# Patient Record
Sex: Female | Born: 1975 | Race: Black or African American | Hispanic: No | Marital: Single | Smoking: Never smoker
Health system: Southern US, Community
[De-identification: ages and names within clinical notes are randomized; demographics above are authoritative.]

## PROBLEM LIST (undated history)

## (undated) DIAGNOSIS — I10 Essential (primary) hypertension: Secondary | ICD-10-CM

## (undated) DIAGNOSIS — O139 Gestational [pregnancy-induced] hypertension without significant proteinuria, unspecified trimester: Secondary | ICD-10-CM

## (undated) DIAGNOSIS — K219 Gastro-esophageal reflux disease without esophagitis: Secondary | ICD-10-CM

## (undated) DIAGNOSIS — I480 Paroxysmal atrial fibrillation: Secondary | ICD-10-CM

## (undated) DIAGNOSIS — R002 Palpitations: Secondary | ICD-10-CM

## (undated) DIAGNOSIS — R569 Unspecified convulsions: Secondary | ICD-10-CM

## (undated) DIAGNOSIS — I712 Thoracic aortic aneurysm, without rupture: Secondary | ICD-10-CM

## (undated) DIAGNOSIS — E876 Hypokalemia: Secondary | ICD-10-CM

## (undated) DIAGNOSIS — E669 Obesity, unspecified: Secondary | ICD-10-CM

## (undated) DIAGNOSIS — D219 Benign neoplasm of connective and other soft tissue, unspecified: Secondary | ICD-10-CM

## (undated) DIAGNOSIS — I7121 Aneurysm of the ascending aorta, without rupture: Secondary | ICD-10-CM

## (undated) DIAGNOSIS — I493 Ventricular premature depolarization: Secondary | ICD-10-CM

## (undated) DIAGNOSIS — IMO0002 Reserved for concepts with insufficient information to code with codable children: Secondary | ICD-10-CM

## (undated) DIAGNOSIS — I7789 Other specified disorders of arteries and arterioles: Secondary | ICD-10-CM

## (undated) DIAGNOSIS — R87619 Unspecified abnormal cytological findings in specimens from cervix uteri: Secondary | ICD-10-CM

## (undated) DIAGNOSIS — D649 Anemia, unspecified: Secondary | ICD-10-CM

## (undated) HISTORY — DX: Palpitations: R00.2

## (undated) HISTORY — DX: Paroxysmal atrial fibrillation: I48.0

## (undated) HISTORY — DX: Other specified disorders of arteries and arterioles: I77.89

## (undated) HISTORY — DX: Unspecified convulsions: R56.9

## (undated) HISTORY — DX: Thoracic aortic aneurysm, without rupture: I71.2

## (undated) HISTORY — PX: COLPOSCOPY: SHX161

## (undated) HISTORY — DX: Ventricular premature depolarization: I49.3

## (undated) HISTORY — DX: Obesity, unspecified: E66.9

## (undated) HISTORY — DX: Gastro-esophageal reflux disease without esophagitis: K21.9

## (undated) HISTORY — DX: Aneurysm of the ascending aorta, without rupture: I71.21

## (undated) HISTORY — DX: Hypokalemia: E87.6

## (undated) HISTORY — PX: TUBAL LIGATION: SHX77

## (undated) HISTORY — PX: OOPHORECTOMY: SHX86

---

## 2000-05-22 ENCOUNTER — Encounter: Admission: RE | Admit: 2000-05-22 | Discharge: 2000-05-31 | Payer: Self-pay | Admitting: *Deleted

## 2001-07-09 ENCOUNTER — Encounter: Admission: RE | Admit: 2001-07-09 | Discharge: 2001-08-09 | Payer: Self-pay | Admitting: *Deleted

## 2003-09-17 ENCOUNTER — Emergency Department (HOSPITAL_COMMUNITY): Admission: EM | Admit: 2003-09-17 | Discharge: 2003-09-18 | Payer: Self-pay | Admitting: Emergency Medicine

## 2003-09-26 ENCOUNTER — Emergency Department (HOSPITAL_COMMUNITY): Admission: EM | Admit: 2003-09-26 | Discharge: 2003-09-26 | Payer: Self-pay | Admitting: Emergency Medicine

## 2003-10-31 ENCOUNTER — Emergency Department (HOSPITAL_COMMUNITY): Admission: EM | Admit: 2003-10-31 | Discharge: 2003-10-31 | Payer: Self-pay | Admitting: Emergency Medicine

## 2004-02-03 ENCOUNTER — Other Ambulatory Visit: Admission: RE | Admit: 2004-02-03 | Discharge: 2004-02-03 | Payer: Self-pay

## 2005-01-01 ENCOUNTER — Emergency Department (HOSPITAL_COMMUNITY): Admission: EM | Admit: 2005-01-01 | Discharge: 2005-01-01 | Payer: Self-pay | Admitting: Emergency Medicine

## 2005-03-03 ENCOUNTER — Emergency Department (HOSPITAL_COMMUNITY): Admission: EM | Admit: 2005-03-03 | Discharge: 2005-03-03 | Payer: Self-pay | Admitting: Emergency Medicine

## 2005-04-24 ENCOUNTER — Emergency Department (HOSPITAL_COMMUNITY): Admission: EM | Admit: 2005-04-24 | Discharge: 2005-04-25 | Payer: Self-pay | Admitting: *Deleted

## 2005-04-25 ENCOUNTER — Ambulatory Visit (HOSPITAL_COMMUNITY): Admission: RE | Admit: 2005-04-25 | Discharge: 2005-04-25 | Payer: Self-pay | Admitting: *Deleted

## 2005-05-20 ENCOUNTER — Inpatient Hospital Stay (HOSPITAL_COMMUNITY): Admission: AD | Admit: 2005-05-20 | Discharge: 2005-05-21 | Payer: Self-pay | Admitting: Obstetrics and Gynecology

## 2005-06-22 ENCOUNTER — Ambulatory Visit: Payer: Self-pay | Admitting: Cardiology

## 2005-07-01 ENCOUNTER — Ambulatory Visit: Payer: Self-pay | Admitting: Cardiology

## 2005-07-06 ENCOUNTER — Observation Stay (HOSPITAL_COMMUNITY): Admission: EM | Admit: 2005-07-06 | Discharge: 2005-07-08 | Payer: Self-pay | Admitting: Emergency Medicine

## 2005-07-08 ENCOUNTER — Ambulatory Visit: Payer: Self-pay | Admitting: Cardiology

## 2005-07-18 ENCOUNTER — Ambulatory Visit: Payer: Self-pay | Admitting: Cardiology

## 2005-07-20 ENCOUNTER — Emergency Department (HOSPITAL_COMMUNITY): Admission: EM | Admit: 2005-07-20 | Discharge: 2005-07-20 | Payer: Self-pay | Admitting: Emergency Medicine

## 2005-08-24 ENCOUNTER — Ambulatory Visit: Payer: Self-pay | Admitting: Cardiology

## 2005-12-27 ENCOUNTER — Ambulatory Visit: Payer: Self-pay | Admitting: Cardiology

## 2006-01-01 ENCOUNTER — Emergency Department (HOSPITAL_COMMUNITY): Admission: EM | Admit: 2006-01-01 | Discharge: 2006-01-01 | Payer: Self-pay | Admitting: Emergency Medicine

## 2006-02-15 ENCOUNTER — Ambulatory Visit: Payer: Self-pay | Admitting: Cardiology

## 2006-06-18 ENCOUNTER — Emergency Department (HOSPITAL_COMMUNITY): Admission: EM | Admit: 2006-06-18 | Discharge: 2006-06-18 | Payer: Self-pay | Admitting: Family Medicine

## 2006-08-30 ENCOUNTER — Emergency Department (HOSPITAL_COMMUNITY): Admission: EM | Admit: 2006-08-30 | Discharge: 2006-08-30 | Payer: Self-pay | Admitting: Emergency Medicine

## 2006-09-17 ENCOUNTER — Emergency Department (HOSPITAL_COMMUNITY): Admission: EM | Admit: 2006-09-17 | Discharge: 2006-09-18 | Payer: Self-pay | Admitting: Emergency Medicine

## 2006-10-18 ENCOUNTER — Emergency Department (HOSPITAL_COMMUNITY): Admission: EM | Admit: 2006-10-18 | Discharge: 2006-10-18 | Payer: Self-pay | Admitting: Emergency Medicine

## 2006-10-20 ENCOUNTER — Emergency Department (HOSPITAL_COMMUNITY): Admission: EM | Admit: 2006-10-20 | Discharge: 2006-10-20 | Payer: Self-pay | Admitting: Emergency Medicine

## 2006-10-22 ENCOUNTER — Emergency Department (HOSPITAL_COMMUNITY): Admission: EM | Admit: 2006-10-22 | Discharge: 2006-10-22 | Payer: Self-pay | Admitting: Emergency Medicine

## 2007-03-06 ENCOUNTER — Emergency Department (HOSPITAL_COMMUNITY): Admission: EM | Admit: 2007-03-06 | Discharge: 2007-03-06 | Payer: Self-pay | Admitting: Emergency Medicine

## 2007-04-14 ENCOUNTER — Emergency Department (HOSPITAL_COMMUNITY): Admission: EM | Admit: 2007-04-14 | Discharge: 2007-04-14 | Payer: Self-pay | Admitting: Family Medicine

## 2007-04-18 ENCOUNTER — Emergency Department (HOSPITAL_COMMUNITY): Admission: EM | Admit: 2007-04-18 | Discharge: 2007-04-18 | Payer: Self-pay | Admitting: Emergency Medicine

## 2007-06-15 ENCOUNTER — Emergency Department (HOSPITAL_COMMUNITY): Admission: EM | Admit: 2007-06-15 | Discharge: 2007-06-15 | Payer: Self-pay | Admitting: Emergency Medicine

## 2008-01-01 ENCOUNTER — Emergency Department (HOSPITAL_COMMUNITY): Admission: EM | Admit: 2008-01-01 | Discharge: 2008-01-02 | Payer: Self-pay | Admitting: Emergency Medicine

## 2008-02-05 ENCOUNTER — Emergency Department (HOSPITAL_COMMUNITY): Admission: EM | Admit: 2008-02-05 | Discharge: 2008-02-05 | Payer: Self-pay | Admitting: Emergency Medicine

## 2008-04-07 ENCOUNTER — Emergency Department (HOSPITAL_COMMUNITY): Admission: EM | Admit: 2008-04-07 | Discharge: 2008-04-07 | Payer: Self-pay | Admitting: Emergency Medicine

## 2008-06-27 ENCOUNTER — Emergency Department (HOSPITAL_COMMUNITY): Admission: EM | Admit: 2008-06-27 | Discharge: 2008-06-27 | Payer: Self-pay | Admitting: Family Medicine

## 2008-10-19 ENCOUNTER — Emergency Department (HOSPITAL_COMMUNITY): Admission: EM | Admit: 2008-10-19 | Discharge: 2008-10-19 | Payer: Self-pay | Admitting: Emergency Medicine

## 2008-11-13 ENCOUNTER — Ambulatory Visit: Payer: Self-pay | Admitting: Cardiology

## 2008-12-09 ENCOUNTER — Ambulatory Visit: Payer: Self-pay | Admitting: Cardiology

## 2008-12-09 LAB — CONVERTED CEMR LAB
BUN: 11 mg/dL (ref 6–23)
CO2: 28 meq/L (ref 19–32)
Calcium: 9.2 mg/dL (ref 8.4–10.5)
Chloride: 104 meq/L (ref 96–112)
Creatinine, Ser: 0.7 mg/dL (ref 0.4–1.2)
GFR calc Af Amer: 126 mL/min
GFR calc non Af Amer: 104 mL/min
Glucose, Bld: 91 mg/dL (ref 70–99)
Potassium: 4 meq/L (ref 3.5–5.1)
Sodium: 139 meq/L (ref 135–145)

## 2009-01-04 ENCOUNTER — Emergency Department (HOSPITAL_COMMUNITY): Admission: EM | Admit: 2009-01-04 | Discharge: 2009-01-04 | Payer: Self-pay | Admitting: Emergency Medicine

## 2009-02-03 ENCOUNTER — Ambulatory Visit: Payer: Self-pay | Admitting: Cardiology

## 2009-02-03 DIAGNOSIS — E876 Hypokalemia: Secondary | ICD-10-CM | POA: Insufficient documentation

## 2009-02-03 DIAGNOSIS — I10 Essential (primary) hypertension: Secondary | ICD-10-CM

## 2009-03-24 ENCOUNTER — Emergency Department (HOSPITAL_COMMUNITY): Admission: EM | Admit: 2009-03-24 | Discharge: 2009-03-24 | Payer: Self-pay | Admitting: Emergency Medicine

## 2009-05-16 ENCOUNTER — Emergency Department (HOSPITAL_COMMUNITY): Admission: EM | Admit: 2009-05-16 | Discharge: 2009-05-17 | Payer: Self-pay | Admitting: Emergency Medicine

## 2009-05-21 ENCOUNTER — Emergency Department (HOSPITAL_COMMUNITY): Admission: EM | Admit: 2009-05-21 | Discharge: 2009-05-22 | Payer: Self-pay | Admitting: Neurology

## 2009-07-13 ENCOUNTER — Inpatient Hospital Stay (HOSPITAL_COMMUNITY): Admission: AD | Admit: 2009-07-13 | Discharge: 2009-07-13 | Payer: Self-pay | Admitting: Obstetrics and Gynecology

## 2009-07-27 ENCOUNTER — Emergency Department (HOSPITAL_COMMUNITY): Admission: EM | Admit: 2009-07-27 | Discharge: 2009-07-27 | Payer: Self-pay | Admitting: Family Medicine

## 2010-02-27 ENCOUNTER — Emergency Department (HOSPITAL_COMMUNITY): Admission: EM | Admit: 2010-02-27 | Discharge: 2010-02-27 | Payer: Self-pay | Admitting: Family Medicine

## 2010-03-12 ENCOUNTER — Telehealth: Payer: Self-pay | Admitting: Cardiology

## 2010-04-08 ENCOUNTER — Emergency Department (HOSPITAL_COMMUNITY): Admission: EM | Admit: 2010-04-08 | Discharge: 2010-04-08 | Payer: Self-pay | Admitting: Family Medicine

## 2010-04-22 ENCOUNTER — Encounter (INDEPENDENT_AMBULATORY_CARE_PROVIDER_SITE_OTHER): Payer: Self-pay | Admitting: *Deleted

## 2010-05-14 ENCOUNTER — Emergency Department (HOSPITAL_COMMUNITY): Admission: EM | Admit: 2010-05-14 | Discharge: 2010-05-14 | Payer: Self-pay | Admitting: Emergency Medicine

## 2010-05-17 ENCOUNTER — Emergency Department (HOSPITAL_COMMUNITY): Admission: EM | Admit: 2010-05-17 | Discharge: 2010-05-17 | Payer: Self-pay | Admitting: Emergency Medicine

## 2010-08-05 ENCOUNTER — Emergency Department (HOSPITAL_COMMUNITY): Admission: EM | Admit: 2010-08-05 | Discharge: 2010-08-05 | Payer: Self-pay | Admitting: Family Medicine

## 2010-09-06 ENCOUNTER — Emergency Department (HOSPITAL_COMMUNITY)
Admission: EM | Admit: 2010-09-06 | Discharge: 2010-09-06 | Payer: Self-pay | Source: Home / Self Care | Admitting: Emergency Medicine

## 2010-09-08 ENCOUNTER — Emergency Department (HOSPITAL_COMMUNITY)
Admission: EM | Admit: 2010-09-08 | Discharge: 2010-09-08 | Payer: Self-pay | Source: Home / Self Care | Admitting: Emergency Medicine

## 2010-09-20 ENCOUNTER — Inpatient Hospital Stay (HOSPITAL_COMMUNITY)
Admission: AD | Admit: 2010-09-20 | Discharge: 2010-09-20 | Payer: Self-pay | Source: Home / Self Care | Attending: Obstetrics & Gynecology | Admitting: Obstetrics & Gynecology

## 2010-10-06 ENCOUNTER — Ambulatory Visit: Payer: Self-pay | Admitting: Obstetrics & Gynecology

## 2010-10-27 ENCOUNTER — Ambulatory Visit: Admit: 2010-10-27 | Payer: Self-pay | Admitting: Obstetrics and Gynecology

## 2010-11-07 LAB — CONVERTED CEMR LAB
ALT: 17 units/L (ref 0–35)
AST: 19 units/L (ref 0–37)
Albumin: 3.5 g/dL (ref 3.5–5.2)
Alkaline Phosphatase: 59 units/L (ref 39–117)
BUN: 8 mg/dL (ref 6–23)
Bilirubin, Direct: 0.1 mg/dL (ref 0.0–0.3)
CO2: 0 meq/L — CL (ref 19–32)
Calcium: 8.8 mg/dL (ref 8.4–10.5)
Chloride: 105 meq/L (ref 96–112)
Cholesterol: 170 mg/dL (ref 0–200)
Creatinine, Ser: 0.7 mg/dL (ref 0.4–1.2)
GFR calc Af Amer: 126 mL/min
GFR calc non Af Amer: 104 mL/min
Glucose, Bld: 87 mg/dL (ref 70–99)
HDL: 41.1 mg/dL (ref >39.0)
Hgb A1c MFr Bld: 4.8 % (ref 4.6–6.0)
LDL Cholesterol: 120 mg/dL — ABNORMAL HIGH (ref 0–99)
Potassium: 4.1 meq/L (ref 3.5–5.1)
Sodium: 139 meq/L (ref 135–145)
TSH: 1.3 microintl units/mL (ref 0.35–5.50)
Total Bilirubin: 0.4 mg/dL (ref 0.3–1.2)
Total CHOL/HDL Ratio: 4.1
Total Protein: 7.9 g/dL (ref 6.0–8.3)
Triglycerides: 46 mg/dL (ref 0–149)
VLDL: 9 mg/dL (ref 0–40)

## 2010-11-09 NOTE — Progress Notes (Signed)
Summary: refill  / Pt is out medication  Phone Note Refill Request Message from:  Patient on March 12, 2010 12:52 PM  Refills Requested: Medication #1:  VERAPAMIL HCL CR 180 MG CR-TABS Take 2 tablet by mouth once a day.  Medication #2:  SPIRONOLACTONE 50 MG TABS Take 1 tablet by mouth once a day  Medication #3:  POTASSIUM CHLORIDE CRYS CR 20 MEQ CR-TABS Take 1 tablet by mouth three times a day [facility] [phone]  Initial call taken by: Judie Grieve,  March 12, 2010 12:53 PM    Prescriptions: VERAPAMIL HCL CR 180 MG CR-TABS (VERAPAMIL HCL) Take 2 tablet by mouth once a day  #60 x 11   Entered by:   Danielle Rankin, CMA   Authorized by:   Gaylord Shih, MD, [facility]   Signed by:   Danielle Rankin, CMA on 03/12/2010   Method used:   Electronically to        [facility] [address] [phone]* (retail)       [address]       [place], [place]  [address]       Ph: [phone] or [phone]       Fax: [phone]   RxID:   [id number] SPIRONOLACTONE 50 MG TABS (SPIRONOLACTONE) Take 1 tablet by mouth once a day  #30 x 11   Entered by:   Danielle Rankin, CMA   Authorized by:   Gaylord Shih, MD, [facility]   Signed by:   Danielle Rankin, CMA on 03/12/2010   Method used:   Electronically to        [facility] [address] [phone]* (retail)       [address]       [place], [place]  [address]       Ph: [phone] or [phone]       Fax: [phone]   RxID:   [id number] POTASSIUM CHLORIDE CRYS CR 20 MEQ CR-TABS (POTASSIUM CHLORIDE CRYS CR) Take 1 tablet by mouth three times a day  #90 x 11   Entered by:   Danielle Rankin, CMA   Authorized by:   Gaylord Shih, MD, [facility]   Signed by:   Danielle Rankin, CMA on 03/12/2010   Method used:   Electronically to        [facility] [address] [phone]* (retail)       [address]       [place], [place]  [address]       Ph: [phone] or [phone]       Fax: [phone]   RxID:    [id number]

## 2010-11-09 NOTE — Letter (Signed)
Summary: Appointment - Missed   Cardiology     [place], [place]    Phone:   Fax:      April 22, 2010 MRN: [id number]   SUEELLEN KAYES [address] [place], [place]  [address]   Dear Ms. [facility],  Our records indicate you missed your appointment on  04/20/2010  with Dr.Wall       .                                    It is very important that we reach you to reschedule this appointment. We look forward to participating in your health care needs. Please contact us at the number listed above at your earliest convenience to reschedule this appointment.     Sincerely,   Lorne Skeens  [facility] Scheduling Team

## 2010-12-02 ENCOUNTER — Inpatient Hospital Stay (INDEPENDENT_AMBULATORY_CARE_PROVIDER_SITE_OTHER)
Admission: RE | Admit: 2010-12-02 | Discharge: 2010-12-02 | Disposition: A | Discharge status: Home / Self Care | Payer: Self-pay | Source: Ambulatory Visit | Attending: Family Medicine | Admitting: Family Medicine

## 2010-12-02 DIAGNOSIS — K047 Periapical abscess without sinus: Secondary | ICD-10-CM

## 2010-12-20 ENCOUNTER — Encounter: Payer: Self-pay | Admitting: Family Medicine

## 2010-12-20 ENCOUNTER — Other Ambulatory Visit: Payer: Self-pay | Admitting: Family Medicine

## 2010-12-20 ENCOUNTER — Encounter (INDEPENDENT_AMBULATORY_CARE_PROVIDER_SITE_OTHER): Payer: Self-pay | Admitting: Family Medicine

## 2010-12-20 ENCOUNTER — Other Ambulatory Visit (HOSPITAL_COMMUNITY)
Admission: RE | Admit: 2010-12-20 | Discharge: 2010-12-20 | Disposition: A | Discharge status: Home / Self Care | Payer: BC Managed Care – PPO | Source: Ambulatory Visit | Attending: Family Medicine | Admitting: Family Medicine

## 2010-12-20 DIAGNOSIS — N949 Unspecified condition associated with female genital organs and menstrual cycle: Secondary | ICD-10-CM

## 2010-12-20 DIAGNOSIS — N938 Other specified abnormal uterine and vaginal bleeding: Secondary | ICD-10-CM | POA: Insufficient documentation

## 2010-12-20 DIAGNOSIS — D259 Leiomyoma of uterus, unspecified: Secondary | ICD-10-CM

## 2010-12-20 LAB — CBC
HCT: 40.3 % (ref 36.0–46.0)
Hemoglobin: 13.3 g/dL (ref 12.0–15.0)
MCH: 30.7 pg (ref 26.0–34.0)
MCHC: 33 g/dL (ref 30.0–36.0)
MCV: 93.1 fL (ref 78.0–100.0)
Platelets: 300 10*3/uL (ref 150–400)
RBC: 4.33 MIL/uL (ref 3.87–5.11)
RDW: 13.1 % (ref 11.5–15.5)
WBC: 10.7 10*3/uL — ABNORMAL HIGH (ref 4.0–10.5)

## 2010-12-20 LAB — CONVERTED CEMR LAB
HCT: 38.1 % (ref 36.0–46.0)
Hemoglobin: 12.4 g/dL (ref 12.0–15.0)
LH: 31.4 milliintl units/mL
MCV: 91.6 fL (ref 78.0–100.0)
RDW: 12.9 % (ref 11.5–15.5)
Testosterone-% Free: 2 % (ref 0.4–2.4)

## 2010-12-20 LAB — WET PREP, GENITAL

## 2010-12-20 LAB — GC/CHLAMYDIA PROBE AMP, GENITAL: Chlamydia, DNA Probe: NEGATIVE

## 2010-12-20 LAB — SAMPLE TO BLOOD BANK

## 2010-12-21 LAB — COMPREHENSIVE METABOLIC PANEL
ALT: 16 U/L (ref 0–35)
Alkaline Phosphatase: 59 U/L (ref 39–117)
BUN: 13 mg/dL (ref 6–23)
CO2: 28 mEq/L (ref 19–32)
Calcium: 9.4 mg/dL (ref 8.4–10.5)
GFR calc non Af Amer: >60 mL/min (ref >60)
Glucose, Bld: 92 mg/dL (ref 70–99)
Potassium: 4.4 mEq/L (ref 3.5–5.1)
Sodium: 139 mEq/L (ref 135–145)
Total Protein: 7.3 g/dL (ref 6.0–8.3)

## 2010-12-21 LAB — CBC
HCT: 38.9 % (ref 36.0–46.0)
Hemoglobin: 12.7 g/dL (ref 12.0–15.0)
MCHC: 32.6 g/dL (ref 30.0–36.0)
MCV: 91.7 fL (ref 78.0–100.0)
RDW: 12.7 % (ref 11.5–15.5)

## 2010-12-21 LAB — DIFFERENTIAL
Basophils Absolute: 0 10*3/uL (ref 0.0–0.1)
Basophils Relative: 0 % (ref 0–1)
Eosinophils Absolute: 0.1 10*3/uL (ref 0.0–0.7)
Neutro Abs: 5.5 10*3/uL (ref 1.7–7.7)
Neutrophils Relative %: 61 % (ref 43–77)

## 2010-12-21 LAB — URINALYSIS, ROUTINE W REFLEX MICROSCOPIC
Glucose, UA: NEGATIVE mg/dL
Ketones, ur: NEGATIVE mg/dL
Nitrite: NEGATIVE
Protein, ur: NEGATIVE mg/dL
Protein, ur: NEGATIVE mg/dL
Specific Gravity, Urine: 1.018 (ref 1.005–1.030)
Urobilinogen, UA: 1 mg/dL (ref 0.0–1.0)
Urobilinogen, UA: 1 mg/dL (ref 0.0–1.0)

## 2010-12-21 LAB — URINE MICROSCOPIC-ADD ON

## 2010-12-21 LAB — LIPASE, BLOOD: Lipase: 21 U/L (ref 11–59)

## 2010-12-21 LAB — POCT PREGNANCY, URINE
Preg Test, Ur: NEGATIVE
Preg Test, Ur: NEGATIVE

## 2010-12-22 LAB — POCT I-STAT, CHEM 8
Calcium, Ion: 1.13 mmol/L (ref 1.12–1.32)
Chloride: 103 mEq/L (ref 96–112)
Glucose, Bld: 121 mg/dL — ABNORMAL HIGH (ref 70–99)
HCT: 42 % (ref 36.0–46.0)
Hemoglobin: 14.3 g/dL (ref 12.0–15.0)
TCO2: 27 mmol/L (ref 0–100)

## 2010-12-22 LAB — POCT PREGNANCY, URINE
Preg Test, Ur: NEGATIVE
Preg Test, Ur: NEGATIVE

## 2010-12-22 LAB — POCT URINALYSIS DIPSTICK
Nitrite: NEGATIVE
Protein, ur: NEGATIVE mg/dL
Urobilinogen, UA: 0.2 mg/dL (ref 0.0–1.0)
pH: 5 (ref 5.0–8.0)

## 2010-12-31 NOTE — Progress Notes (Signed)
Tammie [name], Tammie [name] NO.:  [id number]  MEDICAL RECORD NO.:  [id number]           PATIENT TYPE:  A  LOCATION:  [facility]                   FACILITY:  [facility]  PHYSICIAN:  Maryelizabeth Kaufmann, MD  DATE OF BIRTH:  1976-05-24  DATE OF SERVICE:  12/20/2010                                 CLINIC NOTE  CHIEF COMPLAINT:  Followup from [facility] for fibroid.  HISTORY OF PRESENT ILLNESS:  This is a 35 year old gravida 2, para 1-1-0- 2 who presents with history of uterine fibroids who present to the followup from [facility] for dysmenorrhea and dysfunctional uterine bleeding. She states that she has abdominal pain occasionally that is 10/10 where she takes ibuprofen with minimal relief.  Denies any fevers, chills, nausea, or vomiting.  She denies any new medication.  She does note some weight gain over the past couple of months with some possible bloating. She states she has also gained about 10 pounds in 2 weeks.  She also states that her period seems to be regular and in about a couple of months ago.  She states that they occur at irregular intervals about twice a month and they last about 8-10 days.  PAST MEDICAL HISTORY:  As notable for hypertension.  PAST SURGICAL HISTORY:  Notable for bilateral tubal ligation in 2000.  GYN HISTORY:  She states she had menarche at age 35.  There were initially regular every 25 minutes 28 days, lasting about 2-3 days. Now, she thinks she has irregular menses about twice a month lasting about 8-10 days.  She does have a history of sexually transmitted disease which includes trichomonas and Chlamydia about 3 years ago.  OBSTETRICAL HISTORY:  She is a G2, P1-1-0-2, status post STD x2.  She delivered a boy at 4 pounds, 12 ounces, 2 months early, otherwise without any complications.  Next, she delivered a term female 8 pounds and 3 ounces, otherwise she denies any complications or problems with the pregnancy.  SOCIAL HISTORY:  No tobacco, alcohol, or  drugs.  REVIEW OF SYSTEMS:  Other review of systems is notable for pain with intercourse and abdominal pain and some leg cramps.  FAMILY HISTORY:  Notable for diabetes in a great aunt.  Heart disease notable for MI in her grandmother.  High blood pressure in father and grandmother and had a ovarian cancer.  PHYSICAL EXAMINATION:  VITAL SIGNS:  Blood pressure 154/86, pulse 85, temperature 98.5, weight 290.7, and height 59-1/2. GENERAL:  The patient is in no acute distress.  Alert and oriented x4. LUNGS:  Clear to auscultation bilaterally.  No wheezes, rales, or rhonchi. CARDIAC:  Regular rate and rhythm.  No murmurs, rubs, or gallops. ABDOMEN:  Positive bowel sounds.  Soft.  Left lower quadrant suprapubic and right lower quadrant tenderness to palpation. EXTREMITIES:  No clubbing, cyanosis, or edema. GU:  External genitalia appeared to be normal.  Sterile speculum exam, normal.  Vaginal mucosa, normal rugae.  Cervix appeared to be parous, but normal.  No lesions were visible.  Pap smear was performed.  Cervix was then cleaned with Betadine and the suction curette was then inserted at 10 mL to the fundus with 2 passes was  obtained in her tissue.  There was not bleeding.  There were no complications and endometrial biopsy was subsequently concluded.  Speculum was then removed.  Bimanual exam approximately at 6-8 week size uterus was difficult to palpate for fibroids due to size, otherwise adnexa was nontender with no masses.  ASSESSMENT AND PLAN:  This is a 35 year old gravidaida 2, para 1-1-0-2 with, 1. Uterine fibroids. 2. Dysfunctional uterine bleeding.  We will try cyclic Provera.  The     patient is status post __________ for that.  We will also check     labs for possible PCOS as an etiology for dysfunctional uterine     bleeding.  The patient does not do well with this.  Provera, I did     possibly discuss the option of surgery, so she maybe inclined to do     that as well.  She  does have a history of hypertension which seems     to be very well controlled at least today with a blood pressure of     154/86, so we will follow up in that regards as well.  We will have     the patient to follow up in 4 weeks for followup and for her     results.          ______________________________ Maryelizabeth Kaufmann, MD    LC/MEDQ  D:  12/20/2010  T:  12/21/2010  Job:  [id number]

## 2011-01-09 ENCOUNTER — Emergency Department (HOSPITAL_COMMUNITY)
Admission: EM | Admit: 2011-01-09 | Discharge: 2011-01-09 | Discharge status: Against Medical Advice | Payer: BC Managed Care – PPO | Attending: Emergency Medicine | Admitting: Emergency Medicine

## 2011-01-09 DIAGNOSIS — R42 Dizziness and giddiness: Secondary | ICD-10-CM | POA: Insufficient documentation

## 2011-01-13 LAB — URINALYSIS, ROUTINE W REFLEX MICROSCOPIC
Bilirubin Urine: NEGATIVE
Glucose, UA: NEGATIVE mg/dL
Nitrite: NEGATIVE
Specific Gravity, Urine: 1.02 (ref 1.005–1.030)
pH: 7 (ref 5.0–8.0)

## 2011-01-13 LAB — POCT PREGNANCY, URINE: Preg Test, Ur: NEGATIVE

## 2011-01-13 LAB — GC/CHLAMYDIA PROBE AMP, GENITAL: GC Probe Amp, Genital: NEGATIVE

## 2011-01-13 LAB — WET PREP, GENITAL: Trich, Wet Prep: NONE SEEN

## 2011-01-15 LAB — CBC
HCT: 38.3 % (ref 36.0–46.0)
Hemoglobin: 12.8 g/dL (ref 12.0–15.0)
MCHC: 33.3 g/dL (ref 30.0–36.0)
RDW: 12.6 % (ref 11.5–15.5)

## 2011-01-15 LAB — DIFFERENTIAL
Basophils Absolute: 0.1 10*3/uL (ref 0.0–0.1)
Basophils Relative: 1 % (ref 0–1)
Eosinophils Relative: 1 % (ref 0–5)
Monocytes Absolute: 0.7 10*3/uL (ref 0.1–1.0)

## 2011-01-15 LAB — BASIC METABOLIC PANEL
CO2: 28 mEq/L (ref 19–32)
Glucose, Bld: 78 mg/dL (ref 70–99)
Potassium: 3.7 mEq/L (ref 3.5–5.1)
Sodium: 137 mEq/L (ref 135–145)

## 2011-01-17 ENCOUNTER — Ambulatory Visit (INDEPENDENT_AMBULATORY_CARE_PROVIDER_SITE_OTHER): Payer: BC Managed Care – PPO | Admitting: Family Medicine

## 2011-01-17 DIAGNOSIS — D259 Leiomyoma of uterus, unspecified: Secondary | ICD-10-CM

## 2011-01-17 LAB — URINALYSIS, ROUTINE W REFLEX MICROSCOPIC
Bilirubin Urine: NEGATIVE
Glucose, UA: NEGATIVE mg/dL
Ketones, ur: NEGATIVE mg/dL
Leukocytes, UA: NEGATIVE
Nitrite: NEGATIVE
Protein, ur: NEGATIVE mg/dL
Specific Gravity, Urine: 1.027 (ref 1.005–1.030)
Urobilinogen, UA: 1 mg/dL (ref 0.0–1.0)
pH: 7.5 (ref 5.0–8.0)

## 2011-01-17 LAB — POCT I-STAT, CHEM 8
BUN: 14 mg/dL (ref 6–23)
Calcium, Ion: 1.12 mmol/L (ref 1.12–1.32)
Chloride: 106 mEq/L (ref 96–112)
Glucose, Bld: 100 mg/dL — ABNORMAL HIGH (ref 70–99)
HCT: 40 % (ref 36.0–46.0)
Potassium: 4.4 mEq/L (ref 3.5–5.1)

## 2011-01-17 LAB — URINE MICROSCOPIC-ADD ON

## 2011-01-17 NOTE — H&P (Signed)
Tammie [name], Tammie [name].:  [id number]  MEDICAL RECORD [name].:  [id number]           PATIENT TYPE:  A  LOCATION:  [facility]                   FACILITY:  [facility]  PHYSICIAN:  Scheryl Darter, MD       DATE OF BIRTH:  19-Jun-1976  DATE OF SERVICE:                          PRE-OP HISTORY & PHYSICAL  CHIEF COMPLAINT:  Pelvic pain heavy.  DIAGNOSIS:  Exophytic uterine fibroid and menorrhagia.  PLANNED PROCEDURE:  Laparoscopic myomectomy.  HISTORY OF PRESENT ILLNESS:  The patient is a 35 year old black female gravida 2 para 1-1-0-2, last menstrual period December 11, 2010.  The patient for the last several months has had heavier than usual periods with dysmenorrhea and periods lasting about 8-10 days.  Ultrasound shows a 7.5 x 8 cm exophytic fibroid.  This was confirmed by MRI.  She states she had some weight gain in the last couple of months with some bloating.  PAST MEDICAL HISTORY: 1. Morbid obesity. 2. Hypertension.  PAST SURGICAL HISTORY:  Postpartum bilateral tubal ligation in 2000 at age of 22.  SOCIAL HISTORY:  The patient is single.  She denies alcohol, tobacco or drug use.  PAST GYN HISTORY:  The patient has had Trichomonas and Chlamydia in the past.  PAST OB HISTORY:  The patient had two vaginal deliveries, one preterm and one full-term.  MEDICATIONS: 1. Potassium 20 mEq p.o. t.i.d. 2. Spironolactone 20 mg p.o. daily. 3. Verapamil b.i.d.  REVIEW OF SYSTEMS:  The patient denies any abnormal discharge or urinary symptoms.  [name] today.  She says she was unable to tolerate taking cyclic Provera.  PHYSICAL EXAMINATION:  GENERAL:  The patient in [name] acute distress. VITAL SIGNS:  Weight is 286 pounds, height 69.5 inches, blood pressure 142/89, pulse 93, temperature 98.4. CHEST:  Clear. HEART:  Regular rate and rhythm. ABDOMEN:  Soft with some left lower quadrant suprapubic and right lower quadrant tenderness. PELVIC:  At that time, she had normal  external genitalia.  Cervix appeared normal.  Uterus appeared to be 6-8 week size and endometrial biopsy was done on that day but that was normal.  Pelvic ultrasound showed a 9.1 x 5.2 x 5.9-cm homogeneous uterine fundus with a 7.4 x 6.6 cm anterior fibroid that was not definitely contagious with the uterine myometrium.  On MRI, a 7.5 x 8 cm fibroid was seen that was exophytic to the uterus.  Pap test done in March was negative.  IMPRESSION: 1. Exophytic fibroid. 2. Menorrhagia.  PLAN:  The patient wants to continue with expectant management as far as her menorrhagia but she would like to have the fibroid removed.  We discussed laparoscopic myomectomy.  The risk of laparotomy was discussed.  Risk of bleeding, infection, lower urinary tract or anesthesia complications were discussed.  Questions were answered.  She would like to schedule this as an outpatient.  She will see her primary care doctor before surgery to see if she wants to adjust her blood pressure medications.     Scheryl Darter, MD    JA/MEDQ  D:  01/17/2011  T:  01/17/2011  Job:  [id number]

## 2011-01-20 LAB — COMPREHENSIVE METABOLIC PANEL
Alkaline Phosphatase: 62 U/L (ref 39–117)
BUN: 12 mg/dL (ref 6–23)
Chloride: 103 mEq/L (ref 96–112)
Creatinine, Ser: 0.88 mg/dL (ref 0.4–1.2)
GFR calc non Af Amer: >60 mL/min (ref >60)
Glucose, Bld: 95 mg/dL (ref 70–99)
Potassium: 3.5 mEq/L (ref 3.5–5.1)
Total Bilirubin: 0.3 mg/dL (ref 0.3–1.2)

## 2011-01-20 LAB — URINALYSIS, ROUTINE W REFLEX MICROSCOPIC
Bilirubin Urine: NEGATIVE
Nitrite: NEGATIVE
Specific Gravity, Urine: 1.023 (ref 1.005–1.030)
Urobilinogen, UA: 2 mg/dL — ABNORMAL HIGH (ref 0.0–1.0)
pH: 7.5 (ref 5.0–8.0)

## 2011-01-20 LAB — DIFFERENTIAL
Basophils Absolute: 0.1 10*3/uL (ref 0.0–0.1)
Basophils Relative: 1 % (ref 0–1)
Lymphocytes Relative: 32 % (ref 12–46)
Neutro Abs: 6.1 10*3/uL (ref 1.7–7.7)
Neutrophils Relative %: 61 % (ref 43–77)

## 2011-01-20 LAB — CBC
HCT: 36.9 % (ref 36.0–46.0)
Hemoglobin: 12.4 g/dL (ref 12.0–15.0)
MCV: 92.9 fL (ref 78.0–100.0)
Platelets: 244 10*3/uL (ref 150–400)
WBC: 10.1 10*3/uL (ref 4.0–10.5)

## 2011-01-20 LAB — URINE MICROSCOPIC-ADD ON

## 2011-01-20 LAB — POCT CARDIAC MARKERS
CKMB, poc: 1.3 ng/mL (ref 1.0–8.0)
CKMB, poc: 1.5 ng/mL (ref 1.0–8.0)
Myoglobin, poc: 69.3 ng/mL (ref 12–200)
Troponin i, poc: <0.05 ng/mL (ref 0.00–0.09)

## 2011-01-24 LAB — CBC
MCHC: 32.8 g/dL (ref 30.0–36.0)
RBC: 4.15 MIL/uL (ref 3.87–5.11)

## 2011-01-24 LAB — POCT I-STAT, CHEM 8
BUN: 7 mg/dL (ref 6–23)
Chloride: 106 mEq/L (ref 96–112)
Creatinine, Ser: 0.9 mg/dL (ref 0.4–1.2)
Glucose, Bld: 84 mg/dL (ref 70–99)
Potassium: 3.6 mEq/L (ref 3.5–5.1)

## 2011-01-24 LAB — URINALYSIS, ROUTINE W REFLEX MICROSCOPIC
Leukocytes, UA: NEGATIVE
Protein, ur: NEGATIVE mg/dL
Urobilinogen, UA: 0.2 mg/dL (ref 0.0–1.0)

## 2011-01-24 LAB — DIFFERENTIAL
Basophils Absolute: 0 10*3/uL (ref 0.0–0.1)
Basophils Relative: 0 % (ref 0–1)
Lymphocytes Relative: 27 % (ref 12–46)
Monocytes Relative: 4 % (ref 3–12)
Neutro Abs: 7.1 10*3/uL (ref 1.7–7.7)
Neutrophils Relative %: 68 % (ref 43–77)

## 2011-01-24 LAB — POCT CARDIAC MARKERS
CKMB, poc: 1 ng/mL (ref 1.0–8.0)
Myoglobin, poc: 90.6 ng/mL (ref 12–200)
Troponin i, poc: <0.05 ng/mL (ref 0.00–0.09)

## 2011-01-24 LAB — URINE MICROSCOPIC-ADD ON

## 2011-02-12 ENCOUNTER — Emergency Department (HOSPITAL_COMMUNITY)
Admission: EM | Admit: 2011-02-12 | Discharge: 2011-02-12 | Disposition: A | Discharge status: Home / Self Care | Payer: BC Managed Care – PPO | Attending: Emergency Medicine | Admitting: Emergency Medicine

## 2011-02-12 DIAGNOSIS — R42 Dizziness and giddiness: Secondary | ICD-10-CM | POA: Insufficient documentation

## 2011-02-12 DIAGNOSIS — I10 Essential (primary) hypertension: Secondary | ICD-10-CM | POA: Insufficient documentation

## 2011-02-12 DIAGNOSIS — R000 Tachycardia, unspecified: Secondary | ICD-10-CM | POA: Insufficient documentation

## 2011-02-12 DIAGNOSIS — R002 Palpitations: Secondary | ICD-10-CM | POA: Insufficient documentation

## 2011-02-12 DIAGNOSIS — Z79899 Other long term (current) drug therapy: Secondary | ICD-10-CM | POA: Insufficient documentation

## 2011-02-12 DIAGNOSIS — E669 Obesity, unspecified: Secondary | ICD-10-CM | POA: Insufficient documentation

## 2011-02-12 LAB — URINE MICROSCOPIC-ADD ON

## 2011-02-12 LAB — POCT I-STAT, CHEM 8
Creatinine, Ser: 0.9 mg/dL (ref 0.4–1.2)
Glucose, Bld: 108 mg/dL — ABNORMAL HIGH (ref 70–99)
Hemoglobin: 13.3 g/dL (ref 12.0–15.0)
Potassium: 3.7 mEq/L (ref 3.5–5.1)
TCO2: 27 mmol/L (ref 0–100)

## 2011-02-12 LAB — URINALYSIS, ROUTINE W REFLEX MICROSCOPIC
Glucose, UA: NEGATIVE mg/dL
Protein, ur: 30 mg/dL — AB

## 2011-02-13 LAB — URINE CULTURE: Culture  Setup Time: 201205051749

## 2011-02-17 ENCOUNTER — Other Ambulatory Visit: Payer: Self-pay | Admitting: Obstetrics & Gynecology

## 2011-02-17 ENCOUNTER — Encounter (HOSPITAL_COMMUNITY): Payer: BC Managed Care – PPO

## 2011-02-17 LAB — BASIC METABOLIC PANEL
BUN: 12 mg/dL (ref 6–23)
Calcium: 9.3 mg/dL (ref 8.4–10.5)
Creatinine, Ser: 0.62 mg/dL (ref 0.4–1.2)
GFR calc non Af Amer: >60 mL/min (ref >60)
Glucose, Bld: 88 mg/dL (ref 70–99)

## 2011-02-17 LAB — CBC
HCT: 38.5 % (ref 36.0–46.0)
MCHC: 31.7 g/dL (ref 30.0–36.0)
MCV: 91.2 fL (ref 78.0–100.0)
Platelets: 267 10*3/uL (ref 150–400)
RDW: 13 % (ref 11.5–15.5)

## 2011-02-17 LAB — SURGICAL PCR SCREEN: Staphylococcus aureus: NEGATIVE

## 2011-02-22 NOTE — Assessment & Plan Note (Signed)
[facility]                            CARDIOLOGY OFFICE NOTE   TONIA, AVINO                    MRN:          [id number]  DATE:11/13/2008                            DOB:          November 26, 1975    Ms. Dishman returns today after a nearly 3-year absence from the  practice.   She has a history of the following,  1. Hypertension.  2. Persistent hypokalemia.  3. Morbid obesity.   Please see my previous notes.  She has had secondary evaluation for  hypertension which was negative.  We have had her on Cardizem 180  b.i.d., HCTZ 25 mg daily, and potassium 40 mEq t.i.d..  We could not  keep her potassium up even with that kind of supplementation.  We hence  for changed her spironolactone 25 mg p.o. b.i.d.   She offers no complaints today.  She specifically denies any headaches,  symptoms of TIAs, or stroke, orthopnea, PND, does have some peripheral  edema however.  She has had no tachy palpitations or syncope.  She  denies any chest pain.  She does have some mild dyspnea on exertion.   She was unaware that she has gained as much weight she has.  She has  gone from 255 in March 2007 to 288.   She seems to be fairly compliant with medications.  She is currently on  Cardizem 180 b.i.d., potassium 20 mg t.i.d., spironolactone 25 mg p.o.  b.i.d.   She brings her bottles a day and they do have medicines in them.   PHYSICAL EXAMINATION:  VITAL SIGNS:  Her blood pressure is 130/98, her  pulse is 80 and regular, her weight is 288.  HEENT:  Totally normal.  NECK:  Supple.  It is difficult to assess JVD with a thickness of her  neck.  Carotid upstrokes were equal bilaterally without bruits.  No  thyromegaly and trachea is midline.  CHEST:  Lungs are clear to auscultation and percussion.  HEART:  Reveals a poorly appreciated PMI.  She has large breast.  Normal  S1 and S2, S2 splits.  There is no gallop.  ABDOMEN:  Obese.  There is no obvious midline  bruit.  Organomegaly  cannot not be adequately assessed.  EXTREMITIES:  Reveal 1 to 2+ pretibial edema, right worse than left.  There is no sign of DVT.  Pulses are intact.  NEURO:  Intact.  SKIN:  Clear.   EKG today shows sinus rhythm.  No EKG changes.   ASSESSMENT/PLAN:  I have had about 28-30 minute discussion with Ms.  Millman today.  She clearly needs to get on the wagon and take better  care of herself with good therapeutic lifestyle choices.  I have  recommend the following changes,  1. Walking 3 hours a week.  2. Change spironolactone to 50 mg p.o. q.a.m.  3. Check basic metabolic panel, hemoglobin A1c, liver function tests,      and lipids today.  She has fasted.  4. Change Cardizem to 360 mg q.a.m.  5. Avoid fast foods.  She is eating fast foods 3-4 times a week.  6. Avoid salt as much as possible.   PLAN:  I will see her back again in about 2-3 weeks.  We probably going  to have to be more aggressive with her antihypertensive program.  She  was made aware that today.  Hopefully, she is not a diabetic.     Thomas C. Daleen Squibb, MD, [facility]  Electronically Signed    TCW/MedQ  DD: 11/13/2008  DT: 11/14/2008  Job #: [id number]   cc:   Samuel Jester

## 2011-02-22 NOTE — Assessment & Plan Note (Signed)
[facility]                            CARDIOLOGY OFFICE NOTE   ANDRIAN, URBACH                    MRN:          [id number]  DATE:12/09/2008                            DOB:          Oct 23, 1975    Tammie [name] is coming to the office today for followup.   She has not been able to afford her Cardizem.  It is [facility]$ [facility]  She says it is even worse at [facility].   We does some research today on verapamil sustained release will cost  about 57$ dollars a month.  We will start her on 360 mg daily.   She continues on spironolactone 25 mg p.o. b.i.d. and potassium 20 mEq  t.i.d. for history of hypokalemia.  She has lost 4 pounds.   Her blood work was remarkable and her potassium was 4.1 and creatinine  was 0.7.  Blood sugar was normal.  Hemoglobin A1c was normal.  LFTs were  normal.  Cholesterol was 170, triglycerides 46, HDL 41, VLDL was only 9,  and LDL was 120.  TSH was normal.   She denies any lower extremity edema.   PHYSICAL EXAMINATION:  VITAL SIGNS:  Her blood pressure today is higher  than it was last time.  It is 158/80.  Her pulse is 88 and regular.  Her  weight is 285, down 4.  HEENT:  Unchanged.  LUNGS:  Clear to auscultation and percussion.  HEART:  A poorly appreciated PMI.  Normal S1 and S2.  No S4.  ABDOMEN:  Soft.  No pulsatile mass.  No tenderness.  EXTREMITIES:  No cyanosis, clubbing, or edema.  Pulses are intact.  NEUROLOGIC:  Intact.   I had a long talk with [name] today.  We will change her Cardizem  over verapamil sustained release 360 mg daily to save her about 120$ a  month.  I have cut her potassium down to 20 b.i.d.  We will check a BMET  today.  I will see her back in about 3-4 weeks.     Thomas C. Daleen Squibb, MD, [facility]  Electronically Signed    TCW/MedQ  DD: 12/09/2008  DT: 12/10/2008  Job #: [id number]

## 2011-02-24 ENCOUNTER — Other Ambulatory Visit: Payer: Self-pay | Admitting: Obstetrics and Gynecology

## 2011-02-24 ENCOUNTER — Ambulatory Visit (HOSPITAL_COMMUNITY)
Admission: RE | Admit: 2011-02-24 | Discharge: 2011-02-24 | Disposition: A | Discharge status: Home / Self Care | Payer: BC Managed Care – PPO | Source: Ambulatory Visit | Attending: Obstetrics and Gynecology | Admitting: Obstetrics and Gynecology

## 2011-02-24 DIAGNOSIS — Z01812 Encounter for preprocedural laboratory examination: Secondary | ICD-10-CM

## 2011-02-24 DIAGNOSIS — N8353 Torsion of ovary, ovarian pedicle and fallopian tube: Secondary | ICD-10-CM | POA: Insufficient documentation

## 2011-02-24 DIAGNOSIS — Z01818 Encounter for other preprocedural examination: Secondary | ICD-10-CM

## 2011-02-24 LAB — ABO/RH: ABO/RH(D): O POS

## 2011-02-25 NOTE — Discharge Summary (Signed)
NAME:  Tammie [name], Tammie [name] NO.:  [id number]   MEDICAL RECORD NO.:  [id number]          PATIENT TYPE:  INP   LOCATION:  2004                         FACILITY:  [facility]   PHYSICIAN:  Jesse Sans. Wall, M.D.   DATE OF BIRTH:  03-Sep-1976   DATE OF ADMISSION:  07/06/2005  DATE OF DISCHARGE:  07/08/2005                                 DISCHARGE SUMMARY   PRIMARY CARDIOLOGIST:  Learta Codding, M.D.   PRIMARY CARE PHYSICIAN:  Dr. Charm Barges in [place].   DISCHARGE DIAGNOSES:  1.  Hypertension.  2.  Chest pain with left arm numbness with negative cardiac workup.  3.  Hypokalemia.   PAST MEDICAL HISTORY:  Hypertension.  Palpitations.  GERD.  Asthma.  Tubal  ligation.  Fatty liver and pancreas apparently by abdominal ultrasound in  July 2006.   HOSPITAL COURSE:  Tammie [name] is a 35 year old African-American female with  no known history of Coronary artery disease who is actually followed by Dr.  Andee Lineman at the [facility] office for hypertension and palpitations.  Tammie [name] had  recently been started on medication for blood pressure and palpitations.  Her initial evaluation was in 2004 for palpitations and review of the event  monitor at that time showing an occasional PAC; however, Tammie [name]  continued to have problems with labile hypertension and was admitted this  admission complaining of some chest pain with left arm numbness in the  setting of hypertension.  She was admitted to rule out MI and gain better  control of her blood pressure and evaluate  the patient for possible  renovascular hypertension and/or pheochromocytoma.  The patient's cardiac  workup of troponin negative, TSH within normal limits, hemoglobin A1c 4.7.  The patient denied any further chest discomfort.  She tolerated adjustments  in medications.  Prior to discharge cortisol level was 8.4 well within  normal limits.  She is being discharged home after seen by Dr. Juanito Doom and  receiving nutritional education  regarding a low sodium diet and literature  on hypertension.   DISCHARGE MEDICATIONS:  1.  Cardizem 180 mg p.o. b.i.d.  2.  HCTZ 25 mg daily.  3.  K-Dur 40 mEq daily.   PROCEDURES THIS ADMISSION:  None.  Chest x-ray on July 06, 2005 showing  no acute cardiopulmonary process.   LABORATORY DATA:  Hemoglobin A1c 4.7, TSH 0.559.  Lipid panel:  Total  cholesterol 156, triglycerides 83, HDL 36 with an LDL of 103.  Cortisol a.m.  level of 8.4.  Cardiac enzymes:  Troponin less than 0.02 x3.  Sodium 132,  potassium initially 3.2, glucose 110, BUN 10, creatinine 0.9, AST 21, ALT  17.  Hemoglobin 15, hematocrit 44.  Pending lab work prior to discharge  includes aldosterone level, result of 24-hour urine for VMA, catecholamine,  and metanephrine.   DISPOSITION:  The patient being discharged home on a low fat and low salt  diet.  Follow up with Dr. Andee Lineman at [phone] July 28, 2005 at 1:45.  She  has instructions to continue to increase her exercise and activity level.  I  have also recommended  the patient to check her blood pressure and record at  home and discuss results with Dr. Andee Lineman at followup appointment.   DURATION OF DISCHARGE ENCOUNTER:  25 minutes.      Dorian Pod, NP      Jesse Sans. Wall, M.D.  Electronically Signed    MB/MEDQ  D:  07/08/2005  T:  07/08/2005  Job:  [id number]   cc:   Dr. Charm Barges in [place], [place].   Learta Codding, M.D. [facility]  [address]  [address]  [place]  [place] [address]

## 2011-02-25 NOTE — H&P (Signed)
NAME:  Tammie, [name]             ACCOUNT NO.:  [id number]   MEDICAL RECORD NO.:  [id number]          PATIENT TYPE:  INP   LOCATION:  1823                         FACILITY:  [facility]   PHYSICIAN:  Jesse Sans. Wall, M.D.   DATE OF BIRTH:  [birth year]   DATE OF ADMISSION:  07/06/2005  DATE OF DISCHARGE:                                HISTORY & PHYSICAL   PRIMARY CARDIOLOGIST:  Learta Codding, M.D.   PRIMARY CARE PHYSICIAN:  Dr. Charm Barges, [place], [place].   CHIEF COMPLAINT:  Chest pain with left arm numbness.   [name] is a 35 year old African-American female who was actually seen by  Dr. Andee Lineman in [place] on September 22.  He initially saw [name] in 2004 for  palpitations and review of an event monitor at that time.  The patient had  occasional premature atrial contractions, and we felt her symptoms were  largely due to panic attacks.  The patient also has labile hypertension. She  was tried on a low-dose beta blocker in 2004 but was not able to tolerate  this.  Recently she has also required two separate admissions to [facility] for uncontrolled hypertension and palpitations with atypical left-  sided chest pain.  [name] saw Dr. Andee Lineman in September.  A 12-lead EKG  was done that showed normal sinus rhythm with no acute ischemic changes and  no evidence of preexcitation.  Dr. Andee Lineman felt that anxiety was contributing  to [name]' overall symptoms.  However, she did have intermittent  elevated blood pressure and palpitations and was found to be hypokalemic on  2 different occasions.  He felt patient may need further evaluation renal  vascular hypertension and/or pheochromocytoma. The patient was switched from  Norvasc, which she had been placed on by Dr. Olena Leatherwood at [facility],  to Cardizem for improved control of palpitations, and Dr. Andee Lineman was  planning on seeing patient back in the next couple of weeks for further  evaluation of blood pressure.   However, in the meantime [name] continued  to complain of palpitations and presented to [facility] 2 days ago  with blood pressure patient states was 180/115 at that time with chest pain.  [name] had ultrasound and 12-lead EKG and was discharged home.  She also  called Dr. Andee Lineman at the [facility] office approximately 2 days ago with same  complaints and was told to increased Cardizem to 240 mg daily and was  started back on hydrochlorothiazide for hypertension.  Ms.  Tammie [name] states  she has not felt well over the last 2 days and decided to come to [facility]  today for further evaluation.  She is complaining of what she calls PVCs.  She is rating her chest discomfort around a 7 at this time.  She states her  chest pain is worse when she has the early beats. Otherwise, it is a  continuous dull ache.  She also complains of some left arm numbness.  Ms.  Tammie [name] states compliance with her medication.   PAST MEDICAL HISTORY:  1.  Negative for cardiac catheterization.  2.  She has a history of asthma, well controlled.  She states she has to use      her albuterol inhaler approximately 2 times a month.  3.  History of GERD.  4.  Hypertension was just diagnosed 2 months ago.  5.  Tubal ligation.  6.  Abdominal ultrasound in July 2006 for abdominal pain just showing some      fatty liver and pancreas.  Negative for acute findings.   SOCIAL HISTORY:  She lives in [place] with her husband.  She is a  Veterinary surgeon for troubled teens. She has two children with no heath problems.  Denies any tobacco, ETOH, drug, or herbal medication use.  She tries to  follow a low-fat diet.  For exercise, she is to start walking again.  She  states she has gained a large amount of weight and has tried to increase her  activity level.   FAMILY HISTORY:  Mother alive with no known coronary artery disease.  Father  alive with hypertension, diabetes.  She has a brother who is alive and well  with no known  heart or high blood pressure problems.   REVIEW OF SYSTEMS:  Positive for headaches and blurred vision at times.  The  patient states these symptoms are associated with increased blood pressure.  CARDIOPULMONARY: Positive for chest pain, shortness of breath, dyspnea on  exertion, palpitations, and syncopal episode 2 months ago at home which was  not witnessed.   PHYSICAL EXAMINATION:  VITAL SIGNS: Temperature 98.5, pulse initially 110,  currently 80.  Respirations initially 24, currently 18.  Blood pressure  initially 159/100, currently 123/68.  She is saturating 99% on room air.  GENERAL:  She is in no acute distress.  Very pleasant, cooperative female.  HEENT:  Pupils equal, round, and reactive to light.  Sclerae clear.  She has  her own teeth.  NECK:  Supple without lymphadenopathy.  Negative bruit or JVD.  CARDIOVASCULAR:  Heart rate regular rhythm, S1, S2.  She has pulses 2+ and  equal  without bruits.  LUNGS:  Clear to auscultation.  ABDOMEN:  Soft, nontender.  Positive bowel sounds.  EXTREMITIES:  Without clubbing, cyanosis, or edema.  She has 2+ DPs  bilaterally.  NEUROLOGIC:  She is alert and oriented x3.  Cranial nerves II-XII grossly  intact.   Chest x-ray PA and lateral showing no acute cardiopulmonary disease.   EKG at a rate of 82, normal sinus rhythm with occasional PACs.   Lab work: Hemoglobin 15, hematocrit 44.  Sodium 37, potassium 3.4, BUN 5,  creatinine 0.9 with glucose 96.  Point-of-care enzymes show troponin less  than 0.05 x1.   Dr. Elijah Birk wall in to examine and assess the patient.   IMPRESSION:  1.  Labile hypertension.  2.  Left ventricular hypertrophy.  3.  Obesity with sedentary lifestyle.  4.  Hypokalemia.   PLAN:  1.  Admit patient.  2.  Check a TSH and magnesium level.  3.  Supplement potassium with p.o. dose.  4.  Check a cortisol and aldosterone level, 24-hour urine for VMA,     catecholamine, and metanephrine. Routine urinalysis, hemoglobin  A1c.  5.  The patient will have nutrition consultation for low-sodium diet.  6.  Blood pressure education.  7.  She needs to increase her exercise level 3 hours a week and decrease her      weight.  The patient seems motivated to make lifestyle changes.  8.  Also recheck her potassium level  in the morning.      Dorian Pod, NP      Jesse Sans. Wall, M.D.  Electronically Signed    MB/MEDQ  D:  07/06/2005  T:  07/06/2005  Job:  [id number]   cc:   Learta Codding, M.D. [facility]  [address]  [address]  [place]  [place] [address]   Dr. Charm Barges, [place], [place]

## 2011-03-10 NOTE — Op Note (Signed)
NAMECARMIE, LANPHER           ACCOUNT NO.:  [id number]  MEDICAL RECORD NO.:  [id number]           PATIENT TYPE:  O  LOCATION:  [facility]                          [facility]  PHYSICIAN:  Catalina Antigua, MD     DATE OF BIRTH:  [birth year]  DATE OF PROCEDURE:  02/24/2011 DATE OF DISCHARGE:  02/24/2011                              OPERATIVE REPORT   This is a 35 year old G2, P2, with pelvic pain and ultrasound findings suggestive of the exophytic fibroid presenting today for laparoscopic myomectomy.  POSTOPERATIVE DIAGNOSIS:  Approximately 10 cm left ovary with solid mass.  PROCEDURE:  A laparoscopic left salpingo-oophorectomy.  SURGEON:  Catalina Antigua, MD  ASSISTANT:  Dr. Shawnie Pons  ANESTHESIA:  General.  ESTIMATED BLOOD LOSS:  250 mL.  COMPLICATIONS:  None.  SPECIMEN:left tube and ovary and they were sent to Pathology   FINDINGS: grossly normal uterus, grossly normal right ovary and the right fallopian tube with evidence of tubal ligation and large approximately 10 cm left ovary with evidence of torsion and solid mass.   PROCEDURE:  After informed consent was obtained, the patient was taken to the operating room where anesthesia was induced and found to be adequate.  The patient was placed in dorsal lithotomy position and prepped and draped in usual sterile fashion.  A speculum was placed in the vagina.  Anterior lip of the cervix was grasped with single-tooth tenaculum.  Hulka uterine manipulator was introduced into the uterine cavity.  Attention was then turned to the patient's abdomen where a 1-cm infraumbilical skin incision was made with a scalpel and carried down to the underlying layer of fascia.  The fascia was grasped with Kocher clamps, tented up, and entered sharply.  The underlying perineum was identified, tented up, and entered bluntly.  The 10 mm Hasson was then introduced into the abdominal cavity.  Intra-abdominal placement was confirmed by the  introduction of laparoscope.  Pneumoperitoneum was achieved with insufflation of CO2 gas with the above-noted findings.  A 5-mm skin incision was made in the right lower quadrant and under direct visualization a 5-mm trocar and sleeve was introduced into the abdominal cavity.  Similarly a 5-mm skin incision was made in the left lower quadrant and under direct visualization and left trocar and sleeve was introduced into the abdominal cavity using.  Using the Enseal the infundibulopelvic ligament on the left side was identified, grasped, coagulated, and transected.  Similarly, the utero-ovarian ligament on the left side was identified, grasped with Enseal and after thorough  coagulation, was transected.  A 15-mm Endopouch bag was introduced into the abdominal cavity.  Specimen was placed in the pouch and this was removed initially in pieces by carefully coring out the specimen as to not disturb the integrity of the Endopouch bag. The specimen eventually was delivered through the incision.  The laparoscope was then reintroduced into the abdominal cavity.  Copious irrigation of the abdomen was then performed.  Hemostasis was visualized. To ensure persistence of hemostasis, Surgicel was applied over the remaining portion of the IP.  All instruments were removed from the patient's abdomen.  The patient tolerated the procedure well.  The fascia was reapproximated with 0 Vicryl.  The skin incisions were reapproximated with 3-0 Vicryl.  All sponge, lap, needle count correct x2.     Catalina Antigua, MD     PC/MEDQ  D:  02/24/2011  T:  02/25/2011  Job:  [id number]  Electronically Signed by Catalina Antigua  on 03/10/2011 06:46:03 PM

## 2011-03-18 ENCOUNTER — Ambulatory Visit: Payer: BC Managed Care – PPO | Admitting: Obstetrics and Gynecology

## 2011-03-18 DIAGNOSIS — Z09 Encounter for follow-up examination after completed treatment for conditions other than malignant neoplasm: Secondary | ICD-10-CM

## 2011-03-19 NOTE — Group Therapy Note (Signed)
NAMEMERIT, GADSBY NO.:  [id number]  MEDICAL RECORD NO.:  [id number]           PATIENT TYPE:  A  LOCATION:  [facility]                   FACILITY:  [facility]  PHYSICIAN:  Catalina Antigua, MD     DATE OF BIRTH:  08/18/1976  DATE OF SERVICE:  03/18/2011                                 CLINIC NOTE  This is a 35 year old, G2, P2 with history of pelvic pain who is status post laparoscopic left salpingo-oophorectomy on Feb 24, 2011, presenting today for postoperative check.  The patient states that since the surgery she has been doing very well with minimal pain.  Denies any fevers or drainage from her incision.  Results of the pathology as well as the surgery were reviewed with the patient.  PHYSICAL EXAMINATION:  VITAL SIGNS:  Her blood pressure is 141/87, pulse of 91, weight of 130 kg, height of 69-1/2 inches, and her temperature is 98.1. ABDOMEN:  Soft, nontender, and nondistended, but obese.  All 3 incision sites are healing beautifully without any evidence of erythema, induration, or discharge.  ASSESSMENT/PLAN:  This is a 35 year old, G2, P2, status post laparoscopic salpingo-oophorectomy on Feb 24, 2011, with the removal of an ovarian leiomyoma and a benign fallopian tube presenting today for postoperative check.  The patient is doing well.  Incisions are healing beautifully.  The patient is here to resume all activities of daily living.  The patient is to return in March for annual exam.          ______________________________ Catalina Antigua, MD    PC/MEDQ  D:  03/18/2011  T:  03/19/2011  Job:  [id number]

## 2011-03-21 ENCOUNTER — Other Ambulatory Visit: Payer: Self-pay | Admitting: Cardiology

## 2011-03-23 ENCOUNTER — Inpatient Hospital Stay (HOSPITAL_COMMUNITY)
Admission: AD | Admit: 2011-03-23 | Discharge: 2011-03-23 | Disposition: A | Discharge status: Home / Self Care | Payer: BC Managed Care – PPO | Source: Ambulatory Visit | Attending: Obstetrics & Gynecology | Admitting: Obstetrics & Gynecology

## 2011-03-23 DIAGNOSIS — N949 Unspecified condition associated with female genital organs and menstrual cycle: Secondary | ICD-10-CM

## 2011-03-23 DIAGNOSIS — N938 Other specified abnormal uterine and vaginal bleeding: Secondary | ICD-10-CM | POA: Insufficient documentation

## 2011-03-23 LAB — URINALYSIS, ROUTINE W REFLEX MICROSCOPIC
Glucose, UA: NEGATIVE mg/dL
Leukocytes, UA: NEGATIVE
Protein, ur: NEGATIVE mg/dL
Specific Gravity, Urine: 1.025 (ref 1.005–1.030)

## 2011-03-23 LAB — CBC
HCT: 32.2 % — ABNORMAL LOW (ref 36.0–46.0)
Hemoglobin: 10.4 g/dL — ABNORMAL LOW (ref 12.0–15.0)
RBC: 3.53 MIL/uL — ABNORMAL LOW (ref 3.87–5.11)
RDW: 14.1 % (ref 11.5–15.5)
WBC: 10.8 10*3/uL — ABNORMAL HIGH (ref 4.0–10.5)

## 2011-03-23 LAB — URINE MICROSCOPIC-ADD ON

## 2011-04-16 ENCOUNTER — Emergency Department (HOSPITAL_COMMUNITY)
Admission: EM | Admit: 2011-04-16 | Discharge: 2011-04-17 | Disposition: A | Discharge status: Home / Self Care | Payer: BC Managed Care – PPO | Attending: Emergency Medicine | Admitting: Emergency Medicine

## 2011-04-16 DIAGNOSIS — E876 Hypokalemia: Secondary | ICD-10-CM | POA: Insufficient documentation

## 2011-04-16 DIAGNOSIS — R5381 Other malaise: Secondary | ICD-10-CM | POA: Insufficient documentation

## 2011-04-16 DIAGNOSIS — Z9889 Other specified postprocedural states: Secondary | ICD-10-CM | POA: Insufficient documentation

## 2011-04-16 DIAGNOSIS — N938 Other specified abnormal uterine and vaginal bleeding: Secondary | ICD-10-CM | POA: Insufficient documentation

## 2011-04-16 DIAGNOSIS — D6489 Other specified anemias: Secondary | ICD-10-CM | POA: Insufficient documentation

## 2011-04-16 DIAGNOSIS — N949 Unspecified condition associated with female genital organs and menstrual cycle: Secondary | ICD-10-CM | POA: Insufficient documentation

## 2011-04-16 DIAGNOSIS — I10 Essential (primary) hypertension: Secondary | ICD-10-CM | POA: Insufficient documentation

## 2011-04-16 DIAGNOSIS — R61 Generalized hyperhidrosis: Secondary | ICD-10-CM | POA: Insufficient documentation

## 2011-04-16 DIAGNOSIS — R42 Dizziness and giddiness: Secondary | ICD-10-CM | POA: Insufficient documentation

## 2011-04-16 DIAGNOSIS — Z79899 Other long term (current) drug therapy: Secondary | ICD-10-CM | POA: Insufficient documentation

## 2011-04-16 LAB — CBC
MCH: 29.1 pg (ref 26.0–34.0)
MCV: 88.3 fL (ref 78.0–100.0)
Platelets: 292 10*3/uL (ref 150–400)
RDW: 13.6 % (ref 11.5–15.5)
WBC: 11.7 10*3/uL — ABNORMAL HIGH (ref 4.0–10.5)

## 2011-04-16 LAB — DIFFERENTIAL
Eosinophils Absolute: 0.1 10*3/uL (ref 0.0–0.7)
Eosinophils Relative: 1 % (ref 0–5)
Lymphs Abs: 2.9 10*3/uL (ref 0.7–4.0)
Monocytes Relative: 5 % (ref 3–12)

## 2011-04-17 LAB — URINALYSIS, ROUTINE W REFLEX MICROSCOPIC
Bilirubin Urine: NEGATIVE
Specific Gravity, Urine: 1.028 (ref 1.005–1.030)
pH: 5.5 (ref 5.0–8.0)

## 2011-04-17 LAB — BASIC METABOLIC PANEL
CO2: 26 mEq/L (ref 19–32)
Calcium: 8.7 mg/dL (ref 8.4–10.5)
Creatinine, Ser: 0.88 mg/dL (ref 0.50–1.10)
Glucose, Bld: 117 mg/dL — ABNORMAL HIGH (ref 70–99)

## 2011-04-17 LAB — URINE MICROSCOPIC-ADD ON

## 2011-04-29 ENCOUNTER — Other Ambulatory Visit: Payer: Self-pay | Admitting: Cardiology

## 2011-05-06 ENCOUNTER — Inpatient Hospital Stay (HOSPITAL_COMMUNITY)
Admission: AD | Admit: 2011-05-06 | Discharge: 2011-05-06 | Disposition: A | Discharge status: Home / Self Care | Payer: BC Managed Care – PPO | Source: Ambulatory Visit | Attending: Obstetrics & Gynecology | Admitting: Obstetrics & Gynecology

## 2011-05-06 ENCOUNTER — Encounter (HOSPITAL_COMMUNITY): Payer: Self-pay

## 2011-05-06 DIAGNOSIS — N949 Unspecified condition associated with female genital organs and menstrual cycle: Secondary | ICD-10-CM | POA: Insufficient documentation

## 2011-05-06 DIAGNOSIS — N898 Other specified noninflammatory disorders of vagina: Secondary | ICD-10-CM | POA: Insufficient documentation

## 2011-05-06 DIAGNOSIS — R109 Unspecified abdominal pain: Secondary | ICD-10-CM | POA: Insufficient documentation

## 2011-05-06 DIAGNOSIS — N946 Dysmenorrhea, unspecified: Secondary | ICD-10-CM | POA: Insufficient documentation

## 2011-05-06 HISTORY — DX: Benign neoplasm of connective and other soft tissue, unspecified: D21.9

## 2011-05-06 HISTORY — DX: Unspecified abnormal cytological findings in specimens from cervix uteri: R87.619

## 2011-05-06 HISTORY — DX: Essential (primary) hypertension: I10

## 2011-05-06 HISTORY — DX: Reserved for concepts with insufficient information to code with codable children: IMO0002

## 2011-05-06 HISTORY — DX: Anemia, unspecified: D64.9

## 2011-05-06 LAB — URINALYSIS, ROUTINE W REFLEX MICROSCOPIC
Bilirubin Urine: NEGATIVE
Nitrite: NEGATIVE
Specific Gravity, Urine: 1.02 (ref 1.005–1.030)
Urobilinogen, UA: 1 mg/dL (ref 0.0–1.0)

## 2011-05-06 LAB — URINE MICROSCOPIC-ADD ON

## 2011-05-06 LAB — WET PREP, GENITAL
Trich, Wet Prep: NONE SEEN
WBC, Wet Prep HPF POC: NONE SEEN
Yeast Wet Prep HPF POC: NONE SEEN

## 2011-05-06 MED ORDER — KETOROLAC TROMETHAMINE 60 MG/2ML IM SOLN
60.0000 mg | Freq: Once | INTRAMUSCULAR | Status: AC
  Administered 2011-05-06: 60 mg via INTRAMUSCULAR
  Filled 2011-05-06: qty 2

## 2011-05-06 NOTE — Progress Notes (Signed)
Patient states that she had her left ovary and fallopian tube removed in may 2012 by dr constant. Patient states that she used nare shaving cream on Monday and shortly thereafter she started having burning and pain in her vaginal area that radiated to the left lower quadrant abdomen. She used otc monistat 3 but the symptoms is worse. Frequent urination without pain or pressure and dull constant abdominal pain.

## 2011-05-06 NOTE — Progress Notes (Signed)
Pt states, " I had my left tube and ovary along with fibroids removed May 17,2012[facility]) . I used "Darene Lamer" one week ago then I got vaginal itching and I used OTC Monistat 3 day. The second days I used it my lower left abdomen felt sore, and it has continued to get worse."

## 2011-05-06 NOTE — ED Provider Notes (Signed)
History     Chief Complaint  Patient presents with  . Abdominal Pain  . Vaginal Pain   HPI 35 yo G2P2002 2+ months s/p left tube and ovary removal for ovarian cyst. C/O pelvic pain and vaginal irritation. Used Nair in pubic area earlier this week with subsequent itching and irritation. Felt like she had a yeast infection, so used 3 day course of Monistat, no relief, at that time began to have pelvic pain, left side > right side. No vaginal bleeding, pt is expecting her period to start soon.   OB History    Grav Para Term Preterm Abortions TAB SAB Ect Mult Living   2 2 2       2       Past Medical History  Diagnosis Date  . Hypertension   . Asthma   . Fibroid   . Anemia   . Abnormal Pap smear     Past Surgical History  Procedure Date  . Tubal ligation   . Partial hysterectomy 02/27/2011    left side  . Colposcopy     Family History  Problem Relation Age of Onset  . Hypertension Father   . Kidney disease Father   . Diabetes Maternal Grandmother   . Hypertension Paternal Grandmother     History  Substance Use Topics  . Smoking status: Never Smoker   . Smokeless tobacco: Not on file  . Alcohol Use: No    Allergies:  Allergies  Allergen Reactions  . Sulfonamide Derivatives     Prescriptions prior to admission  Medication Sig Dispense Refill  . spironolactone (ALDACTONE) 50 MG tablet TAKE 1 TABLET BY MOUTH ONCE A DAY  30 tablet  5  . verapamil (CALAN-SR) 180 MG CR tablet TAKE 2 TABLETS BY MOUTH ONCE A DAY  60 tablet  2    Review of Systems  Constitutional: Negative.   Respiratory: Negative.   Cardiovascular: Negative.   Gastrointestinal: Positive for abdominal pain. Negative for nausea, vomiting, diarrhea and constipation.  Genitourinary: Negative for dysuria, urgency, frequency, hematuria and flank pain.       Negative for vaginal bleeding  Musculoskeletal: Negative.   Neurological: Negative.   Psychiatric/Behavioral: Negative.    Physical Exam    Blood pressure 125/70, pulse 84, temperature 99.4 F (37.4 C), temperature source Oral, height 5\' 9"  (1.753 m), weight 131.203 kg (289 lb 4 oz), last menstrual period 04/06/2011.  Physical Exam  Constitutional: She is oriented to person, place, and time. She appears well-developed and well-nourished. No distress.  HENT:  Head: Normocephalic and atraumatic.  Cardiovascular: Normal rate, regular rhythm and normal heart sounds.   Respiratory: Effort normal and breath sounds normal. No respiratory distress.  GI: Soft. Bowel sounds are normal. She exhibits no distension and no mass. There is no tenderness. There is no rebound and no guarding.  Genitourinary: There is no rash or lesion on the right labia. There is no rash or lesion on the left labia. Uterus is not deviated, not enlarged, not fixed and not tender. Cervix exhibits no motion tenderness, no discharge and no friability. Right adnexum displays no mass, no tenderness and no fullness. Left adnexum displays no mass, no tenderness and no fullness. No erythema, tenderness or bleeding around the vagina. Vaginal discharge (creamy, white blood tinged) found.  Neurological: She is alert and oriented to person, place, and time.  Skin: Skin is warm and dry.  Psychiatric: She has a normal mood and affect.    MAU Course  Procedures  Assessment and Plan  Wet prep, GC/CT, UPT and UA pending Toradol ordered for pain Care assumed by M. Maris Berger  [facility] 05/06/2011, 7:56 PM

## 2011-05-11 ENCOUNTER — Telehealth: Payer: Self-pay | Admitting: Obstetrics and Gynecology

## 2011-05-11 ENCOUNTER — Inpatient Hospital Stay (HOSPITAL_COMMUNITY)
Admission: AD | Admit: 2011-05-11 | Discharge: 2011-05-11 | Disposition: A | Discharge status: Home / Self Care | Payer: BC Managed Care – PPO | Source: Ambulatory Visit | Attending: Obstetrics and Gynecology | Admitting: Obstetrics and Gynecology

## 2011-05-11 ENCOUNTER — Encounter (HOSPITAL_COMMUNITY): Payer: Self-pay | Admitting: Obstetrics and Gynecology

## 2011-05-11 DIAGNOSIS — R21 Rash and other nonspecific skin eruption: Secondary | ICD-10-CM | POA: Insufficient documentation

## 2011-05-11 DIAGNOSIS — R5381 Other malaise: Secondary | ICD-10-CM | POA: Insufficient documentation

## 2011-05-11 DIAGNOSIS — R509 Fever, unspecified: Secondary | ICD-10-CM | POA: Insufficient documentation

## 2011-05-11 DIAGNOSIS — R109 Unspecified abdominal pain: Secondary | ICD-10-CM | POA: Insufficient documentation

## 2011-05-11 LAB — CBC
HCT: 34.3 % — ABNORMAL LOW (ref 36.0–46.0)
Hemoglobin: 11.2 g/dL — ABNORMAL LOW (ref 12.0–15.0)
MCV: 88.4 fL (ref 78.0–100.0)
RBC: 3.88 MIL/uL (ref 3.87–5.11)
RDW: 13.8 % (ref 11.5–15.5)
WBC: 6 10*3/uL (ref 4.0–10.5)

## 2011-05-11 MED ORDER — NYSTATIN-TRIAMCINOLONE 100000-0.1 UNIT/GM-% EX CREA: TOPICAL_CREAM | CUTANEOUS | Status: DC

## 2011-05-11 NOTE — ED Provider Notes (Signed)
History   Pt presents today c/o some red bumps near her incision. She had a laparoscopic myomectomy in 5/12 by Dr. Jolayne Panther. She states she also had a fever yesterday as high as 101. She has also had some mild epigastric pain that comes and goes. She denies any other sx at this time.  Chief Complaint  Patient presents with  . Post-op Problem   HPI  OB History    Grav Para Term Preterm Abortions TAB SAB Ect Mult Living   2 2 2       2       Past Medical History  Diagnosis Date  . Hypertension   . Asthma   . Fibroid   . Anemia   . Abnormal Pap smear     Past Surgical History  Procedure Date  . Tubal ligation   . Partial hysterectomy 02/27/2011    left side  . Colposcopy   . Oophorectomy     Left    Family History  Problem Relation Age of Onset  . Hypertension Father   . Kidney disease Father   . Diabetes Maternal Grandmother   . Hypertension Paternal Grandmother     History  Substance Use Topics  . Smoking status: Never Smoker   . Smokeless tobacco: Not on file  . Alcohol Use: No    Allergies:  Allergies  Allergen Reactions  . Sulfa Antibiotics Other (See Comments)    "Burning"  . Sulfonamide Derivatives     Prescriptions prior to admission  Medication Sig Dispense Refill  . Ferrous Sulfate (IRON) 325 (65 FE) MG TABS Take 1 tablet by mouth daily.        Marland Kitchen FIBER PO Take 2 tablets by mouth daily.        Marland Kitchen ibuprofen (ADVIL,MOTRIN) 600 MG tablet Take 600 mg by mouth every 6 (six) hours as needed. As needed for pain       . Potassium Chloride Crys CR (KLOR-CON M20 PO) Take 1 tablet by mouth 2 (two) times daily.        Marland Kitchen spironolactone (ALDACTONE) 50 MG tablet        . verapamil (CALAN-SR) 180 MG CR tablet          Review of Systems  Constitutional: Positive for fever and malaise/fatigue. Negative for chills.  Cardiovascular: Negative for chest pain and palpitations.  Gastrointestinal: Positive for abdominal pain. Negative for nausea, vomiting, diarrhea  and constipation.  Genitourinary: Negative for dysuria, urgency, frequency, hematuria and flank pain.  Skin: Positive for rash. Negative for itching.  Neurological: Negative for dizziness and headaches.  Psychiatric/Behavioral: Negative for depression and suicidal ideas.   Physical Exam   Blood pressure 138/74, pulse 90, temperature 99.9 F (37.7 C), temperature source Oral, resp. rate 18, height 5\' 9"  (1.753 m), weight 284 lb (128.822 kg), last menstrual period 05/07/2011.  Physical Exam  Constitutional: She is oriented to person, place, and time. She appears well-developed and well-nourished. No distress.  HENT:  Head: Normocephalic and atraumatic.  Eyes: EOM are normal. Pupils are equal, round, and reactive to light.  GI: Soft. She exhibits no distension and no mass. There is no tenderness. There is no rebound and no guarding.       Small then line of erythema to mid abd that ends over the prior port site. Then incision has healed nicely and there is no sign of infection. The "rash" appears more urticarial. Dr. Emelda Fear also examined pt and agreed.  Neurological: She is alert and  oriented to person, place, and time.  Skin: Skin is warm and dry. She is not diaphoretic.  Psychiatric: She has a normal mood and affect. Her behavior is normal. Judgment and thought content normal.    MAU Course  Procedures  Results for orders placed during the hospital encounter of 05/11/11 (from the past 24 hour(s))  CBC     Status: Abnormal   Collection Time   05/11/11 10:25 PM      Component Value Range   WBC 6.0  4.0 - 10.5 (K/uL)   RBC 3.88  3.87 - 5.11 (MIL/uL)   Hemoglobin 11.2 (*) 12.0 - 15.0 (g/dL)   HCT 16.1 (*) 09.6 - 46.0 (%)   MCV 88.4  78.0 - 100.0 (fL)   MCH 28.9  26.0 - 34.0 (pg)   MCHC 32.7  30.0 - 36.0 (g/dL)   RDW 04.5  40.9 - 81.1 (%)   Platelets 239  150 - 400 (K/uL)     Assessment and Plan  Rash: discussed with pt at length. Will give Rx for mycollog II cream. She will f/u  with her PCP. Discussed diet, activity, risks, and precautions.  Clinton Gallant. Rice III, DrHSc, MPAS, PA-C  05/11/2011, 10:27 PM  Henrietta Hoover, PA 05/11/11 [id number]

## 2011-05-11 NOTE — Progress Notes (Cosign Needed)
Pt had fibriod, left tube and ovary removed 02/24/2011.  Pt had fever last night of 101 and has red bumps above incision.

## 2011-05-11 NOTE — Progress Notes (Signed)
Dr. Jolayne Panther performed surgery 02/24/2011- removal of fibroid, removal of left ovary and left tubal ligation. No problems following the procedure. Pt presents to MAU with complaints of left, raised, red incisional swelling.

## 2011-05-11 NOTE — Telephone Encounter (Signed)
Returned call to patient. Patient stated that left side of incision has raised red bumps. States she felt chilly and sweaty last night. She is feeling that her incision is infected. Advised patient to go to MAU to be evaluated. Pt. Agrees.

## 2011-05-13 ENCOUNTER — Emergency Department (HOSPITAL_COMMUNITY)
Admission: EM | Admit: 2011-05-13 | Discharge: 2011-05-14 | Disposition: A | Discharge status: Home / Self Care | Payer: BC Managed Care – PPO | Attending: Emergency Medicine | Admitting: Emergency Medicine

## 2011-05-13 DIAGNOSIS — Z79899 Other long term (current) drug therapy: Secondary | ICD-10-CM | POA: Insufficient documentation

## 2011-05-13 DIAGNOSIS — R112 Nausea with vomiting, unspecified: Secondary | ICD-10-CM | POA: Insufficient documentation

## 2011-05-13 DIAGNOSIS — K625 Hemorrhage of anus and rectum: Secondary | ICD-10-CM | POA: Insufficient documentation

## 2011-05-13 DIAGNOSIS — J029 Acute pharyngitis, unspecified: Secondary | ICD-10-CM | POA: Insufficient documentation

## 2011-05-13 DIAGNOSIS — I10 Essential (primary) hypertension: Secondary | ICD-10-CM | POA: Insufficient documentation

## 2011-05-13 DIAGNOSIS — K921 Melena: Secondary | ICD-10-CM | POA: Insufficient documentation

## 2011-05-13 DIAGNOSIS — R1013 Epigastric pain: Secondary | ICD-10-CM | POA: Insufficient documentation

## 2011-05-13 DIAGNOSIS — R509 Fever, unspecified: Secondary | ICD-10-CM | POA: Insufficient documentation

## 2011-05-13 LAB — DIFFERENTIAL
Eosinophils Absolute: 0 10*3/uL (ref 0.0–0.7)
Lymphocytes Relative: 18 % (ref 12–46)
Lymphs Abs: 1.7 10*3/uL (ref 0.7–4.0)
Monocytes Relative: 6 % (ref 3–12)
Neutro Abs: 7.2 10*3/uL (ref 1.7–7.7)
Neutrophils Relative %: 76 % (ref 43–77)

## 2011-05-13 LAB — HEPATIC FUNCTION PANEL
ALT: 16 U/L (ref 0–35)
AST: 22 U/L (ref 0–37)
Albumin: 3.4 g/dL — ABNORMAL LOW (ref 3.5–5.2)
Alkaline Phosphatase: 52 U/L (ref 39–117)
Bilirubin, Direct: <0.1 mg/dL (ref 0.0–0.3)
Total Bilirubin: 0.2 mg/dL — ABNORMAL LOW (ref 0.3–1.2)
Total Protein: 8.2 g/dL (ref 6.0–8.3)

## 2011-05-13 LAB — CBC
Hemoglobin: 11.7 g/dL — ABNORMAL LOW (ref 12.0–15.0)
MCH: 28.3 pg (ref 26.0–34.0)
MCV: 86.4 fL (ref 78.0–100.0)
Platelets: 281 10*3/uL (ref 150–400)
RBC: 4.13 MIL/uL (ref 3.87–5.11)
WBC: 9.4 10*3/uL (ref 4.0–10.5)

## 2011-05-13 LAB — BASIC METABOLIC PANEL
BUN: 8 mg/dL (ref 6–23)
CO2: 28 mEq/L (ref 19–32)
Calcium: 9.2 mg/dL (ref 8.4–10.5)
Chloride: 97 mEq/L (ref 96–112)
Creatinine, Ser: 0.8 mg/dL (ref 0.50–1.10)
GFR calc Af Amer: >60 mL/min (ref >60)
GFR calc non Af Amer: >60 mL/min (ref >60)
Glucose, Bld: 86 mg/dL (ref 70–99)
Potassium: 3.8 mEq/L (ref 3.5–5.1)
Sodium: 132 mEq/L — ABNORMAL LOW (ref 135–145)

## 2011-05-13 LAB — OCCULT BLOOD, POC DEVICE: Fecal Occult Bld: NEGATIVE

## 2011-05-14 ENCOUNTER — Emergency Department (HOSPITAL_COMMUNITY): Payer: BC Managed Care – PPO

## 2011-05-14 ENCOUNTER — Inpatient Hospital Stay (INDEPENDENT_AMBULATORY_CARE_PROVIDER_SITE_OTHER)
Admission: RE | Admit: 2011-05-14 | Discharge: 2011-05-14 | Disposition: A | Discharge status: Home / Self Care | Payer: BC Managed Care – PPO | Source: Ambulatory Visit | Attending: Family Medicine | Admitting: Family Medicine

## 2011-05-14 DIAGNOSIS — W57XXXA Bitten or stung by nonvenomous insect and other nonvenomous arthropods, initial encounter: Secondary | ICD-10-CM

## 2011-05-14 LAB — URINALYSIS, ROUTINE W REFLEX MICROSCOPIC
Bilirubin Urine: NEGATIVE
Glucose, UA: NEGATIVE mg/dL
Hgb urine dipstick: NEGATIVE
Specific Gravity, Urine: 1.023 (ref 1.005–1.030)

## 2011-05-30 ENCOUNTER — Other Ambulatory Visit: Payer: Self-pay | Admitting: Cardiology

## 2011-06-16 ENCOUNTER — Inpatient Hospital Stay (INDEPENDENT_AMBULATORY_CARE_PROVIDER_SITE_OTHER)
Admission: RE | Admit: 2011-06-16 | Discharge: 2011-06-16 | Disposition: A | Discharge status: Home / Self Care | Payer: BC Managed Care – PPO | Source: Ambulatory Visit | Attending: Family Medicine | Admitting: Family Medicine

## 2011-06-16 DIAGNOSIS — I10 Essential (primary) hypertension: Secondary | ICD-10-CM

## 2011-06-19 ENCOUNTER — Emergency Department (HOSPITAL_COMMUNITY)
Admission: EM | Admit: 2011-06-19 | Discharge: 2011-06-20 | Disposition: A | Discharge status: Home / Self Care | Payer: BC Managed Care – PPO | Attending: Emergency Medicine | Admitting: Emergency Medicine

## 2011-06-19 ENCOUNTER — Emergency Department (HOSPITAL_COMMUNITY): Payer: BC Managed Care – PPO

## 2011-06-19 DIAGNOSIS — R142 Eructation: Secondary | ICD-10-CM | POA: Insufficient documentation

## 2011-06-19 DIAGNOSIS — K5900 Constipation, unspecified: Secondary | ICD-10-CM | POA: Insufficient documentation

## 2011-06-19 DIAGNOSIS — I10 Essential (primary) hypertension: Secondary | ICD-10-CM | POA: Insufficient documentation

## 2011-06-19 DIAGNOSIS — Z79899 Other long term (current) drug therapy: Secondary | ICD-10-CM | POA: Insufficient documentation

## 2011-06-19 DIAGNOSIS — R109 Unspecified abdominal pain: Secondary | ICD-10-CM | POA: Insufficient documentation

## 2011-06-19 DIAGNOSIS — R143 Flatulence: Secondary | ICD-10-CM | POA: Insufficient documentation

## 2011-06-19 DIAGNOSIS — R141 Gas pain: Secondary | ICD-10-CM | POA: Insufficient documentation

## 2011-07-07 LAB — POCT URINALYSIS DIP (DEVICE)
Bilirubin Urine: NEGATIVE
Glucose, UA: NEGATIVE
Ketones, ur: NEGATIVE
Operator id: 239701
Protein, ur: 30 — AB

## 2011-07-07 LAB — POCT I-STAT, CHEM 8
Chloride: 101
HCT: 42
Potassium: 3.8
Sodium: 138

## 2011-07-07 LAB — POCT PREGNANCY, URINE
Operator id: 235561
Preg Test, Ur: NEGATIVE

## 2011-07-11 ENCOUNTER — Emergency Department (HOSPITAL_COMMUNITY)
Admission: EM | Admit: 2011-07-11 | Discharge: 2011-07-11 | Disposition: A | Discharge status: Home / Self Care | Payer: BC Managed Care – PPO | Attending: Emergency Medicine | Admitting: Emergency Medicine

## 2011-07-11 ENCOUNTER — Emergency Department (HOSPITAL_COMMUNITY): Payer: BC Managed Care – PPO

## 2011-07-11 DIAGNOSIS — S92909A Unspecified fracture of unspecified foot, initial encounter for closed fracture: Secondary | ICD-10-CM | POA: Insufficient documentation

## 2011-07-11 DIAGNOSIS — M25579 Pain in unspecified ankle and joints of unspecified foot: Secondary | ICD-10-CM | POA: Insufficient documentation

## 2011-07-11 DIAGNOSIS — M25473 Effusion, unspecified ankle: Secondary | ICD-10-CM | POA: Insufficient documentation

## 2011-07-11 DIAGNOSIS — M25476 Effusion, unspecified foot: Secondary | ICD-10-CM | POA: Insufficient documentation

## 2011-07-11 LAB — POCT URINALYSIS DIP (DEVICE)
Bilirubin Urine: NEGATIVE
Glucose, UA: NEGATIVE
Ketones, ur: NEGATIVE
Nitrite: NEGATIVE
Operator id: 239701
pH: 7

## 2011-07-11 LAB — WET PREP, GENITAL: Yeast Wet Prep HPF POC: NONE SEEN

## 2011-07-11 LAB — RPR: RPR Ser Ql: NONREACTIVE

## 2011-07-11 LAB — GC/CHLAMYDIA PROBE AMP, GENITAL: GC Probe Amp, Genital: NEGATIVE

## 2011-07-11 LAB — POCT PREGNANCY, URINE: Preg Test, Ur: NEGATIVE

## 2011-07-26 LAB — POCT URINALYSIS DIP (DEVICE)
Bilirubin Urine: NEGATIVE
Glucose, UA: NEGATIVE
Hgb urine dipstick: NEGATIVE
Nitrite: NEGATIVE
Operator id: 270961
Specific Gravity, Urine: 1.015
Urobilinogen, UA: 1

## 2011-07-26 LAB — GC/CHLAMYDIA PROBE AMP, GENITAL
Chlamydia, DNA Probe: NEGATIVE
GC Probe Amp, Genital: NEGATIVE

## 2011-07-26 LAB — URINE CULTURE: Colony Count: 35000

## 2011-07-26 LAB — POCT PREGNANCY, URINE
Operator id: 270961
Preg Test, Ur: NEGATIVE

## 2011-07-26 LAB — WET PREP, GENITAL: Yeast Wet Prep HPF POC: NONE SEEN

## 2011-08-21 ENCOUNTER — Other Ambulatory Visit: Payer: Self-pay | Admitting: Cardiology

## 2011-08-22 NOTE — Telephone Encounter (Signed)
Pt has not been seen since 12-10-08. Has been to the ER but not for cardiac reasons. Plus rx says source has been discontinued.

## 2011-10-12 ENCOUNTER — Telehealth: Payer: Self-pay | Admitting: Cardiology

## 2011-10-12 ENCOUNTER — Other Ambulatory Visit: Payer: Self-pay | Admitting: Cardiology

## 2011-10-12 MED ORDER — SPIRONOLACTONE 50 MG PO TABS: 50.0000 mg | ORAL_TABLET | Freq: Every day | ORAL | Status: DC

## 2011-10-12 MED ORDER — VERAPAMIL HCL ER 180 MG PO TBCR: 360.0000 mg | EXTENDED_RELEASE_TABLET | Freq: Every day | ORAL | Status: DC

## 2011-10-12 NOTE — Telephone Encounter (Signed)
I will call in a 1 month supply of requested meds.  Please schedule pt to see Dr. Daleen Squibb within 1 month.

## 2011-10-12 NOTE — Telephone Encounter (Signed)
New Problem:     Patient has not seen Dr. Daleen Squibb in about 2 yrs and would like to schedule an appointment and would like to know if Dr. Daleen Squibb would give her enough spironolactone (ALDACTONE) 50 MG tablet and verapamil (CALAN-SR) 180 MG CR tablet to last her until her appointment.  She has been out of her medication for the past two weeks.  Would also like to know since she does not have insurance if she soul get help signing up for a financial help program. Please advise.

## 2011-10-14 NOTE — Telephone Encounter (Signed)
Patient was self pay and probably wanted to know if Dr. Daleen Squibb would be able to give her any samples of her medication from the office.

## 2011-10-20 ENCOUNTER — Telehealth: Payer: Self-pay | Admitting: Cardiology

## 2011-10-20 NOTE — Telephone Encounter (Signed)
Informed pt that Dr. Daleen Squibb will give letter after seeing pt at scheduled appt on 11/16/2011.

## 2011-10-20 NOTE — Telephone Encounter (Signed)
New msg Pt needs a letter stating she can take a fit and well class. Please call

## 2011-11-14 ENCOUNTER — Encounter: Payer: Self-pay | Admitting: Cardiology

## 2011-11-16 ENCOUNTER — Encounter: Payer: Self-pay | Admitting: Cardiology

## 2011-11-16 ENCOUNTER — Ambulatory Visit: Payer: BC Managed Care – PPO | Admitting: Cardiology

## 2011-11-16 ENCOUNTER — Ambulatory Visit (INDEPENDENT_AMBULATORY_CARE_PROVIDER_SITE_OTHER): Payer: Self-pay | Admitting: Cardiology

## 2011-11-16 VITALS — BP 148/87 | HR 79 | Resp 18 | Ht 69.0 in | Wt 276.0 lb

## 2011-11-16 DIAGNOSIS — I10 Essential (primary) hypertension: Secondary | ICD-10-CM

## 2011-11-16 NOTE — Assessment & Plan Note (Signed)
Her weight continues to be a major issue. I've advised her that her risk of developing diabetes is high. I will check a hemoglobin A1c today. She really needs a primary care physician as well which I encouraged her to do.

## 2011-11-16 NOTE — Progress Notes (Signed)
HPI Tammie [name] comes in today for evaluation and management of her hypertension and morbid obesity. 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 She is self-pay. She does not have a primary care physician.  She is compliant with her medications. She has not lost weight. She has no limitations with exercise would like to join a fitness class. She needs a note to clear her today.  He is worried about developing kidney disease like her dad. She would like to have a kidney screen today.  Past Medical History  Diagnosis Date  . Hypertension   . Asthma   . Fibroid   . Anemia   . Abnormal Pap smear     Current Outpatient Prescriptions  Medication Sig Dispense Refill  . Ferrous Sulfate (IRON) 325 (65 FE) MG TABS Take 1 tablet by mouth daily.        . potassium chloride SA (KLOR-CON M20) 20 MEQ tablet Take 1 tablet (20 mEq total) by mouth 2 (two) times daily.  60 each  3  . spironolactone (ALDACTONE) 50 MG tablet Take 1 tablet (50 mg total) by mouth daily.  30 tablet  0  . verapamil (CALAN-SR) 180 MG CR tablet Take 2 tablets (360 mg total) by mouth daily.  60 tablet  0    Allergies  Allergen Reactions  . Sulfa Antibiotics Other (See Comments)    "Burning"  . Sulfonamide Derivatives     Family History  Problem Relation Age of Onset  . Hypertension Father   . Kidney disease Father   . Diabetes Maternal Grandmother   . Hypertension Paternal Grandmother     History   Social History  . Marital Status: Single    Spouse Name: N/A    Number of Children: N/A  . Years of Education: N/A   Occupational History  . Not on file.   Social History Main Topics  . Smoking status: Never Smoker   . Smokeless tobacco: Not on file  . Alcohol Use: No  . Drug Use: No  . Sexually Active: Yes    Birth Control/ Protection: Other-see comments   Other Topics Concern  . Not on file   Social History Narrative  . No narrative on file    ROS ALL NEGATIVE  EXCEPT THOSE NOTED IN HPI  PE  General Appearance: well developed, well nourished in no acute distress, morbidly obese HEENT: symmetrical face, PERRLA, good dentition  Neck: no JVD, thyromegaly, or adenopathy, trachea midline Chest: symmetric without deformity Cardiac: PMI non-displaced, RRR, normal S1, S2, no gallop or murmur Lung: clear to ausculation and percussion Vascular: all pulses full without bruits  Abdominal: nondistended, nontender, good bowel sounds, no HSM, no bruits Extremities: no cyanosis, clubbing or edema, no sign of DVT, no varicosities  Skin: normal color, no rashes Neuro: alert and oriented x 3, non-focal Pysch: normal affect  EKG  BMET    Component Value Date/Time   NA 132* 05/13/2011 2146   K 3.8 05/13/2011 2146   CL 97 05/13/2011 2146   CO2 28 05/13/2011 2146   GLUCOSE 86 05/13/2011 2146   BUN 8 05/13/2011 2146   CREATININE 0.80 05/13/2011 2146   CALCIUM 9.2 05/13/2011 2146   GFRNONAA >60 05/13/2011 2146   GFRAA >60 05/13/2011 2146    Lipid Panel     Component Value Date/Time   CHOL 170 11/13/2008 0000   TRIG 46 11/13/2008 0000   HDL 41.1 11/13/2008 0000   CHOLHDL 4.1 CALC 11/13/2008  0000   VLDL 9 11/13/2008 0000   LDLCALC 120* 11/13/2008 0000    CBC    Component Value Date/Time   WBC 9.4 05/13/2011 2146   RBC 4.13 05/13/2011 2146   HGB 11.7* 05/13/2011 2146   HCT 35.7* 05/13/2011 2146   PLT 281 05/13/2011 2146   MCV 86.4 05/13/2011 2146   MCH 28.3 05/13/2011 2146   MCHC 32.8 05/13/2011 2146   RDW 13.5 05/13/2011 2146   LYMPHSABS 1.7 05/13/2011 2146   MONOABS 0.5 05/13/2011 2146   EOSABS 0.0 05/13/2011 2146   BASOSABS 0.0 05/13/2011 2146

## 2011-11-16 NOTE — Assessment & Plan Note (Signed)
Blood pressure seems to be well controlled. She's upset today about her father's diagnosis of throat cancer which is probably attributed to being elevated. I've asked her to continue the same medications, restrict salt, and begin an exercise program she would like to do. I've given her a note to clear her for exercise today. We'll check a set of electrolytes and renal function today. Also encouraged her to lose weight for better blood pressure control and prevention of onset of diabetes.

## 2011-11-16 NOTE — Patient Instructions (Signed)
**Note De-identified Galaxy Borden Obfuscation** Your physician recommends that you return for lab work in: today  Your physician recommends that you schedule a follow-up appointment in: 1 year   

## 2011-11-17 LAB — HEMOGLOBIN A1C
Hgb A1c MFr Bld: 4.7 % (ref <5.7)
Mean Plasma Glucose: 88 mg/dL (ref <117)

## 2011-11-17 LAB — BASIC METABOLIC PANEL
Chloride: 100 mEq/L (ref 96–112)
Potassium: 4.1 mEq/L (ref 3.5–5.3)
Sodium: 137 mEq/L (ref 135–145)

## 2012-01-04 ENCOUNTER — Emergency Department (HOSPITAL_COMMUNITY)
Admission: EM | Admit: 2012-01-04 | Discharge: 2012-01-04 | Disposition: A | Discharge status: Home / Self Care | Payer: Self-pay | Source: Home / Self Care | Attending: Family Medicine | Admitting: Family Medicine

## 2012-01-04 ENCOUNTER — Encounter (HOSPITAL_COMMUNITY): Payer: Self-pay | Admitting: *Deleted

## 2012-01-04 DIAGNOSIS — K047 Periapical abscess without sinus: Secondary | ICD-10-CM

## 2012-01-04 DIAGNOSIS — L52 Erythema nodosum: Secondary | ICD-10-CM

## 2012-01-04 MED ORDER — PENICILLIN V POTASSIUM 500 MG PO TABS: 500.0000 mg | ORAL_TABLET | Freq: Three times a day (TID) | ORAL | Status: AC

## 2012-01-04 MED ORDER — PREDNISONE 20 MG PO TABS: ORAL_TABLET | ORAL | Status: AC

## 2012-01-04 MED ORDER — HYDROCODONE-ACETAMINOPHEN 5-500 MG PO TABS: 1.0000 | ORAL_TABLET | Freq: Four times a day (QID) | ORAL | Status: AC | PRN

## 2012-01-04 NOTE — ED Notes (Addendum)
Left upper and lower leg ? Abscess x 3 - redness and sore onset approx 3 days - pt with left lower dental pain

## 2012-01-04 NOTE — Discharge Instructions (Signed)
Dental Abscess A dental abscess usually starts from an infected tooth. Antibiotic medicine and pain pills can be helpful, but dental infections require the attention of a dentist. Rinse around the infected area often with salt water (a pinch of salt in 8 oz of warm water). Do not apply heat to the outside of your face. See your dentist or oral surgeon as soon as possible.  SEEK IMMEDIATE MEDICAL CARE IF:  You have increasing, severe pain that is not relieved by medicine.   You or your child has an oral temperature above 102 F (38.9 C), not controlled by medicine.   Your baby is older than 3 months with a rectal temperature of 102 F (38.9 C) or higher.   Your baby is 3 months old or younger with a rectal temperature of 100.4 F (38 C) or higher.   You develop chills, severe headache, difficulty breathing, or trouble swallowing.   You have swelling in the neck or around the eye.  Document Released: 09/26/2005 Document Revised: 09/15/2011 Document Reviewed: 03/07/2007 [employer] Patient Information 2012 [employer]. 

## 2012-01-05 NOTE — ED Provider Notes (Signed)
History     CSN: [id number]  Arrival date & time 01/04/12  1758   First MD Initiated Contact with Patient 01/04/12 1841      Chief Complaint  Patient presents with  . Abscess  . Dental Pain    (Consider location/radiation/quality/duration/timing/severity/associated sxs/prior treatment) HPI Comments: 36 y/o female with h/o HTN, asthma here c/o dental pain, swelling and drainage from left lower jaw broken tooth for over 1 week. States she started to experience drainage form the affected tooth 2 days ago which has help ease her dental pain. Also noticed 3 red bumps in her left leg that feel warm and are tender to touch present for 3 days, no fever but has felt general body aches in last 2 days.   Patient is a 36 y.o. female presenting with tooth pain.  Dental PainPrimary symptoms do not include headaches, fever, shortness of breath or cough.  Additional symptoms do not include: facial swelling and ear pain.    Past Medical History  Diagnosis Date  . Hypertension   . Asthma   . Fibroid   . Anemia   . Abnormal Pap smear     Past Surgical History  Procedure Date  . Tubal ligation   . Partial hysterectomy 02/27/2011    left side  . Colposcopy   . Oophorectomy     Left    Family History  Problem Relation Age of Onset  . Hypertension Father   . Kidney disease Father   . Diabetes Maternal Grandmother   . Hypertension Paternal Grandmother     History  Substance Use Topics  . Smoking status: Never Smoker   . Smokeless tobacco: Not on file  . Alcohol Use: No    OB History    Grav Para Term Preterm Abortions TAB SAB Ect Mult Living   2 2 2       2       Review of Systems  Constitutional: Positive for chills and appetite change. Negative for fever and diaphoresis.  HENT: Positive for dental problem. Negative for ear pain, congestion, facial swelling, rhinorrhea and neck pain.   Respiratory: Negative for cough, shortness of breath and wheezing.   Cardiovascular:  Negative for chest pain.  Gastrointestinal: Negative for nausea, vomiting and abdominal pain.  Skin: Positive for rash.  Neurological: Negative for headaches.    Allergies  Sulfa antibiotics and Sulfonamide derivatives  Home Medications   Current Outpatient Rx  Name Route Sig Dispense Refill  . IRON 325 (65 FE) MG PO TABS Oral Take 1 tablet by mouth daily.      . IBUPROFEN 400 MG PO TABS Oral Take 400 mg by mouth every 6 (six) hours as needed.    Marland Kitchen POTASSIUM CHLORIDE CRYS ER 20 MEQ PO TBCR Oral Take 1 tablet (20 mEq total) by mouth 2 (two) times daily. 60 each 3  . SPIRONOLACTONE 50 MG PO TABS Oral Take 1 tablet (50 mg total) by mouth daily. 30 tablet 0  . VERAPAMIL HCL ER 180 MG PO TBCR Oral Take 2 tablets (360 mg total) by mouth daily. 60 tablet 0  . HYDROCODONE-ACETAMINOPHEN 5-500 MG PO TABS Oral Take 1-2 tablets by mouth every 6 (six) hours as needed for pain. 15 tablet 0  . PENICILLIN V POTASSIUM 500 MG PO TABS Oral Take 1 tablet (500 mg total) by mouth 3 (three) times daily. 30 tablet 0  . PREDNISONE 20 MG PO TABS  2 tabs po daily for 5 days 10 tablet no  BP 145/97  Pulse 97  Temp(Src) 100.8 F (38.2 C) (Oral)  Resp 23  SpO2 95%  LMP 12/28/2011  Physical Exam  Nursing note and vitals reviewed. Constitutional: She is oriented to person, place, and time. She appears well-developed and well-nourished. No distress.  HENT:  Head: Normocephalic and atraumatic.  Mouth/Throat: Oropharynx is clear and moist. No oropharyngeal exudate.       Broken molar with apical erythema, swelling and yellow exudate in left lowe jaw. No obvious mucosal fluctuation. No facial swelling or cellulitis.  There is noted right side slight facial asymmetry not new as per patient this is result of prior traumatic maxillary fracture in the past.  Eyes: EOM are normal. Pupils are equal, round, and reactive to light. No scleral icterus.  Neck: Neck supple. No thyromegaly present.  Cardiovascular:  Normal rate, regular rhythm and normal heart sounds.   No murmur heard. Pulmonary/Chest: Effort normal and breath sounds normal. She has no wheezes. She has no rales. She exhibits no tenderness.  Lymphadenopathy:    She has no cervical adenopathy.  Neurological: She is alert and oriented to person, place, and time.  Skin:       There are 3 red violaceous maculo-nudular patches one in distal anterolateral left thigh, one in anterior supra tibial left lower leg and one in distal left lower leg lateral to tibial bone. Very tender to palpation. No pustules or fluctuations. Impress erythema nodosum. Rest of body skin is clear.    ED Course  Procedures (including critical care time)  Labs Reviewed - No data to display No results found.   1. Dental abscess   2. Erythema nodosum       MDM  Dental abscess and impress erythema nodosum possible dental strep infection. No h/o current respiratory symptoms . No h/o sarcoidosis orTB exposure, lung Xray from 06/2011 clear. Decided to treat with penicillin, prednisone and hydrocodone. Asked to follow up with PCP during next week. Or return earlier if worsening symptoms despite following treatment.         Sharin Grave, MD 01/05/12 1250

## 2012-02-27 ENCOUNTER — Other Ambulatory Visit: Payer: Self-pay | Admitting: Cardiology

## 2012-03-22 ENCOUNTER — Emergency Department (HOSPITAL_COMMUNITY)
Admission: EM | Admit: 2012-03-22 | Discharge: 2012-03-23 | Disposition: A | Discharge status: Home / Self Care | Payer: Self-pay | Attending: Emergency Medicine | Admitting: Emergency Medicine

## 2012-03-22 ENCOUNTER — Encounter (HOSPITAL_COMMUNITY): Payer: Self-pay | Admitting: Emergency Medicine

## 2012-03-22 DIAGNOSIS — R070 Pain in throat: Secondary | ICD-10-CM | POA: Insufficient documentation

## 2012-03-22 DIAGNOSIS — K029 Dental caries, unspecified: Secondary | ICD-10-CM

## 2012-03-22 DIAGNOSIS — I10 Essential (primary) hypertension: Secondary | ICD-10-CM | POA: Insufficient documentation

## 2012-03-22 DIAGNOSIS — J029 Acute pharyngitis, unspecified: Secondary | ICD-10-CM

## 2012-03-22 DIAGNOSIS — D649 Anemia, unspecified: Secondary | ICD-10-CM | POA: Insufficient documentation

## 2012-03-22 MED ORDER — HYDROCODONE-ACETAMINOPHEN 5-500 MG PO TABS: 1.0000 | ORAL_TABLET | Freq: Four times a day (QID) | ORAL | Status: AC | PRN

## 2012-03-22 MED ORDER — AMOXICILLIN 500 MG PO CAPS: 500.0000 mg | ORAL_CAPSULE | Freq: Three times a day (TID) | ORAL | Status: AC

## 2012-03-22 NOTE — ED Notes (Signed)
PT. REPORTS SORE THROAT AND BILATERAL EYES BURNING AND LEFT LOWER MOLAR PAIN .

## 2012-03-22 NOTE — Discharge Instructions (Signed)

## 2012-03-22 NOTE — ED Provider Notes (Addendum)
History     CSN: [id number]  Arrival date & time 03/22/12  2259   First MD Initiated Contact with Patient 03/22/12 2327      Chief Complaint  Patient presents with  . Sore Throat    (Consider location/radiation/quality/duration/timing/severity/associated sxs/prior treatment) Patient is a 36 y.o. female presenting with pharyngitis. The history is provided by the patient. No language interpreter was used.  Sore Throat This is a new problem. The current episode started 2 days ago. The problem occurs constantly. The problem has not changed since onset.Pertinent negatives include no chest pain, no abdominal pain, no headaches and no shortness of breath. Nothing aggravates the symptoms. Nothing relieves the symptoms. She has tried nothing for the symptoms. The treatment provided no relief.  also has left lower tooth pain and itchy eyes.  Tooth borke  Past Medical History  Diagnosis Date  . Hypertension   . Asthma   . Fibroid   . Anemia   . Abnormal Pap smear     Past Surgical History  Procedure Date  . Tubal ligation   . Partial hysterectomy 02/27/2011    left side  . Colposcopy   . Oophorectomy     Left    Family History  Problem Relation Age of Onset  . Hypertension Father   . Kidney disease Father   . Diabetes Maternal Grandmother   . Hypertension Paternal Grandmother     History  Substance Use Topics  . Smoking status: Never Smoker   . Smokeless tobacco: Not on file  . Alcohol Use: No    OB History    Grav Para Term Preterm Abortions TAB SAB Ect Mult Living   2 2 2       2       Review of Systems  HENT: Positive for sore throat. Negative for ear pain, trouble swallowing and voice change.   Respiratory: Negative for shortness of breath.   Cardiovascular: Negative for chest pain.  Gastrointestinal: Negative for abdominal pain.  Neurological: Negative for headaches.  All other systems reviewed and are negative.    Allergies  Sulfa antibiotics and  Sulfonamide derivatives  Home Medications   Current Outpatient Rx  Name Route Sig Dispense Refill  . IRON 325 (65 FE) MG PO TABS Oral Take 1 tablet by mouth daily.      . IBUPROFEN 400 MG PO TABS Oral Take 400 mg by mouth every 6 (six) hours as needed.    Marland Kitchen POTASSIUM CHLORIDE CRYS ER 20 MEQ PO TBCR Oral Take 20 mEq by mouth 2 (two) times daily.    Marland Kitchen SPIRONOLACTONE 50 MG PO TABS Oral Take 50 mg by mouth daily.    Marland Kitchen VERAPAMIL HCL ER 180 MG PO TBCR Oral Take 360 mg by mouth daily.      BP 113/62  Pulse 102  Temp 99.2 F (37.3 C) (Oral)  Resp 18  SpO2 98%  LMP 03/16/2012  Physical Exam  Constitutional: She is oriented to person, place, and time. She appears well-developed and well-nourished.  HENT:  Head: Normocephalic and atraumatic. No trismus in the jaw.  Mouth/Throat: Oropharynx is clear and moist. No uvula swelling.    Eyes: Conjunctivae are normal. Pupils are equal, round, and reactive to light.       Normal voice  Neck: Normal range of motion. Neck supple.  Cardiovascular: Normal rate and regular rhythm.   Pulmonary/Chest: Effort normal and breath sounds normal.  Abdominal: Soft. Bowel sounds are normal. There is no tenderness. There  is no rebound and no guarding.  Musculoskeletal: Normal range of motion.  Lymphadenopathy:    She has no cervical adenopathy.  Neurological: She is alert and oriented to person, place, and time.  Skin: Skin is warm and dry.  Psychiatric: She has a normal mood and affect.    ED Course  Procedures (including critical care time)   Labs Reviewed  RAPID STREP SCREEN   No results found.   No diagnosis found.    MDM  Return for fevers > 101, worsening sore throat, change in voice, difficulty swallowing lymph gland swelling or any concerns.  Follow up with a dentist this week.  Patient verbalizes understanding and agrees to follow up  You must use condoms while taking antibiotics as antibiotics can weaken contraception.  Patient  verbalizes understanding and agrees to follow up      Kidus Delman K Fields Oros-Rasch, MD 03/22/12 2346  Keisean Skowron K Manoj Enriquez-Rasch, MD 03/22/12 [phone]

## 2012-03-23 MED ORDER — LIDOCAINE VISCOUS 2 % MT SOLN
10.0000 mL | Freq: Once | OROMUCOSAL | Status: AC
  Administered 2012-03-23: 10 mL via OROMUCOSAL
  Filled 2012-03-23: qty 15

## 2012-03-23 NOTE — ED Notes (Signed)
Pt given rx x 2, discharge and follow up instructions without further questions. Note pad broken in room. Unable to get pt to sign. Pt verbalizes understanding of instructions ambulates to lobby at time of discharge

## 2012-03-23 NOTE — ED Notes (Addendum)
Pt c/o sore throat x 2 days. Pt reports as constant. Denies any attempts to relieve pain at home. Pt also c/o left lower dental pain. Fractured tooth noted at same site

## 2012-04-17 ENCOUNTER — Encounter (HOSPITAL_COMMUNITY): Payer: Self-pay | Admitting: Physical Medicine and Rehabilitation

## 2012-04-17 DIAGNOSIS — R079 Chest pain, unspecified: Secondary | ICD-10-CM | POA: Insufficient documentation

## 2012-04-17 DIAGNOSIS — I10 Essential (primary) hypertension: Secondary | ICD-10-CM | POA: Insufficient documentation

## 2012-04-17 DIAGNOSIS — J45909 Unspecified asthma, uncomplicated: Secondary | ICD-10-CM | POA: Insufficient documentation

## 2012-04-17 LAB — CBC
MCH: 28.2 pg (ref 26.0–34.0)
MCHC: 32.6 g/dL (ref 30.0–36.0)
MCV: 86.6 fL (ref 78.0–100.0)
Platelets: 293 10*3/uL (ref 150–400)
RBC: 4.11 MIL/uL (ref 3.87–5.11)
RDW: 14 % (ref 11.5–15.5)

## 2012-04-17 LAB — POCT I-STAT TROPONIN I

## 2012-04-17 NOTE — ED Notes (Signed)
Pt presents to department for evaluation of diffuse chest pain. Onset this afternoon @ 04:00. Pt states intermittent "squeezing" sensation. Pain increases with deep breathing. Denies pain at the time. She is alert and oriented x4. Respirations unlabored.

## 2012-04-18 ENCOUNTER — Emergency Department (HOSPITAL_COMMUNITY)
Admission: EM | Admit: 2012-04-18 | Discharge: 2012-04-18 | Disposition: A | Discharge status: Home / Self Care | Payer: Self-pay | Attending: Emergency Medicine | Admitting: Emergency Medicine

## 2012-04-18 ENCOUNTER — Emergency Department (HOSPITAL_COMMUNITY): Payer: Self-pay

## 2012-04-18 DIAGNOSIS — R079 Chest pain, unspecified: Secondary | ICD-10-CM

## 2012-04-18 LAB — COMPREHENSIVE METABOLIC PANEL
ALT: 12 U/L (ref 0–35)
AST: 16 U/L (ref 0–37)
CO2: 24 mEq/L (ref 19–32)
Calcium: 9.2 mg/dL (ref 8.4–10.5)
Creatinine, Ser: 0.74 mg/dL (ref 0.50–1.10)
GFR calc non Af Amer: >90 mL/min (ref >90)
Sodium: 137 mEq/L (ref 135–145)
Total Protein: 8.1 g/dL (ref 6.0–8.3)

## 2012-04-18 MED ORDER — ALBUTEROL SULFATE (5 MG/ML) 0.5% IN NEBU
5.0000 mg | INHALATION_SOLUTION | Freq: Once | RESPIRATORY_TRACT | Status: DC
  Filled 2012-04-18: qty 40

## 2012-04-18 MED ORDER — GI COCKTAIL ~~LOC~~
30.0000 mL | Freq: Once | ORAL | Status: AC
  Administered 2012-04-18: 30 mL via ORAL
  Filled 2012-04-18: qty 30

## 2012-04-18 NOTE — ED Notes (Signed)
Pt ambulate to rr

## 2012-04-18 NOTE — ED Provider Notes (Signed)
History     CSN: [id number]  Arrival date & time 04/17/12  2230   First MD Initiated Contact with Patient 04/18/12 0020      Chief Complaint  Patient presents with  . Chest Pain  . Shortness of Breath    The history is provided by the patient.   the patient reports developing an intermittent squeezing sensation in the left side of her chest.  She reports is wheezing last for a few seconds and then let soft.  It is not worsened by exertion.  She's had no cough or congestion.  She denies fevers or chills.  She denies unilateral leg swelling.  She has no history of DVT or pulmonary embolism.  She reports the pain will be gone for several minutes and then will return in this as squeezing that again lasts for seconds.  There is no radiation of the discomfort.  She has no shortness of breath.  She's never had symptoms like this before.  She does report some left-sided upper abdominal pain 2 days ago.  She has no family history of early heart disease.  She does have a history of hypertension and asthma.    Past Medical History  Diagnosis Date  . Hypertension   . Asthma   . Fibroid   . Anemia   . Abnormal Pap smear     Past Surgical History  Procedure Date  . Tubal ligation   . Partial hysterectomy 02/27/2011    left side  . Colposcopy   . Oophorectomy     Left    Family History  Problem Relation Age of Onset  . Hypertension Father   . Kidney disease Father   . Diabetes Maternal Grandmother   . Hypertension Paternal Grandmother     History  Substance Use Topics  . Smoking status: Never Smoker   . Smokeless tobacco: Not on file  . Alcohol Use: No    OB History    Grav Para Term Preterm Abortions TAB SAB Ect Mult Living   2 2 2       2       Review of Systems  All other systems reviewed and are negative.    Allergies  Sulfa antibiotics and Sulfonamide derivatives  Home Medications   Current Outpatient Rx  Name Route Sig Dispense Refill  . IRON 325 (65 FE) MG  PO TABS Oral Take 1 tablet by mouth daily as needed.     Marland Kitchen POTASSIUM CHLORIDE CRYS ER 20 MEQ PO TBCR Oral Take 20 mEq by mouth 2 (two) times daily.    Marland Kitchen SPIRONOLACTONE 50 MG PO TABS Oral Take 50 mg by mouth daily.    Marland Kitchen VERAPAMIL HCL ER 180 MG PO TBCR Oral Take 360 mg by mouth daily.      BP 123/51  Pulse 86  Temp 98.9 F (37.2 C) (Oral)  Resp 20  SpO2 97%  LMP 03/28/2012  Physical Exam  Nursing note and vitals reviewed. Constitutional: She is oriented to person, place, and time. She appears well-developed and well-nourished. No distress.  HENT:  Head: Normocephalic and atraumatic.  Eyes: EOM are normal.  Neck: Normal range of motion.  Cardiovascular: Normal rate, regular rhythm and normal heart sounds.   Pulmonary/Chest: Effort normal and breath sounds normal.  Abdominal: Soft. She exhibits no distension. There is no tenderness.  Musculoskeletal: Normal range of motion.  Neurological: She is alert and oriented to person, place, and time.  Skin: Skin is warm and dry.  Psychiatric: She has a normal mood and affect. Judgment normal.    ED Course  Procedures (including critical care time)   Date: 04/18/2012  Rate: 107  Rhythm: normal sinus rhythm  QRS Axis: normal  Intervals: normal  ST/T Wave abnormalities: normal  Conduction Disutrbances: none  Narrative Interpretation:   Old EKG Reviewed: No significant changes noted     Labs Reviewed  CBC - Abnormal; Notable for the following:    Hemoglobin 11.6 (*)     HCT 35.6 (*)     All other components within normal limits  COMPREHENSIVE METABOLIC PANEL - Abnormal; Notable for the following:    Glucose, Bld 104 (*)     Albumin 3.3 (*)     Total Bilirubin 0.2 (*)     All other components within normal limits  POCT I-STAT TROPONIN I   Dg Chest 2 View  04/18/2012  *RADIOLOGY REPORT*  Clinical Data: Chest pain and shortness of breath  CHEST - 2 VIEW  Comparison: 09/06/2010  Findings: Normal heart size.  Clear lungs.  No  pneumothorax.  No pleural effusion.  IMPRESSION: No active cardiopulmonary disease.  Original Report Authenticated By: Donavan Burnet, M.D.    I personally reviewed the imaging tests through PACS system  I reviewed available ER/hospitalization records thought the EMR   1. Chest pain       MDM  The patient's symptoms are not consistent with ACS and is not a very good story for pulmonary embolism.  She does have mild elevated heart rate of 107 but I do not believe this to be a pulmonary embolism.  A GI cocktail was tried because some of this sounded clear may represent esophageal spasm/curbs she reports no improvement in her symptoms.  She does have a history of asthma but when offered an albuterol inhaler/nebulizer treatment which I thought may help she refused this and she stated she was not wheezing and that it would only give her palpitations.  Some of this may represent smooth muscle constriction and early asthma exacerbation the patient is unwilling to try albuterol.  The patient we discharged home with close followup with primary care physician        Lyanne Co, MD 04/18/12 [phone]

## 2012-05-02 ENCOUNTER — Other Ambulatory Visit: Payer: Self-pay | Admitting: Cardiology

## 2012-05-27 ENCOUNTER — Emergency Department (HOSPITAL_COMMUNITY): Admission: EM | Admit: 2012-05-27 | Discharge: 2012-05-27 | Payer: Self-pay | Source: Home / Self Care

## 2012-05-27 ENCOUNTER — Encounter (HOSPITAL_COMMUNITY): Payer: Self-pay | Admitting: *Deleted

## 2012-05-27 ENCOUNTER — Emergency Department (HOSPITAL_COMMUNITY)
Admission: EM | Admit: 2012-05-27 | Discharge: 2012-05-27 | Disposition: A | Discharge status: Home / Self Care | Payer: Self-pay | Attending: Emergency Medicine | Admitting: Emergency Medicine

## 2012-05-27 DIAGNOSIS — K089 Disorder of teeth and supporting structures, unspecified: Secondary | ICD-10-CM | POA: Insufficient documentation

## 2012-05-27 DIAGNOSIS — Z842 Family history of other diseases of the genitourinary system: Secondary | ICD-10-CM | POA: Insufficient documentation

## 2012-05-27 DIAGNOSIS — Z8249 Family history of ischemic heart disease and other diseases of the circulatory system: Secondary | ICD-10-CM | POA: Insufficient documentation

## 2012-05-27 DIAGNOSIS — Z882 Allergy status to sulfonamides status: Secondary | ICD-10-CM | POA: Insufficient documentation

## 2012-05-27 DIAGNOSIS — R110 Nausea: Secondary | ICD-10-CM | POA: Insufficient documentation

## 2012-05-27 DIAGNOSIS — J45909 Unspecified asthma, uncomplicated: Secondary | ICD-10-CM | POA: Insufficient documentation

## 2012-05-27 DIAGNOSIS — I10 Essential (primary) hypertension: Secondary | ICD-10-CM | POA: Insufficient documentation

## 2012-05-27 DIAGNOSIS — J020 Streptococcal pharyngitis: Secondary | ICD-10-CM | POA: Insufficient documentation

## 2012-05-27 DIAGNOSIS — Z833 Family history of diabetes mellitus: Secondary | ICD-10-CM | POA: Insufficient documentation

## 2012-05-27 LAB — RAPID STREP SCREEN (MED CTR MEBANE ONLY): Streptococcus, Group A Screen (Direct): NEGATIVE

## 2012-05-27 MED ORDER — HYDROCODONE-ACETAMINOPHEN 5-325 MG PO TABS: 2.0000 | ORAL_TABLET | ORAL | Status: AC | PRN

## 2012-05-27 MED ORDER — IBUPROFEN 800 MG PO TABS
800.0000 mg | ORAL_TABLET | Freq: Once | ORAL | Status: AC
  Administered 2012-05-27: 800 mg via ORAL
  Filled 2012-05-27: qty 1

## 2012-05-27 MED ORDER — PENICILLIN G BENZATHINE 1200000 UNIT/2ML IM SUSP
1.2000 10*6.[IU] | Freq: Once | INTRAMUSCULAR | Status: AC
  Administered 2012-05-27: 1.2 10*6.[IU] via INTRAMUSCULAR
  Filled 2012-05-27: qty 2

## 2012-05-27 NOTE — ED Notes (Signed)
Pt reports she has a broken tooth to bottom left side x months, and thinks area may be draining. Pt reports generalized pain to mouth and throat. Pt reports nausea started last night and pain started three days ago.  Pt has tried taking ibuprofen without relief.  Pt reports she may have had a fever last night.

## 2012-05-27 NOTE — ED Provider Notes (Signed)
History     CSN: [id number]  Arrival date & time 05/27/12  1330   First MD Initiated Contact with Patient 05/27/12 1446      Chief Complaint  Patient presents with  . Dental Pain  . Sore Throat  . Nausea    (Consider location/radiation/quality/duration/timing/severity/associated sxs/prior treatment) HPI Comments: Patient reports 3 day history of sore throat. She reports a severe achy pain in her throat that is made worse by swallowing and exhaling. She has tried ibuprofen for pain which eases the pain mildly. She reports a fever and cervical adenopathy. She denies cough, SOB, wheezing, NVD. No sick contacts recently.   Patient is a 36 y.o. female presenting with tooth pain and pharyngitis.  Dental PainThe primary symptoms include fever and sore throat. Primary symptoms do not include headaches, shortness of breath or cough.  Additional symptoms do not include: facial swelling and fatigue.   Sore Throat Associated symptoms include a fever and a sore throat. Pertinent negatives include no abdominal pain, arthralgias, chest pain, chills, congestion, coughing, diaphoresis, fatigue, headaches, nausea, rash, vomiting or weakness.    Past Medical History  Diagnosis Date  . Hypertension   . Asthma   . Fibroid   . Anemia   . Abnormal Pap smear     Past Surgical History  Procedure Date  . Tubal ligation   . Colposcopy   . Oophorectomy     Left    Family History  Problem Relation Age of Onset  . Hypertension Father   . Kidney disease Father   . Diabetes Maternal Grandmother   . Hypertension Paternal Grandmother     History  Substance Use Topics  . Smoking status: Never Smoker   . Smokeless tobacco: Not on file  . Alcohol Use: No    OB History    Grav Para Term Preterm Abortions TAB SAB Ect Mult Living   2 2 2       2       Review of Systems  Constitutional: Positive for fever. Negative for chills, diaphoresis and fatigue.  HENT: Positive for sore throat and  dental problem. Negative for congestion, facial swelling and sinus pressure.   Eyes: Negative for photophobia and visual disturbance.  Respiratory: Negative for cough, shortness of breath and wheezing.   Cardiovascular: Negative for chest pain.  Gastrointestinal: Negative for nausea, vomiting, abdominal pain and diarrhea.  Musculoskeletal: Negative for arthralgias.  Skin: Negative for rash and wound.  Neurological: Negative for dizziness, weakness, light-headedness and headaches.    Allergies  Sulfa antibiotics and Sulfonamide derivatives  Home Medications   Current Outpatient Rx  Name Route Sig Dispense Refill  . IRON 325 (65 FE) MG PO TABS Oral Take 1 tablet by mouth daily.     . IBUPROFEN 200 MG PO TABS Oral Take 400 mg by mouth every 6 (six) hours as needed. Pain    . ADULT MULTIVITAMIN W/MINERALS CH Oral Take 1 tablet by mouth daily.    Marland Kitchen POTASSIUM CHLORIDE CRYS ER 20 MEQ PO TBCR Oral Take 20 mEq by mouth 2 (two) times daily.    Marland Kitchen SPIRONOLACTONE 50 MG PO TABS Oral Take 50 mg by mouth daily.    Marland Kitchen VERAPAMIL HCL ER 180 MG PO TBCR Oral Take 180 mg by mouth 2 (two) times daily.       BP 158/98  Pulse 94  Temp 98.8 F (37.1 C) (Oral)  Resp 18  SpO2 99%  LMP 05/17/2012  Physical Exam  Nursing note and  vitals reviewed. Constitutional: She is oriented to person, place, and time. She appears well-developed and well-nourished.  HENT:  Head: Normocephalic and atraumatic.       Pharynx reveals erythema and swollen tonsils with white exudate.   Eyes: Conjunctivae are normal. Pupils are equal, round, and reactive to light. No scleral icterus.  Neck: Normal range of motion.  Cardiovascular: Normal rate and regular rhythm.  Exam reveals no gallop and no friction rub.   No murmur heard. Pulmonary/Chest: Effort normal and breath sounds normal. No respiratory distress. She has no wheezes. She has no rales. She exhibits no tenderness.  Abdominal: Soft. There is no tenderness.    Musculoskeletal: Normal range of motion.  Lymphadenopathy:    She has cervical adenopathy.  Neurological: She is alert and oriented to person, place, and time.  Skin: Skin is warm and dry. She is not diaphoretic.  Psychiatric: She has a normal mood and affect. Her behavior is normal.    ED Course  Procedures (including critical care time)   Labs Reviewed  RAPID STREP SCREEN   No results found.   No diagnosis found.    MDM  3:34 PM Patient looks sick and feels very ill. I will treat her for strep throat despite her rapid strep being negative. Patient will receive 1.2 million units of Pen G here. I will send her home with pain medication for throat pain.          Emilia Beck, PA-C 05/27/12 1653

## 2012-05-27 NOTE — ED Notes (Signed)
Discharge instructions reviewed w/ pt., verbalizes understanding. One prescription provided at discharge. 

## 2012-05-29 NOTE — ED Provider Notes (Signed)
Medical screening examination/treatment/procedure(s) were performed by non-physician practitioner and as supervising physician I was immediately available for consultation/collaboration.    Arinze Rivadeneira L Fabricio Endsley, MD 05/29/12 2249 

## 2012-06-06 ENCOUNTER — Other Ambulatory Visit: Payer: Self-pay | Admitting: Cardiology

## 2012-06-07 NOTE — Telephone Encounter (Signed)
Refilled verapamil.

## 2012-07-15 ENCOUNTER — Encounter (HOSPITAL_COMMUNITY): Payer: Self-pay | Admitting: *Deleted

## 2012-07-15 ENCOUNTER — Emergency Department (HOSPITAL_COMMUNITY)
Admission: EM | Admit: 2012-07-15 | Discharge: 2012-07-15 | Disposition: A | Discharge status: Home / Self Care | Payer: Medicaid Other | Attending: Emergency Medicine | Admitting: Emergency Medicine

## 2012-07-15 DIAGNOSIS — L02419 Cutaneous abscess of limb, unspecified: Secondary | ICD-10-CM | POA: Insufficient documentation

## 2012-07-15 DIAGNOSIS — Z882 Allergy status to sulfonamides status: Secondary | ICD-10-CM | POA: Insufficient documentation

## 2012-07-15 DIAGNOSIS — K0889 Other specified disorders of teeth and supporting structures: Secondary | ICD-10-CM

## 2012-07-15 DIAGNOSIS — K029 Dental caries, unspecified: Secondary | ICD-10-CM | POA: Insufficient documentation

## 2012-07-15 DIAGNOSIS — I10 Essential (primary) hypertension: Secondary | ICD-10-CM | POA: Insufficient documentation

## 2012-07-15 DIAGNOSIS — J45909 Unspecified asthma, uncomplicated: Secondary | ICD-10-CM | POA: Insufficient documentation

## 2012-07-15 DIAGNOSIS — J029 Acute pharyngitis, unspecified: Secondary | ICD-10-CM | POA: Insufficient documentation

## 2012-07-15 DIAGNOSIS — L02416 Cutaneous abscess of left lower limb: Secondary | ICD-10-CM

## 2012-07-15 LAB — RAPID STREP SCREEN (MED CTR MEBANE ONLY): Streptococcus, Group A Screen (Direct): NEGATIVE

## 2012-07-15 MED ORDER — AMOXICILLIN 500 MG PO CAPS: 500.0000 mg | ORAL_CAPSULE | Freq: Three times a day (TID) | ORAL | Status: DC

## 2012-07-15 MED ORDER — TRAMADOL HCL 50 MG PO TABS: 50.0000 mg | ORAL_TABLET | Freq: Four times a day (QID) | ORAL | Status: DC | PRN

## 2012-07-15 MED ORDER — HYDROCODONE-ACETAMINOPHEN 7.5-500 MG/15ML PO SOLN
10.0000 mL | Freq: Once | ORAL | Status: AC
  Administered 2012-07-15: 10 mL via ORAL
  Filled 2012-07-15: qty 15

## 2012-07-15 NOTE — ED Notes (Signed)
Pt c/o sore throat/neck pain; boil inside left inner thigh

## 2012-07-15 NOTE — ED Provider Notes (Signed)
Medical screening examination/treatment/procedure(s) were performed by non-physician practitioner and as supervising physician I was immediately available for consultation/collaboration.  Olivia Mackie, MD 07/15/12 [phone]

## 2012-07-15 NOTE — ED Notes (Signed)
PA at bedside.

## 2012-07-15 NOTE — ED Provider Notes (Signed)
History     CSN: [id number]  Arrival date & time 07/15/12  9629   First MD Initiated Contact with Patient 07/15/12 [phone]      Chief Complaint  Patient presents with  . Sore Throat    (Consider location/radiation/quality/duration/timing/severity/associated sxs/prior treatment) HPI Comments: Tammie [name] is a 36 y.o. Female who presented with complaint of sore throat and swelling to the left thigh. Pt states she has had sore throat for a month now. State was seen here at that time, was told she may have strep. States had penicillin shot which helped for about a week, but pain came back. States also has a bad tooth. A recent root canal, but states crown broke off a day after.  Pt denies fever, chills, malaise. States also has a swelling to the left inner thigh. States noted it few days ago. States it is not tender, no drainage, no redness. No hx of the same.   The history is provided by the patient.    Past Medical History  Diagnosis Date  . Hypertension   . Asthma   . Fibroid   . Anemia   . Abnormal Pap smear     Past Surgical History  Procedure Date  . Tubal ligation   . Colposcopy   . Oophorectomy     Left    Family History  Problem Relation Age of Onset  . Hypertension Father   . Kidney disease Father   . Diabetes Maternal Grandmother   . Hypertension Paternal Grandmother     History  Substance Use Topics  . Smoking status: Never Smoker   . Smokeless tobacco: Not on file  . Alcohol Use: No    OB History    Grav Para Term Preterm Abortions TAB SAB Ect Mult Living   2 2 2       2       Review of Systems  Constitutional: Negative for fever and chills.  HENT: Positive for sore throat and dental problem. Negative for congestion, neck pain and neck stiffness.   Respiratory: Negative.   Cardiovascular: Negative.   Gastrointestinal: Negative.   Genitourinary: Negative for dysuria and flank pain.  Musculoskeletal: Negative.   Skin: Positive for wound.    Neurological: Negative for dizziness, weakness and headaches.  Hematological: Negative for adenopathy.    Allergies  Sulfa antibiotics and Sulfonamide derivatives  Home Medications   Current Outpatient Rx  Name Route Sig Dispense Refill  . IRON 325 (65 FE) MG PO TABS Oral Take 1 tablet by mouth every other day.     . IBUPROFEN 200 MG PO TABS Oral Take 400 mg by mouth every 6 (six) hours as needed. Pain    . ADULT MULTIVITAMIN W/MINERALS CH Oral Take 1 tablet by mouth daily.    Marland Kitchen POTASSIUM CHLORIDE CRYS ER 20 MEQ PO TBCR Oral Take 20 mEq by mouth 2 (two) times daily.    Marland Kitchen SPIRONOLACTONE 50 MG PO TABS Oral Take 50 mg by mouth daily.    Marland Kitchen VERAPAMIL HCL ER 180 MG PO TBCR Oral Take 180 mg by mouth 2 (two) times daily.       BP 153/68  Pulse 90  Temp 99.1 F (37.3 C) (Oral)  Resp 20  Wt 298 lb 4 oz (135.285 kg)  SpO2 98%  LMP 07/08/2012  Physical Exam  Nursing note and vitals reviewed. Constitutional: She is oriented to person, place, and time. She appears well-developed and well-nourished. No distress.  HENT:  Head: Normocephalic and  atraumatic.  Right Ear: External ear normal.  Left Ear: External ear normal.  Nose: Nose normal.  Mouth/Throat: Oropharynx is clear and moist.       TMs normal bilaterally. Tonsils enlarged. No exudate. Uvula midline. Multiple dental carries. Tender around left lower 1st molar which is avulsed.   Eyes: Conjunctivae normal are normal.  Neck: Neck supple.  Cardiovascular: Normal rate, regular rhythm and normal heart sounds.   Pulmonary/Chest: Effort normal and breath sounds normal. No respiratory distress. She has no wheezes. She has no rales.  Lymphadenopathy:    She has no cervical adenopathy.  Neurological: She is alert and oriented to person, place, and time.  Skin: Skin is warm and dry.       1cm indurated, non tender abscess/cyst to the medial left thigh    ED Course  Procedures (including critical care time)  Results for orders  placed during the hospital encounter of 07/15/12  RAPID STREP SCREEN      Component Value Range   Streptococcus, Group A Screen (Direct) NEGATIVE  NEGATIVE  MONONUCLEOSIS SCREEN      Component Value Range   Mono Screen NEGATIVE  NEGATIVE       1. Sore throat   2. Pain, dental   3. Abscess of left thigh       MDM  Pt with sore throat for a month now. Exam unremarkable. No signs of parapharyngeal abscess. Strep and mono negative. Pt does have multiple carries and tender around left lower 1st molar, wonder if pt having referred pain in her throat from her tooth. Abscess to left lower leg non tender, does not need I&D at this time, recommended warm compresses. Will start on amoxil for possible dental abscess and follow up. She has an apt with  Dentist in one week        Lottie Mussel, [place] 07/15/12 [phone]

## 2012-07-24 ENCOUNTER — Emergency Department (HOSPITAL_COMMUNITY): Payer: Medicaid Other

## 2012-07-24 ENCOUNTER — Emergency Department (HOSPITAL_COMMUNITY)
Admission: EM | Admit: 2012-07-24 | Discharge: 2012-07-25 | Disposition: A | Discharge status: Home / Self Care | Payer: Medicaid Other | Attending: Emergency Medicine | Admitting: Emergency Medicine

## 2012-07-24 ENCOUNTER — Encounter (HOSPITAL_COMMUNITY): Payer: Self-pay | Admitting: Emergency Medicine

## 2012-07-24 DIAGNOSIS — Z79899 Other long term (current) drug therapy: Secondary | ICD-10-CM | POA: Insufficient documentation

## 2012-07-24 DIAGNOSIS — R079 Chest pain, unspecified: Secondary | ICD-10-CM

## 2012-07-24 DIAGNOSIS — I10 Essential (primary) hypertension: Secondary | ICD-10-CM | POA: Insufficient documentation

## 2012-07-24 DIAGNOSIS — J45909 Unspecified asthma, uncomplicated: Secondary | ICD-10-CM | POA: Insufficient documentation

## 2012-07-24 LAB — POCT I-STAT TROPONIN I: Troponin i, poc: 0 ng/mL (ref 0.00–0.08)

## 2012-07-24 LAB — BASIC METABOLIC PANEL
Calcium: 9.5 mg/dL (ref 8.4–10.5)
Chloride: 100 mEq/L (ref 96–112)
Creatinine, Ser: 0.69 mg/dL (ref 0.50–1.10)
GFR calc Af Amer: >90 mL/min (ref >90)
Sodium: 135 mEq/L (ref 135–145)

## 2012-07-24 LAB — CBC
MCH: 27.8 pg (ref 26.0–34.0)
MCV: 86.8 fL (ref 78.0–100.0)
Platelets: 379 10*3/uL (ref 150–400)
RDW: 13.9 % (ref 11.5–15.5)
WBC: 14 10*3/uL — ABNORMAL HIGH (ref 4.0–10.5)

## 2012-07-24 MED ORDER — HYDROCODONE-ACETAMINOPHEN 5-325 MG PO TABS
1.0000 | ORAL_TABLET | Freq: Once | ORAL | Status: AC
  Administered 2012-07-24: 1 via ORAL
  Filled 2012-07-24: qty 1

## 2012-07-24 MED ORDER — PANTOPRAZOLE SODIUM 40 MG PO TBEC
40.0000 mg | DELAYED_RELEASE_TABLET | Freq: Every day | ORAL | Status: DC
  Administered 2012-07-24: 40 mg via ORAL
  Filled 2012-07-24: qty 1

## 2012-07-24 NOTE — ED Notes (Signed)
Pt c/o left sided CP x 3 days worse with inspiration; pt denies SOB

## 2012-07-24 NOTE — ED Notes (Signed)
Pt c/o chest pain when breathing in; pt reports pain radiating to her left rib cage; pain started about two days ago; pt describes pain as "dull squeezing type pain";

## 2012-07-25 MED ORDER — HYDROCODONE-ACETAMINOPHEN 5-325 MG PO TABS: 1.0000 | ORAL_TABLET | Freq: Once | ORAL | Status: DC

## 2012-07-25 MED ORDER — OMEPRAZOLE 20 MG PO CPDR: 20.0000 mg | DELAYED_RELEASE_CAPSULE | Freq: Every day | ORAL | Status: DC

## 2012-07-25 NOTE — ED Provider Notes (Signed)
Medical screening examination/treatment/procedure(s) were performed by non-physician practitioner and as supervising physician I was immediately available for consultation/collaboration.  Khala Tarte, MD 07/25/12 0109 

## 2012-07-25 NOTE — ED Provider Notes (Signed)
History     CSN: [id number]  Arrival date & time 07/24/12  4098   First MD Initiated Contact with Patient 07/24/12 2252      Chief Complaint  Patient presents with  . Chest Pain    (Consider location/radiation/quality/duration/timing/severity/associated sxs/prior treatment) Patient is a 36 y.o. female presenting with chest pain. The history is provided by the patient.  Chest Pain The chest pain began 3 - 5 days ago. The quality of the pain is described as aching. Pertinent negatives for primary symptoms include no fever, no shortness of breath and no abdominal pain. Associated symptoms comments: Pain in the left chest for the past 3 days. No cough or fever. It has been constant without aggravating or alleviating factors. She has not had any SOB or vomiting. The pain radiates into the left posterior shoulder. .     Past Medical History  Diagnosis Date  . Hypertension   . Asthma   . Fibroid   . Anemia   . Abnormal Pap smear     Past Surgical History  Procedure Date  . Tubal ligation   . Colposcopy   . Oophorectomy     Left    Family History  Problem Relation Age of Onset  . Hypertension Father   . Kidney disease Father   . Diabetes Maternal Grandmother   . Hypertension Paternal Grandmother     History  Substance Use Topics  . Smoking status: Never Smoker   . Smokeless tobacco: Not on file  . Alcohol Use: No    OB History    Grav Para Term Preterm Abortions TAB SAB Ect Mult Living   2 2 2       2       Review of Systems  Constitutional: Negative for fever.  Respiratory: Negative for shortness of breath.   Cardiovascular: Positive for chest pain.  Gastrointestinal: Negative for abdominal pain.  Neurological: Negative for light-headedness.    Allergies  Sulfa antibiotics  Home Medications   Current Outpatient Rx  Name Route Sig Dispense Refill  . BC HEADACHE POWDER PO Oral Take 1 packet by mouth daily as needed. For pain    . IRON 325 (65 FE) MG  PO TABS Oral Take 1 tablet by mouth every other day.     . IBUPROFEN 200 MG PO TABS Oral Take 400 mg by mouth every 6 (six) hours as needed. Pain    . POTASSIUM CHLORIDE CRYS ER 20 MEQ PO TBCR Oral Take 20 mEq by mouth 2 (two) times daily.    Marland Kitchen SPIRONOLACTONE 50 MG PO TABS Oral Take 50 mg by mouth daily.    Marland Kitchen VERAPAMIL HCL ER 180 MG PO TBCR Oral Take 180 mg by mouth 2 (two) times daily.       BP 143/78  Pulse 85  Temp 99.4 F (37.4 C) (Oral)  Resp 17  SpO2 98%  LMP 07/08/2012  Physical Exam  Constitutional: She is oriented to person, place, and time. She appears well-developed and well-nourished.  HENT:  Head: Normocephalic.  Neck: Normal range of motion. Neck supple.  Cardiovascular: Normal rate and regular rhythm.   Pulmonary/Chest: Effort normal and breath sounds normal. She has no wheezes. She has no rales. She exhibits no tenderness.  Abdominal: Soft. Bowel sounds are normal. There is no tenderness. There is no rebound and no guarding.  Musculoskeletal: Normal range of motion.  Neurological: She is alert and oriented to person, place, and time. A cranial nerve deficit is present.  Skin: Skin is warm and dry. No rash noted.  Psychiatric: She has a normal mood and affect.    ED Course  Procedures (including critical care time)  Labs Reviewed  CBC - Abnormal; Notable for the following:    WBC 14.0 (*)     Hemoglobin 11.8 (*)     All other components within normal limits  BASIC METABOLIC PANEL - Abnormal; Notable for the following:    Glucose, Bld 113 (*)     All other components within normal limits  POCT I-STAT TROPONIN I  POCT PREGNANCY, URINE   Dg Chest 2 View  07/24/2012  *RADIOLOGY REPORT*  Clinical Data: Left-sided chest pain for two or 3 days.  Radiation down the left arm and left side.  Shortness of breath.  CHEST - 2 VIEW  Comparison: 04/18/2012  Findings: Slightly shallow inspiration. The heart size and pulmonary vascularity are normal. The lungs appear clear  and expanded without focal air space disease or consolidation. No blunting of the costophrenic angles.  No pneumothorax.  Mediastinal contours are intact.  Tortuous and mildly ectatic aorta.  No significant change since previous study.  IMPRESSION: No evidence of active pulmonary disease.   Original Report Authenticated By: Marlon Pel, M.D.      No diagnosis found.  1. Chest pain   MDM  3 days duration with negative EKG and troponin - doubt ACS. No infection on x-ray, no pulm edema - doubt pulmonary source of pain. Chest wall is not reproducibly tender - doubt muscular pain. Will try Prilosec and close follow up with PCP.        Rodena Medin, PA-C 07/25/12 0022

## 2012-09-28 ENCOUNTER — Ambulatory Visit: Payer: Self-pay | Admitting: Advanced Practice Midwife

## 2012-10-12 ENCOUNTER — Ambulatory Visit: Payer: Self-pay | Admitting: Obstetrics and Gynecology

## 2012-11-17 ENCOUNTER — Other Ambulatory Visit: Payer: Self-pay | Admitting: Cardiology

## 2012-11-20 ENCOUNTER — Telehealth: Payer: Self-pay | Admitting: Cardiology

## 2012-11-20 ENCOUNTER — Ambulatory Visit: Payer: Self-pay | Admitting: Cardiology

## 2012-11-20 NOTE — Telephone Encounter (Signed)
No showed appointment.  Message left for patient to call office to reschedule.   Letter also mailed. / tgs  °

## 2012-11-21 ENCOUNTER — Ambulatory Visit: Payer: Self-pay | Admitting: Obstetrics & Gynecology

## 2012-12-02 ENCOUNTER — Emergency Department (HOSPITAL_COMMUNITY)
Admission: EM | Admit: 2012-12-02 | Discharge: 2012-12-02 | Disposition: A | Discharge status: Home / Self Care | Payer: Medicaid Other | Attending: Emergency Medicine | Admitting: Emergency Medicine

## 2012-12-02 ENCOUNTER — Encounter (HOSPITAL_COMMUNITY): Payer: Self-pay | Admitting: Emergency Medicine

## 2012-12-02 DIAGNOSIS — K0889 Other specified disorders of teeth and supporting structures: Secondary | ICD-10-CM

## 2012-12-02 DIAGNOSIS — K0381 Cracked tooth: Secondary | ICD-10-CM | POA: Insufficient documentation

## 2012-12-02 DIAGNOSIS — I10 Essential (primary) hypertension: Secondary | ICD-10-CM | POA: Insufficient documentation

## 2012-12-02 DIAGNOSIS — R509 Fever, unspecified: Secondary | ICD-10-CM | POA: Insufficient documentation

## 2012-12-02 DIAGNOSIS — J45909 Unspecified asthma, uncomplicated: Secondary | ICD-10-CM | POA: Insufficient documentation

## 2012-12-02 DIAGNOSIS — R110 Nausea: Secondary | ICD-10-CM | POA: Insufficient documentation

## 2012-12-02 DIAGNOSIS — Z8742 Personal history of other diseases of the female genital tract: Secondary | ICD-10-CM | POA: Insufficient documentation

## 2012-12-02 DIAGNOSIS — D649 Anemia, unspecified: Secondary | ICD-10-CM | POA: Insufficient documentation

## 2012-12-02 MED ORDER — OXYCODONE-ACETAMINOPHEN 5-325 MG PO TABS
1.0000 | ORAL_TABLET | Freq: Once | ORAL | Status: AC
  Administered 2012-12-02: 1 via ORAL
  Filled 2012-12-02: qty 1

## 2012-12-02 MED ORDER — OXYCODONE-ACETAMINOPHEN 5-325 MG PO TABS: ORAL_TABLET | ORAL | Status: DC

## 2012-12-02 MED ORDER — PENICILLIN V POTASSIUM 250 MG PO TABS: 250.0000 mg | ORAL_TABLET | Freq: Four times a day (QID) | ORAL | Status: AC

## 2012-12-02 NOTE — ED Provider Notes (Signed)
History     CSN: [id number]  Arrival date & time 12/02/12  0904   First MD Initiated Contact with Patient 12/02/12 [phone]      Chief Complaint  Patient presents with  . Dental Pain    (Consider location/radiation/quality/duration/timing/severity/associated sxs/prior treatment) HPI  Tammie [name] is a 37 y.o. female complaining of pain exacerbation to tooth of the lower left side. Patient had a root canal about a year ago and broke a tooth in November. She's not had a chance to follow with her dentist as her father has been diagnosed with stage IV cancer she's been caring for him. She does endorse subjective fever and chills with nausea and foul taste in her mouth starting yesterday. She denies any swelling to the area. Denies any difficulty controlling her secretions or sharp increase in her pain.  Past Medical History  Diagnosis Date  . Hypertension   . Asthma   . Fibroid   . Anemia   . Abnormal Pap smear     Past Surgical History  Procedure Laterality Date  . Tubal ligation    . Colposcopy    . Oophorectomy      Left    Family History  Problem Relation Age of Onset  . Hypertension Father   . Kidney disease Father   . Diabetes Maternal Grandmother   . Hypertension Paternal Grandmother     History  Substance Use Topics  . Smoking status: Never Smoker   . Smokeless tobacco: Not on file  . Alcohol Use: No    OB History   Grav Para Term Preterm Abortions TAB SAB Ect Mult Living   2 2 2       2       Review of Systems  Constitutional: Negative for fever.  HENT: Positive for dental problem. Negative for trouble swallowing.   Respiratory: Negative for shortness of breath.   Cardiovascular: Negative for chest pain.  Gastrointestinal: Negative for nausea, vomiting, abdominal pain and diarrhea.  All other systems reviewed and are negative.    Allergies  Sulfa antibiotics  Home Medications   Current Outpatient Rx  Name  Route  Sig  Dispense  Refill  .  Ferrous Sulfate (IRON) 325 (65 FE) MG TABS   Oral   Take 1 tablet by mouth every other day.          . ibuprofen (ADVIL,MOTRIN) 200 MG tablet   Oral   Take 400 mg by mouth every 6 (six) hours as needed. Pain         . potassium chloride SA (K-DUR,KLOR-CON) 20 MEQ tablet   Oral   Take 20 mEq by mouth 2 (two) times daily.         Marland Kitchen spironolactone (ALDACTONE) 50 MG tablet   Oral   Take 50 mg by mouth daily.         . verapamil (CALAN-SR) 180 MG CR tablet   Oral   Take 180 mg by mouth 2 (two) times daily.            BP 133/85  Pulse 102  Temp(Src) 98.6 F (37 C) (Oral)  Resp 18  SpO2 98%  LMP 11/26/2012  Physical Exam  Nursing note and vitals reviewed. Constitutional: She is oriented to person, place, and time. She appears well-developed and well-nourished. No distress.  HENT:  Head: Normocephalic.  Mouth/Throat:    Reviewed and fractured tooth. No swelling or signs of abscess.  Eyes: Conjunctivae and EOM are normal.  Cardiovascular: Normal  rate.   Pulmonary/Chest: Effort normal. No stridor.  Musculoskeletal: Normal range of motion.  Neurological: She is alert and oriented to person, place, and time.  Psychiatric: She has a normal mood and affect.    ED Course  Procedures (including critical care time)  Labs Reviewed - No data to display No results found.   1. Pain, dental       MDM  Uncomplicated dental pain. I will write her a prescription for penicillin for a Flagyl prophylactically. Encourage followup with oral surgeon. Return precautions given.   Discharge Medication List as of 12/02/2012  9:56 AM    START taking these medications   Details  oxyCODONE-acetaminophen (PERCOCET/ROXICET) 5-325 MG per tablet 1 to 2 tabs PO q6hrs  PRN for pain, Print    penicillin v potassium (VEETID) 250 MG tablet Take 1 tablet (250 mg total) by mouth 4 (four) times daily., Starting 12/02/2012, Last dose on Sun 12/09/12, [facility], PA-C 12/02/12 1551

## 2012-12-02 NOTE — ED Notes (Signed)
Pt states that she has had a broken tooth since the beginning of December.  States she was referred to a Designer, industrial/product but has not been yet due to family problems.  C/o pain 6/10.

## 2012-12-02 NOTE — ED Provider Notes (Signed)
Medical screening examination/treatment/procedure(s) were performed by non-physician practitioner and as supervising physician I was immediately available for consultation/collaboration.   Gwyneth Sprout, MD 12/02/12 [phone]

## 2012-12-10 ENCOUNTER — Ambulatory Visit: Payer: Self-pay | Admitting: Obstetrics & Gynecology

## 2012-12-27 ENCOUNTER — Ambulatory Visit (INDEPENDENT_AMBULATORY_CARE_PROVIDER_SITE_OTHER): Payer: Medicaid Other | Admitting: Obstetrics and Gynecology

## 2012-12-27 ENCOUNTER — Encounter: Payer: Self-pay | Admitting: Obstetrics and Gynecology

## 2012-12-27 ENCOUNTER — Other Ambulatory Visit (HOSPITAL_COMMUNITY)
Admission: RE | Admit: 2012-12-27 | Discharge: 2012-12-27 | Disposition: A | Discharge status: Home / Self Care | Payer: Medicaid Other | Source: Ambulatory Visit | Attending: Obstetrics and Gynecology | Admitting: Obstetrics and Gynecology

## 2012-12-27 VITALS — BP 158/93 | HR 103 | Temp 97.3°F | Ht 69.5 in | Wt 274.8 lb

## 2012-12-27 DIAGNOSIS — R102 Pelvic and perineal pain: Secondary | ICD-10-CM

## 2012-12-27 DIAGNOSIS — N949 Unspecified condition associated with female genital organs and menstrual cycle: Secondary | ICD-10-CM

## 2012-12-27 DIAGNOSIS — I10 Essential (primary) hypertension: Secondary | ICD-10-CM

## 2012-12-27 DIAGNOSIS — Z01419 Encounter for gynecological examination (general) (routine) without abnormal findings: Secondary | ICD-10-CM

## 2012-12-27 DIAGNOSIS — Z1151 Encounter for screening for human papillomavirus (HPV): Secondary | ICD-10-CM | POA: Insufficient documentation

## 2012-12-27 NOTE — Progress Notes (Signed)
  Subjective:     Tammie [name] is a 37 y.o. female G2P2 with LMP 12/15/2012 who is here for a comprehensive physical exam. The patient reports LLQ pain. Patient had a lsc left salpingo-oophorectomy in 2012 secondary to ovarian leiomyomata and states her pain is similar. She describes it as a dull pain that occurs twice a week radiating to her left upper thigh. There are no aggrevating or alleviating factors. She also reports heavier cycles with dysmenorrhea since her BTL but does not really want to do anything about it at this time  History   Social History  . Marital Status: Single    Spouse Name: N/A    Number of Children: N/A  . Years of Education: N/A   Occupational History  . Not on file.   Social History Main Topics  . Smoking status: Never Smoker   . Smokeless tobacco: Not on file  . Alcohol Use: No  . Drug Use: No  . Sexually Active: Yes    Birth Control/ Protection: Other-see comments, Condom   Other Topics Concern  . Not on file   Social History Narrative  . No narrative on file   Health Maintenance  Topic Date Due  . Influenza Vaccine  06/10/1978  . Tetanus/tdap  11/15/1996  . Pap Smear  12/19/2013       Review of Systems A comprehensive review of systems was negative.   Objective:      GENERAL: Well-developed, well-nourished female in no acute distress.  HEENT: Normocephalic, atraumatic. Sclerae anicteric.  NECK: Supple. Normal thyroid.  LUNGS: Clear to auscultation bilaterally.  HEART: Regular rate and rhythm. BREASTS: Symmetric in size. No palpable masses or lymphadenopathy, skin changes, or nipple drainage. ABDOMEN: Soft, nontender, nondistended. No organomegaly. PELVIC: Normal external female genitalia. Vagina is pink and rugated.  Normal discharge. Normal appearing cervix. Uterus is normal in size. No adnexal mass or tenderness. EXTREMITIES: No cyanosis, clubbing, or edema, 2+ distal pulses.  Assessment:    Healthy female exam.      Plan:     Pap smear performed Referral for pelvic ultrasound Discussed medical management of dysmenorrhea with ibuprofen and birth control. Patient does not wish to address this issue at this time. See After Visit Summary for Counseling Recommendations

## 2012-12-27 NOTE — Patient Instructions (Signed)
Preventive Care for Adults, Female A healthy lifestyle and preventive care can promote health and wellness. Preventive health guidelines for women include the following key practices.  A routine yearly physical is a good way to check with your caregiver about your health and preventive screening. It is a chance to share any concerns and updates on your health, and to receive a thorough exam.  Visit your dentist for a routine exam and preventive care every 6 months. Brush your teeth twice a day and floss once a day. Good oral hygiene prevents tooth decay and gum disease.  The frequency of eye exams is based on your age, health, family medical history, use of contact lenses, and other factors. Follow your caregiver's recommendations for frequency of eye exams.  Eat a healthy diet. Foods like vegetables, fruits, whole grains, low-fat dairy products, and lean protein foods contain the nutrients you need without too many calories. Decrease your intake of foods high in solid fats, added sugars, and salt. Eat the right amount of calories for you.Get information about a proper diet from your caregiver, if necessary.  Regular physical exercise is one of the most important things you can do for your health. Most adults should get at least 150 minutes of moderate-intensity exercise (any activity that increases your heart rate and causes you to sweat) each week. In addition, most adults need muscle-strengthening exercises on 2 or more days a week.  Maintain a healthy weight. The body mass index (BMI) is a screening tool to identify possible weight problems. It provides an estimate of body fat based on height and weight. Your caregiver can help determine your BMI, and can help you achieve or maintain a healthy weight.For adults 20 years and older:  A BMI below 18.5 is considered underweight.  A BMI of 18.5 to 24.9 is normal.  A BMI of 25 to 29.9 is considered overweight.  A BMI of 30 and above is  considered obese.  Maintain normal blood lipids and cholesterol levels by exercising and minimizing your intake of saturated fat. Eat a balanced diet with plenty of fruit and vegetables. Blood tests for lipids and cholesterol should begin at age 20 and be repeated every 5 years. If your lipid or cholesterol levels are high, you are over 50, or you are at high risk for heart disease, you may need your cholesterol levels checked more frequently.Ongoing high lipid and cholesterol levels should be treated with medicines if diet and exercise are not effective.  If you smoke, find out from your caregiver how to quit. If you do not use tobacco, do not start.  If you are pregnant, do not drink alcohol. If you are breastfeeding, be very cautious about drinking alcohol. If you are not pregnant and choose to drink alcohol, do not exceed 1 drink per day. One drink is considered to be 12 ounces (355 mL) of beer, 5 ounces (148 mL) of wine, or 1.5 ounces (44 mL) of liquor.  Avoid use of street drugs. Do not share needles with anyone. Ask for help if you need support or instructions about stopping the use of drugs.  High blood pressure causes heart disease and increases the risk of stroke. Your blood pressure should be checked at least every 1 to 2 years. Ongoing high blood pressure should be treated with medicines if weight loss and exercise are not effective.  If you are 55 to 35 years old, ask your caregiver if you should take aspirin to prevent strokes.  Diabetes   screening involves taking a blood sample to check your fasting blood sugar level. This should be done once every 3 years, after age 45, if you are within normal weight and without risk factors for diabetes. Testing should be considered at a younger age or be carried out more frequently if you are overweight and have at least 1 risk factor for diabetes.  Breast cancer screening is essential preventive care for women. You should practice "breast  self-awareness." This means understanding the normal appearance and feel of your breasts and may include breast self-examination. Any changes detected, no matter how small, should be reported to a caregiver. Women in their 20s and 30s should have a clinical breast exam (CBE) by a caregiver as part of a regular health exam every 1 to 3 years. After age 40, women should have a CBE every year. Starting at age 40, women should consider having a mammography (breast X-ray test) every year. Women who have a family history of breast cancer should talk to their caregiver about genetic screening. Women at a high risk of breast cancer should talk to their caregivers about having magnetic resonance imaging (MRI) and a mammography every year.  The Pap test is a screening test for cervical cancer. A Pap test can show cell changes on the cervix that might become cervical cancer if left untreated. A Pap test is a procedure in which cells are obtained and examined from the lower end of the uterus (cervix).  Women should have a Pap test starting at age 21.  Between ages 21 and 29, Pap tests should be repeated every 2 years.  Beginning at age 30, you should have a Pap test every 3 years as long as the past 3 Pap tests have been normal.  Some women have medical problems that increase the chance of getting cervical cancer. Talk to your caregiver about these problems. It is especially important to talk to your caregiver if a new problem develops soon after your last Pap test. In these cases, your caregiver may recommend more frequent screening and Pap tests.  The above recommendations are the same for women who have or have not gotten the vaccine for human papillomavirus (HPV).  If you had a hysterectomy for a problem that was not cancer or a condition that could lead to cancer, then you no longer need Pap tests. Even if you no longer need a Pap test, a regular exam is a good idea to make sure no other problems are  starting.  If you are between ages 65 and 70, and you have had normal Pap tests going back 10 years, you no longer need Pap tests. Even if you no longer need a Pap test, a regular exam is a good idea to make sure no other problems are starting.  If you have had past treatment for cervical cancer or a condition that could lead to cancer, you need Pap tests and screening for cancer for at least 20 years after your treatment.  If Pap tests have been discontinued, risk factors (such as a new sexual partner) need to be reassessed to determine if screening should be resumed.  The HPV test is an additional test that may be used for cervical cancer screening. The HPV test looks for the virus that can cause the cell changes on the cervix. The cells collected during the Pap test can be tested for HPV. The HPV test could be used to screen women aged 30 years and older, and should   be used in women of any age who have unclear Pap test results. After the age of 30, women should have HPV testing at the same frequency as a Pap test.  Colorectal cancer can be detected and often prevented. Most routine colorectal cancer screening begins at the age of 50 and continues through age 75. However, your caregiver may recommend screening at an earlier age if you have risk factors for colon cancer. On a yearly basis, your caregiver may provide home test kits to check for hidden blood in the stool. Use of a small camera at the end of a tube, to directly examine the colon (sigmoidoscopy or colonoscopy), can detect the earliest forms of colorectal cancer. Talk to your caregiver about this at age 50, when routine screening begins. Direct examination of the colon should be repeated every 5 to 10 years through age 75, unless early forms of pre-cancerous polyps or small growths are found.  Hepatitis C blood testing is recommended for all people born from 1945 through 1965 and any individual with known risks for hepatitis C.  Practice  safe sex. Use condoms and avoid high-risk sexual practices to reduce the spread of sexually transmitted infections (STIs). STIs include gonorrhea, chlamydia, syphilis, trichomonas, herpes, HPV, and human immunodeficiency virus (HIV). Herpes, HIV, and HPV are viral illnesses that have no cure. They can result in disability, cancer, and death. Sexually active women aged 25 and younger should be checked for chlamydia. Older women with new or multiple partners should also be tested for chlamydia. Testing for other STIs is recommended if you are sexually active and at increased risk.  Osteoporosis is a disease in which the bones lose minerals and strength with aging. This can result in serious bone fractures. The risk of osteoporosis can be identified using a bone density scan. Women ages 65 and over and women at risk for fractures or osteoporosis should discuss screening with their caregivers. Ask your caregiver whether you should take a calcium supplement or vitamin D to reduce the rate of osteoporosis.  Menopause can be associated with physical symptoms and risks. Hormone replacement therapy is available to decrease symptoms and risks. You should talk to your caregiver about whether hormone replacement therapy is right for you.  Use sunscreen with sun protection factor (SPF) of 30 or more. Apply sunscreen liberally and repeatedly throughout the day. You should seek shade when your shadow is shorter than you. Protect yourself by wearing long sleeves, pants, a wide-brimmed hat, and sunglasses year round, whenever you are outdoors.  Once a month, do a whole body skin exam, using a mirror to look at the skin on your back. Notify your caregiver of new moles, moles that have irregular borders, moles that are larger than a pencil eraser, or moles that have changed in shape or color.  Stay current with required immunizations.  Influenza. You need a dose every fall (or winter). The composition of the flu vaccine  changes each year, so being vaccinated once is not enough.  Pneumococcal polysaccharide. You need 1 to 2 doses if you smoke cigarettes or if you have certain chronic medical conditions. You need 1 dose at age 65 (or older) if you have never been vaccinated.  Tetanus, diphtheria, pertussis (Tdap, Td). Get 1 dose of Tdap vaccine if you are younger than age 65, are over 65 and have contact with an infant, are a healthcare worker, are pregnant, or simply want to be protected from whooping cough. After that, you need a Td   booster dose every 10 years. Consult your caregiver if you have not had at least 3 tetanus and diphtheria-containing shots sometime in your life or have a deep or dirty wound.  HPV. You need this vaccine if you are a woman age 26 or younger. The vaccine is given in 3 doses over 6 months.  Measles, mumps, rubella (MMR). You need at least 1 dose of MMR if you were born in 1957 or later. You may also need a second dose.  Meningococcal. If you are age 19 to 21 and a first-year college student living in a residence hall, or have one of several medical conditions, you need to get vaccinated against meningococcal disease. You may also need additional booster doses.  Zoster (shingles). If you are age 60 or older, you should get this vaccine.  Varicella (chickenpox). If you have never had chickenpox or you were vaccinated but received only 1 dose, talk to your caregiver to find out if you need this vaccine.  Hepatitis A. You need this vaccine if you have a specific risk factor for hepatitis A virus infection or you simply wish to be protected from this disease. The vaccine is usually given as 2 doses, 6 to 18 months apart.  Hepatitis B. You need this vaccine if you have a specific risk factor for hepatitis B virus infection or you simply wish to be protected from this disease. The vaccine is given in 3 doses, usually over 6 months. Preventive Services / Frequency Ages 19 to 39  Blood  pressure check.** / Every 1 to 2 years.  Lipid and cholesterol check.** / Every 5 years beginning at age 20.  Clinical breast exam.** / Every 3 years for women in their 20s and 30s.  Pap test.** / Every 2 years from ages 21 through 29. Every 3 years starting at age 30 through age 65 or 70 with a history of 3 consecutive normal Pap tests.  HPV screening.** / Every 3 years from ages 30 through ages 65 to 70 with a history of 3 consecutive normal Pap tests.  Hepatitis C blood test.** / For any individual with known risks for hepatitis C.  Skin self-exam. / Monthly.  Influenza immunization.** / Every year.  Pneumococcal polysaccharide immunization.** / 1 to 2 doses if you smoke cigarettes or if you have certain chronic medical conditions.  Tetanus, diphtheria, pertussis (Tdap, Td) immunization. / A one-time dose of Tdap vaccine. After that, you need a Td booster dose every 10 years.  HPV immunization. / 3 doses over 6 months, if you are 26 and younger.  Measles, mumps, rubella (MMR) immunization. / You need at least 1 dose of MMR if you were born in 1957 or later. You may also need a second dose.  Meningococcal immunization. / 1 dose if you are age 19 to 21 and a first-year college student living in a residence hall, or have one of several medical conditions, you need to get vaccinated against meningococcal disease. You may also need additional booster doses.  Varicella immunization.** / Consult your caregiver.  Hepatitis A immunization.** / Consult your caregiver. 2 doses, 6 to 18 months apart.  Hepatitis B immunization.** / Consult your caregiver. 3 doses usually over 6 months. ** Family history and personal history of risk and conditions may change your caregiver's recommendations. Document Released: 11/22/2001 Document Revised: 12/19/2011 Document Reviewed: 02/21/2011 Exit[employer] Patient Information 2013 [employer].  

## 2013-01-02 ENCOUNTER — Telehealth: Payer: Self-pay | Admitting: General Practice

## 2013-01-02 MED ORDER — FLUCONAZOLE 150 MG PO TABS: 150.0000 mg | ORAL_TABLET | Freq: Once | ORAL | Status: DC

## 2013-01-02 NOTE — Addendum Note (Signed)
Addended by: Catalina Antigua on: 01/02/2013 11:43 AM   Modules accepted: Orders

## 2013-01-02 NOTE — Telephone Encounter (Signed)
Message copied by Kathee Delton on Wed Jan 02, 2013 12:17 PM ------      Message from: CONSTANT, PEGGY      Created: Wed Jan 02, 2013 11:42 AM       Please inform patient of yeast infection. Rx e-prescribed            Peggy ------

## 2013-01-02 NOTE — Telephone Encounter (Signed)
Called patient and informed her of results & Rx. Patient verbalized understanding and had no further questions

## 2013-01-04 ENCOUNTER — Ambulatory Visit (HOSPITAL_COMMUNITY)
Admission: RE | Admit: 2013-01-04 | Discharge: 2013-01-04 | Disposition: A | Discharge status: Home / Self Care | Payer: Medicaid Other | Source: Ambulatory Visit | Attending: Obstetrics and Gynecology | Admitting: Obstetrics and Gynecology

## 2013-01-04 DIAGNOSIS — R102 Pelvic and perineal pain: Secondary | ICD-10-CM

## 2013-01-04 DIAGNOSIS — N838 Other noninflammatory disorders of ovary, fallopian tube and broad ligament: Secondary | ICD-10-CM | POA: Insufficient documentation

## 2013-01-04 DIAGNOSIS — R1032 Left lower quadrant pain: Secondary | ICD-10-CM | POA: Insufficient documentation

## 2013-01-07 ENCOUNTER — Telehealth: Payer: Self-pay

## 2013-01-07 ENCOUNTER — Other Ambulatory Visit: Payer: Self-pay | Admitting: Obstetrics and Gynecology

## 2013-01-07 DIAGNOSIS — N83209 Unspecified ovarian cyst, unspecified side: Secondary | ICD-10-CM

## 2013-01-07 NOTE — Progress Notes (Signed)
Dr. Jolayne Panther,  If you could please put in the ultrasound order that you prefer.  Thank you,  Gaylyn Rong

## 2013-01-07 NOTE — Telephone Encounter (Signed)
Patient called in for results of her U/S done on Friday.  Spoke with patient and scheduled a follow up U?S for 02/12/13

## 2013-01-09 ENCOUNTER — Telehealth: Payer: Self-pay

## 2013-01-09 NOTE — Telephone Encounter (Signed)
Message copied by Faythe Casa on Wed Jan 09, 2013 12:04 PM ------      Message from: CONSTANT, PEGGY      Created: Mon Jan 07, 2013 12:35 PM       Please inform patient of ultrasound report which did not demonstrate any abnormalities on the left side. It did show the presence of 2 small cysts on the right ovary. A follow-up ultrasound will be scheduled for her in 6-8 weeks to ensure that these cysts have resolved. ------

## 2013-01-09 NOTE — Telephone Encounter (Signed)
Routed to Dr. Jolayne Panther to put in which [place] she wants scheduled.

## 2013-01-09 NOTE — Telephone Encounter (Signed)
Pt was notified of [place] results on 3/31 and need for fu [place] on 5/6 as scheduled.

## 2013-02-06 ENCOUNTER — Other Ambulatory Visit: Payer: Self-pay | Admitting: Cardiology

## 2013-02-07 ENCOUNTER — Other Ambulatory Visit: Payer: Self-pay | Admitting: Emergency Medicine

## 2013-02-07 MED ORDER — VERAPAMIL HCL ER 180 MG PO TBCR: 180.0000 mg | EXTENDED_RELEASE_TABLET | Freq: Two times a day (BID) | ORAL | Status: DC

## 2013-02-08 NOTE — Telephone Encounter (Signed)
rx sent to pharmacy by e-script To last until next scheduled apt per noted pt has no showed last few apts

## 2013-02-11 ENCOUNTER — Other Ambulatory Visit: Payer: Self-pay | Admitting: *Deleted

## 2013-02-11 DIAGNOSIS — N83201 Unspecified ovarian cyst, right side: Secondary | ICD-10-CM

## 2013-02-11 DIAGNOSIS — N83209 Unspecified ovarian cyst, unspecified side: Secondary | ICD-10-CM

## 2013-02-12 ENCOUNTER — Ambulatory Visit (HOSPITAL_COMMUNITY): Payer: Medicaid Other

## 2013-02-13 ENCOUNTER — Ambulatory Visit (HOSPITAL_COMMUNITY)
Admission: RE | Admit: 2013-02-13 | Discharge: 2013-02-13 | Disposition: A | Discharge status: Home / Self Care | Payer: Medicaid Other | Source: Ambulatory Visit | Attending: Obstetrics and Gynecology | Admitting: Obstetrics and Gynecology

## 2013-02-13 DIAGNOSIS — N83209 Unspecified ovarian cyst, unspecified side: Secondary | ICD-10-CM | POA: Insufficient documentation

## 2013-02-13 DIAGNOSIS — N83201 Unspecified ovarian cyst, right side: Secondary | ICD-10-CM

## 2013-02-14 ENCOUNTER — Telehealth: Payer: Self-pay | Admitting: *Deleted

## 2013-02-14 NOTE — Telephone Encounter (Signed)
Called Justis and notified her of normal new ovarian cyst that will resolve spontaneously and no need for follow up. Patient voices relief and understanding.

## 2013-02-14 NOTE — Telephone Encounter (Signed)
Message copied by Gerome Apley on Thu Feb 14, 2013  3:59 PM ------      Message from: CONSTANT, PEGGY      Created: Thu Feb 14, 2013 11:57 AM       Please inform patient of normal new ovarian cyst on right ovary which will resolve spontaneously. No need for further follow-up            Peggy ------

## 2013-04-08 ENCOUNTER — Telehealth: Payer: Self-pay | Admitting: Obstetrics and Gynecology

## 2013-04-08 DIAGNOSIS — B379 Candidiasis, unspecified: Secondary | ICD-10-CM

## 2013-04-08 MED ORDER — FLUCONAZOLE 150 MG PO TABS: 150.0000 mg | ORAL_TABLET | Freq: Once | ORAL | Status: DC

## 2013-04-08 NOTE — Telephone Encounter (Signed)
Patient called with c/o itchiness and yellow discharge. States she was recently prescribed antibiotic from her dentist. Per protocol, diflucan was ordered and sent. Patient satisfied.

## 2013-04-17 ENCOUNTER — Encounter: Payer: Self-pay | Admitting: *Deleted

## 2013-04-19 ENCOUNTER — Inpatient Hospital Stay (HOSPITAL_COMMUNITY)
Admission: AD | Admit: 2013-04-19 | Discharge: 2013-04-19 | Disposition: A | Discharge status: Home / Self Care | Payer: Medicaid Other | Source: Ambulatory Visit | Attending: Obstetrics & Gynecology | Admitting: Obstetrics & Gynecology

## 2013-04-19 ENCOUNTER — Encounter (HOSPITAL_COMMUNITY): Payer: Self-pay | Admitting: *Deleted

## 2013-04-19 DIAGNOSIS — A5900 Urogenital trichomoniasis, unspecified: Secondary | ICD-10-CM

## 2013-04-19 DIAGNOSIS — R109 Unspecified abdominal pain: Secondary | ICD-10-CM | POA: Insufficient documentation

## 2013-04-19 DIAGNOSIS — A5901 Trichomonal vulvovaginitis: Secondary | ICD-10-CM | POA: Insufficient documentation

## 2013-04-19 DIAGNOSIS — N949 Unspecified condition associated with female genital organs and menstrual cycle: Secondary | ICD-10-CM | POA: Insufficient documentation

## 2013-04-19 HISTORY — DX: Gestational (pregnancy-induced) hypertension without significant proteinuria, unspecified trimester: O13.9

## 2013-04-19 LAB — URINE MICROSCOPIC-ADD ON

## 2013-04-19 LAB — WET PREP, GENITAL: Yeast Wet Prep HPF POC: NONE SEEN

## 2013-04-19 LAB — URINALYSIS, ROUTINE W REFLEX MICROSCOPIC
Glucose, UA: NEGATIVE mg/dL
pH: 6 (ref 5.0–8.0)

## 2013-04-19 MED ORDER — METRONIDAZOLE 500 MG PO TABS
2000.0000 mg | ORAL_TABLET | Freq: Once | ORAL | Status: AC
  Administered 2013-04-19: 2000 mg via ORAL
  Filled 2013-04-19: qty 4

## 2013-04-19 NOTE — MAU Provider Note (Signed)
History     CSN: [id number]  Arrival date and time: 04/19/13 6213   First Provider Initiated Contact with Patient 04/19/13 0941      Chief Complaint  Patient presents with  . Vaginal Discharge   HPI 37 y.o. [id number] with low abd "discomfort" and vaginal discharge. Recently treated for yeast after taking abx for tooth infection, discharge has continued. Concerned that her partner has "stepped out" on her.   Past Medical History  Diagnosis Date  . Hypertension   . Fibroid   . Anemia   . Abnormal Pap smear   . Hypopotassemia   . Esophageal reflux   . Palpitations   . Pregnancy induced hypertension   . Dysrhythmia     pvc's  . Asthma     as child    Past Surgical History  Procedure Laterality Date  . Tubal ligation    . Colposcopy    . Oophorectomy      Left    Family History  Problem Relation Age of Onset  . Hypertension Father   . Kidney disease Father   . Cancer Father   . Diabetes Maternal Grandmother   . Hypertension Paternal Grandmother   . Diabetes Father     History  Substance Use Topics  . Smoking status: Never Smoker   . Smokeless tobacco: Never Used  . Alcohol Use: No    Allergies:  Allergies  Allergen Reactions  . Sulfa Antibiotics Rash and Other (See Comments)    "Burning"    No prescriptions prior to admission    Review of Systems  Constitutional: Negative.   Respiratory: Negative.   Cardiovascular: Negative.   Gastrointestinal: Positive for abdominal pain. Negative for nausea, vomiting, diarrhea and constipation.  Genitourinary: Negative for dysuria, urgency, frequency, hematuria and flank pain.       Negative for vaginal bleeding, + discharge   Musculoskeletal: Negative.   Neurological: Negative.   Psychiatric/Behavioral: Negative.    Physical Exam   Blood pressure 150/74, pulse 82, temperature 97.8 F (36.6 C), temperature source Oral, resp. rate 18, height 5\' 9"  (1.753 m), weight 279 lb (126.554 kg), last menstrual period  03/26/2013.  Physical Exam  Nursing note and vitals reviewed. Constitutional: She is oriented to person, place, and time. She appears well-developed and well-nourished. No distress.  Cardiovascular: Normal rate.   Respiratory: Effort normal.  GI: Soft. There is no tenderness.  Genitourinary: Uterus is not enlarged and not tender. Right adnexum displays no mass, no tenderness and no fullness. Left adnexum displays no mass, no tenderness and no fullness. Vaginal discharge (thin, white, malodorous) found.  Musculoskeletal: Normal range of motion.  Neurological: She is alert and oriented to person, place, and time.  Skin: Skin is warm and dry.  Psychiatric: She has a normal mood and affect.    MAU Course  Procedures  Results for orders placed during the hospital encounter of 04/19/13 (from the past 24 hour(s))  URINALYSIS, ROUTINE W REFLEX MICROSCOPIC     Status: Abnormal   Collection Time    04/19/13  9:20 AM      Result Value Range   Color, Urine YELLOW  YELLOW   APPearance CLOUDY (*) CLEAR   Specific Gravity, Urine >1.030 (*) 1.005 - 1.030   pH 6.0  5.0 - 8.0   Glucose, UA NEGATIVE  NEGATIVE mg/dL   Hgb urine dipstick TRACE (*) NEGATIVE   Bilirubin Urine NEGATIVE  NEGATIVE   Ketones, ur NEGATIVE  NEGATIVE mg/dL   Protein, ur  NEGATIVE  NEGATIVE mg/dL   Urobilinogen, UA 0.2  0.0 - 1.0 mg/dL   Nitrite NEGATIVE  NEGATIVE   Leukocytes, UA SMALL (*) NEGATIVE  URINE MICROSCOPIC-ADD ON     Status: Abnormal   Collection Time    04/19/13  9:20 AM      Result Value Range   Squamous Epithelial / LPF MANY (*) RARE   WBC, UA 3-6  <3 WBC/hpf   RBC / HPF 0-2  <3 RBC/hpf   Bacteria, UA FEW (*) RARE   Urine-Other TRICHOMONAS PRESENT    POCT PREGNANCY, URINE     Status: None   Collection Time    04/19/13  9:37 AM      Result Value Range   Preg Test, Ur NEGATIVE  NEGATIVE  WET PREP, GENITAL     Status: Abnormal   Collection Time    04/19/13  9:40 AM      Result Value Range   Yeast Wet  Prep HPF POC NONE SEEN  NONE SEEN   Trich, Wet Prep MANY (*) NONE SEEN   Clue Cells Wet Prep HPF POC FEW (*) NONE SEEN   WBC, Wet Prep HPF POC FEW (*) NONE SEEN    Assessment and Plan   1. Trichomoniasis, urogenital   Flagyl 2000 mg PO x 1 in MAU Rev'd partner treatment    Medication List         ibuprofen 200 MG tablet  Commonly known as:  ADVIL,MOTRIN  Take 400 mg by mouth every 6 (six) hours as needed. Pain     potassium chloride SA 20 MEQ tablet  Commonly known as:  K-DUR,KLOR-CON  Take 20 mEq by mouth 2 (two) times daily.     spironolactone 50 MG tablet  Commonly known as:  ALDACTONE  Take 50 mg by mouth daily.     verapamil 180 MG CR tablet  Commonly known as:  CALAN-SR  TAKE 2 TABLETS BY MOUTH ONCE A DAY        Follow-up Information   Follow up with [facility] (As needed)    Contact information:   [address] [place] [address] [phone]        [facility] 04/19/2013, 10:48 AM

## 2013-04-19 NOTE — MAU Note (Signed)
Was on an antibiotic for infected tooth. Now has ? Yeast infection.  Also fiance had stepped out and she is afraid she has something.

## 2013-04-19 NOTE — MAU Provider Note (Signed)
Attestation of Attending Supervision of Advanced Practitioner (PA/CNM/NP): Evaluation and management procedures were performed by the Advanced Practitioner under my supervision and collaboration.  I have reviewed the Advanced Practitioner's note and chart, and I agree with the management and plan.  Nevaan Bunton, MD, FACOG Attending Obstetrician & Gynecologist Faculty Practice, [facility]  

## 2013-04-20 LAB — URINE CULTURE

## 2013-04-24 ENCOUNTER — Ambulatory Visit: Payer: Medicaid Other | Admitting: Cardiology

## 2013-05-10 ENCOUNTER — Other Ambulatory Visit: Payer: Self-pay | Admitting: *Deleted

## 2013-05-10 MED ORDER — POTASSIUM CHLORIDE CRYS ER 20 MEQ PO TBCR: 20.0000 meq | EXTENDED_RELEASE_TABLET | Freq: Two times a day (BID) | ORAL | Status: DC

## 2013-05-10 NOTE — Telephone Encounter (Signed)
Patient calling to have Potassium refilled. Patient has not been seen since feb 2013. I let her know I can only refill her medication for 30 days and after that she must make and attend a future appointment to receive any more refills. She verbally agreed and said she will call back at a later time.    Micki Riley, CMA

## 2013-05-13 ENCOUNTER — Other Ambulatory Visit: Payer: Self-pay | Admitting: Cardiology

## 2013-05-20 ENCOUNTER — Encounter: Payer: Self-pay | Admitting: Cardiology

## 2013-05-23 ENCOUNTER — Telehealth: Payer: Self-pay

## 2013-05-23 MED ORDER — SPIRONOLACTONE 50 MG PO TABS: 50.0000 mg | ORAL_TABLET | Freq: Every day | ORAL | Status: DC

## 2013-05-23 NOTE — Telephone Encounter (Signed)
pt called to rqst refill for spironactone 50mg . refill 30 R-0. pt has appt 06-18-13 w/Lori G.

## 2013-06-13 ENCOUNTER — Encounter: Payer: Self-pay | Admitting: Cardiology

## 2013-06-18 ENCOUNTER — Ambulatory Visit (INDEPENDENT_AMBULATORY_CARE_PROVIDER_SITE_OTHER): Payer: Medicaid Other | Admitting: Nurse Practitioner

## 2013-06-18 ENCOUNTER — Encounter: Payer: Self-pay | Admitting: Nurse Practitioner

## 2013-06-18 VITALS — BP 150/90 | HR 92 | Ht 69.5 in | Wt 290.0 lb

## 2013-06-18 DIAGNOSIS — I10 Essential (primary) hypertension: Secondary | ICD-10-CM

## 2013-06-18 LAB — CBC WITH DIFFERENTIAL/PLATELET
Basophils Absolute: 0 10*3/uL (ref 0.0–0.1)
Basophils Relative: 0.4 % (ref 0.0–3.0)
Eosinophils Absolute: 0.1 10*3/uL (ref 0.0–0.7)
Eosinophils Relative: 0.9 % (ref 0.0–5.0)
HCT: 35.9 % — ABNORMAL LOW (ref 36.0–46.0)
Hemoglobin: 11.7 g/dL — ABNORMAL LOW (ref 12.0–15.0)
Lymphocytes Relative: 25.5 % (ref 12.0–46.0)
Lymphs Abs: 2.5 10*3/uL (ref 0.7–4.0)
MCHC: 32.7 g/dL (ref 30.0–36.0)
MCV: 85.8 fl (ref 78.0–100.0)
Monocytes Absolute: 0.6 10*3/uL (ref 0.1–1.0)
Monocytes Relative: 5.6 % (ref 3.0–12.0)
Neutro Abs: 6.7 10*3/uL (ref 1.4–7.7)
Neutrophils Relative %: 67.6 % (ref 43.0–77.0)
Platelets: 279 10*3/uL (ref 150.0–400.0)
RBC: 4.19 Mil/uL (ref 3.87–5.11)
RDW: 14.9 % — ABNORMAL HIGH (ref 11.5–14.6)
WBC: 10 10*3/uL (ref 4.5–10.5)

## 2013-06-18 LAB — LIPID PANEL
Cholesterol: 183 mg/dL (ref 0–200)
HDL: 42.6 mg/dL (ref >39.00)
LDL Cholesterol: 125 mg/dL — ABNORMAL HIGH (ref 0–99)
Total CHOL/HDL Ratio: 4
Triglycerides: 75 mg/dL (ref 0.0–149.0)
VLDL: 15 mg/dL (ref 0.0–40.0)

## 2013-06-18 LAB — BASIC METABOLIC PANEL
BUN: 10 mg/dL (ref 6–23)
CO2: 26 mEq/L (ref 19–32)
Calcium: 8.8 mg/dL (ref 8.4–10.5)
Chloride: 104 mEq/L (ref 96–112)
Creatinine, Ser: 0.7 mg/dL (ref 0.4–1.2)
GFR: 126.2 mL/min (ref >60.00)
Glucose, Bld: 79 mg/dL (ref 70–99)
Potassium: 4 mEq/L (ref 3.5–5.1)
Sodium: 136 mEq/L (ref 135–145)

## 2013-06-18 LAB — HEPATIC FUNCTION PANEL
ALT: 15 U/L (ref 0–35)
AST: 15 U/L (ref 0–37)
Albumin: 3.4 g/dL — ABNORMAL LOW (ref 3.5–5.2)
Alkaline Phosphatase: 50 U/L (ref 39–117)
Bilirubin, Direct: 0 mg/dL (ref 0.0–0.3)
Total Bilirubin: 0.5 mg/dL (ref 0.3–1.2)
Total Protein: 7.7 g/dL (ref 6.0–8.3)

## 2013-06-18 MED ORDER — POTASSIUM CHLORIDE CRYS ER 20 MEQ PO TBCR: 20.0000 meq | EXTENDED_RELEASE_TABLET | Freq: Two times a day (BID) | ORAL | Status: DC

## 2013-06-18 MED ORDER — VERAPAMIL HCL ER 180 MG PO TBCR: EXTENDED_RELEASE_TABLET | ORAL | Status: DC

## 2013-06-18 MED ORDER — SPIRONOLACTONE 50 MG PO TABS: 50.0000 mg | ORAL_TABLET | Freq: Every day | ORAL | Status: DC

## 2013-06-18 NOTE — Progress Notes (Signed)
Tammie [name] Date of Birth: 1976/01/06 Medical Record #[id number]  History of Present Illness: [name] is seen back today for a follow up visit. Former patient of Dr. Vern Claude. Has HTN, morbid obesity and PVCs.   Last seen here back in February of 2013 - risk factor modification encouraged.   Comes in today. Here alone. Needs her medicines refilled. Doing ok. Father has since passed with throat cancer - he was 6. She is doing ok. Weight has climbed. Not exercising but [facility] [employer] and would like a note that would support her case for consideration of a scholarship. She is wanting to start exercising and working on her weight. BP at home is much better - usually no higher than 130/80. No chest pain or shortness of breath. Not dizzy or lightheaded. Feels ok on her medicines.   Current Outpatient Prescriptions  Medication Sig Dispense Refill  . ibuprofen (ADVIL,MOTRIN) 200 MG tablet Take 400 mg by mouth every 6 (six) hours as needed. Pain      . potassium chloride SA (K-DUR,KLOR-CON) 20 MEQ tablet Take 1 tablet (20 mEq total) by mouth 2 (two) times daily.  60 tablet  0  . spironolactone (ALDACTONE) 50 MG tablet Take 1 tablet (50 mg total) by mouth daily.  30 tablet  0  . verapamil (CALAN-SR) 180 MG CR tablet TAKE 2 TABLETS BY MOUTH ONCE A DAY  60 tablet  1   No current facility-administered medications for this visit.    Allergies  Allergen Reactions  . Sulfa Antibiotics Rash and Other (See Comments)    "Burning"    Past Medical History  Diagnosis Date  . Hypertension   . Fibroid   . Anemia   . Abnormal Pap smear   . Hypopotassemia   . Esophageal reflux   . Palpitations   . Pregnancy induced hypertension   . Dysrhythmia     pvc's  . Asthma     as child  . Obesity     Past Surgical History  Procedure Laterality Date  . Tubal ligation    . Colposcopy    . Oophorectomy      Left    History  Smoking status  . Never Smoker   Smokeless tobacco  .  Never Used    History  Alcohol Use No    Family History  Problem Relation Age of Onset  . Hypertension Father   . Kidney disease Father   . Cancer Father   . Diabetes Maternal Grandmother   . Hypertension Paternal Grandmother   . Diabetes Father     Review of Systems: The review of systems is per the HPI.  All other systems were reviewed and are negative.  Physical Exam: BP 150/90  Pulse 92  Ht 5' 9.5" (1.765 m)  Wt 290 lb (131.543 kg)  BMI 42.23 kg/m2 Patient is very pleasant and in no acute distress. She is morbidly obese. Weight is up 14 pounds. Skin is warm and dry. Color is normal.  HEENT is unremarkable. Normocephalic/atraumatic. PERRL. Sclera are nonicteric. Neck is supple. No masses. No JVD. Lungs are clear. Cardiac exam shows a regular rate and rhythm. Abdomen is soft. Extremities are without edema. Gait and ROM are intact. No gross neurologic deficits noted.  LABORATORY DATA:  Lab Results  Component Value Date   WBC 14.0* 07/24/2012   HGB 11.8* 07/24/2012   HCT 36.8 07/24/2012   PLT 379 07/24/2012   GLUCOSE 113* 07/24/2012   CHOL  170 11/13/2008   TRIG 46 11/13/2008   HDL 41.1 11/13/2008   LDLCALC 120* 11/13/2008   ALT 12 04/17/2012   AST 16 04/17/2012   NA 135 07/24/2012   K 4.0 07/24/2012   CL 100 07/24/2012   CREATININE 0.69 07/24/2012   BUN 8 07/24/2012   CO2 24 07/24/2012   TSH 1.218 12/20/2010   HGBA1C 4.7 11/16/2011     Assessment / Plan: 1. HTN - has better blood pressure control at home. I have refilled her medicines today. Needs to work on CV risk factor modification and this was discussed in depth today.   2. Morbid obesity - discussed in depth today. Will recheck her labs today.   3. Palpitations/PVCs - no current issue.   I will see her back in a year. I have given her a note to support her case for the scholarship thru the [employer]. Exercise, weight loss would greatly impact her long term prognosis.   Patient is agreeable to this plan and will call if  any problems develop in the interim.   Rosalio Macadamia, RN, ANP-C [facility] [address] [place], [place]  [address]

## 2013-06-18 NOTE — Patient Instructions (Addendum)
Continue on your current medicines  I will see you back in a year  Let's get your labs checked today  Think about what we talked about today.  Call the [facility] office at [phone] if you have any questions, problems or concerns.

## 2013-07-01 ENCOUNTER — Telehealth: Payer: Self-pay | Admitting: General Practice

## 2013-07-01 DIAGNOSIS — B373 Candidiasis of vulva and vagina: Secondary | ICD-10-CM

## 2013-07-01 NOTE — Telephone Encounter (Signed)
Patient called and left message stating she would like to make an appt with [place]. She was told we were booked till November and she would like a call back as soon as possible

## 2013-07-02 MED ORDER — FLUCONAZOLE 150 MG PO TABS: 150.0000 mg | ORAL_TABLET | Freq: Once | ORAL | Status: DC

## 2013-07-02 NOTE — Telephone Encounter (Signed)
Pt states that she has been on abx and now has a yeast infection. I told her I would send in Diflucan per protocol to her pharmacy. Pt agrees.

## 2013-07-05 ENCOUNTER — Telehealth: Payer: Self-pay | Admitting: *Deleted

## 2013-07-05 NOTE — Telephone Encounter (Signed)
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 Wants to know if she has a reversal if she and her partner could still have a child.  [employer] and we discussed having a tubal then a reversal reduces her chances and also having only one ovary and tube left reduces her chances significantly.  Informed her that I cannot give her stats but if she wants more information she should pursue that with an infertility specialist. Offered her phone numbers and she requested and was given number for [facility] medicine.

## 2013-07-18 ENCOUNTER — Telehealth: Payer: Self-pay | Admitting: Nurse Practitioner

## 2013-07-18 NOTE — Telephone Encounter (Signed)
New message  Did we ck her kidney function ago when she had lab work? And--Want copy of [employer] exercise program recommendation Lawson Fiscal wrote 2 months ago.

## 2013-07-18 NOTE — Telephone Encounter (Signed)
Pt aware of normal kidney function on lab report from 9/9.  She is aware there is no copy of the note that Lawson Fiscal did for her at the time of her last appointment and I will forward this to her to be redone as I do not know what all it said.  She states understanding.

## 2013-07-19 ENCOUNTER — Telehealth: Payer: Self-pay | Admitting: *Deleted

## 2013-07-19 ENCOUNTER — Encounter: Payer: Self-pay | Admitting: *Deleted

## 2013-07-19 DIAGNOSIS — B9689 Other specified bacterial agents as the cause of diseases classified elsewhere: Secondary | ICD-10-CM

## 2013-07-19 DIAGNOSIS — N898 Other specified noninflammatory disorders of vagina: Secondary | ICD-10-CM

## 2013-07-19 MED ORDER — METRONIDAZOLE 500 MG PO TABS: 500.0000 mg | ORAL_TABLET | Freq: Two times a day (BID) | ORAL | Status: DC

## 2013-07-19 NOTE — Telephone Encounter (Signed)
Tammie [name] called and states she is trying to get an appointment , but we are booked until November- I think I have BV- I've had it before- same smell- can you call in something?  [employer] and verified she is having vaginal odor same as when she was told she had BV- informed her I would send in flagyl to pharmacy and if this does not help she must be seen in clinic to evaluate if she has another infection. Suggested she make appointment and then cancel if not needed.  She voices understanding.

## 2013-07-19 NOTE — Telephone Encounter (Signed)
S/w pt stated handed paperwork to [facility] and requested a letter, pt stated handed in a letter, I wrote a new letter today which is now in pt's chart and will mail the letter to the pt. Pt was agreement with plan

## 2013-07-19 NOTE — Telephone Encounter (Signed)
This was on a handwritten script - did she lose it?

## 2013-09-08 ENCOUNTER — Emergency Department (HOSPITAL_COMMUNITY): Payer: Medicaid Other

## 2013-09-08 ENCOUNTER — Emergency Department (HOSPITAL_COMMUNITY)
Admission: EM | Admit: 2013-09-08 | Discharge: 2013-09-08 | Disposition: A | Discharge status: Home / Self Care | Payer: Medicaid Other | Attending: Emergency Medicine | Admitting: Emergency Medicine

## 2013-09-08 ENCOUNTER — Encounter (HOSPITAL_COMMUNITY): Payer: Self-pay | Admitting: Emergency Medicine

## 2013-09-08 DIAGNOSIS — Z8742 Personal history of other diseases of the female genital tract: Secondary | ICD-10-CM | POA: Insufficient documentation

## 2013-09-08 DIAGNOSIS — Z8719 Personal history of other diseases of the digestive system: Secondary | ICD-10-CM | POA: Insufficient documentation

## 2013-09-08 DIAGNOSIS — S63502A Unspecified sprain of left wrist, initial encounter: Secondary | ICD-10-CM

## 2013-09-08 DIAGNOSIS — Z79899 Other long term (current) drug therapy: Secondary | ICD-10-CM | POA: Insufficient documentation

## 2013-09-08 DIAGNOSIS — I10 Essential (primary) hypertension: Secondary | ICD-10-CM | POA: Insufficient documentation

## 2013-09-08 DIAGNOSIS — Z862 Personal history of diseases of the blood and blood-forming organs and certain disorders involving the immune mechanism: Secondary | ICD-10-CM | POA: Insufficient documentation

## 2013-09-08 DIAGNOSIS — J45909 Unspecified asthma, uncomplicated: Secondary | ICD-10-CM | POA: Insufficient documentation

## 2013-09-08 DIAGNOSIS — Y939 Activity, unspecified: Secondary | ICD-10-CM | POA: Insufficient documentation

## 2013-09-08 DIAGNOSIS — Y929 Unspecified place or not applicable: Secondary | ICD-10-CM | POA: Insufficient documentation

## 2013-09-08 DIAGNOSIS — X58XXXA Exposure to other specified factors, initial encounter: Secondary | ICD-10-CM | POA: Insufficient documentation

## 2013-09-08 DIAGNOSIS — E669 Obesity, unspecified: Secondary | ICD-10-CM | POA: Insufficient documentation

## 2013-09-08 DIAGNOSIS — S63509A Unspecified sprain of unspecified wrist, initial encounter: Secondary | ICD-10-CM | POA: Insufficient documentation

## 2013-09-08 MED ORDER — OXYCODONE-ACETAMINOPHEN 5-325 MG PO TABS
1.0000 | ORAL_TABLET | Freq: Once | ORAL | Status: AC
  Administered 2013-09-08: 1 via ORAL
  Filled 2013-09-08: qty 1

## 2013-09-08 MED ORDER — OXYCODONE-ACETAMINOPHEN 5-325 MG PO TABS: 1.0000 | ORAL_TABLET | Freq: Four times a day (QID) | ORAL | Status: DC | PRN

## 2013-09-08 NOTE — ED Notes (Signed)
Pt. 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

## 2013-09-08 NOTE — ED Provider Notes (Signed)
CSN: [id number]     Arrival date & time 09/08/13  0100 History   First MD Initiated Contact with Patient 09/08/13 0124     Chief Complaint  Patient presents with  . Wrist Pain   (Consider location/radiation/quality/duration/timing/severity/associated sxs/prior Treatment) Patient is a 37 y.o. female presenting with wrist pain. The history is provided by the patient.  Wrist Pain This is a new problem. Episode onset: 1 week ago. The problem occurs constantly. The problem has been gradually worsening. Pertinent negatives include no chest pain, no abdominal pain, no headaches and no shortness of breath. The symptoms are aggravated by bending. Nothing relieves the symptoms. She has tried nothing for the symptoms. The treatment provided mild relief.    Past Medical History  Diagnosis Date  . Hypertension   . Fibroid   . Anemia   . Abnormal Pap smear   . Hypopotassemia   . Esophageal reflux   . Palpitations   . Pregnancy induced hypertension   . Dysrhythmia     pvc's  . Asthma     as child  . Obesity    Past Surgical History  Procedure Laterality Date  . Tubal ligation    . Colposcopy    . Oophorectomy      Left   Family History  Problem Relation Age of Onset  . Hypertension Father   . Kidney disease Father   . Cancer Father   . Diabetes Maternal Grandmother   . Hypertension Paternal Grandmother   . Diabetes Father    History  Substance Use Topics  . Smoking status: Never Smoker   . Smokeless tobacco: Never Used  . Alcohol Use: No   OB History   Grav Para Term Preterm Abortions TAB SAB Ect Mult Living   2 2 2       2      Review of Systems  Constitutional: Negative for fever and fatigue.  HENT: Negative for congestion and drooling.   Eyes: Negative for pain.  Respiratory: Negative for cough and shortness of breath.   Cardiovascular: Negative for chest pain.  Gastrointestinal: Negative for nausea, vomiting, abdominal pain and diarrhea.  Genitourinary: Negative  for dysuria and hematuria.  Musculoskeletal: Negative for back pain, gait problem and neck pain.  Skin: Negative for color change.  Neurological: Negative for dizziness and headaches.  Hematological: Negative for adenopathy.  Psychiatric/Behavioral: Negative for behavioral problems.  All other systems reviewed and are negative.    Allergies  Sulfa antibiotics  Home Medications   Current Outpatient Rx  Name  Route  Sig  Dispense  Refill  . potassium chloride SA (K-DUR,KLOR-CON) 20 MEQ tablet   Oral   Take 1 tablet (20 mEq total) by mouth 2 (two) times daily.   180 tablet   3   . spironolactone (ALDACTONE) 50 MG tablet   Oral   Take 1 tablet (50 mg total) by mouth daily.   90 tablet   3   . verapamil (CALAN-SR) 180 MG CR tablet      TAKE 1 tablet two times a day   180 tablet   3   . ibuprofen (ADVIL,MOTRIN) 200 MG tablet   Oral   Take 400 mg by mouth every 6 (six) hours as needed. Pain          BP 144/77  Pulse 90  Temp(Src) 97.7 F (36.5 C) (Oral)  Resp 20  SpO2 98%  LMP 08/26/2013 Physical Exam  Nursing note and vitals reviewed. Constitutional: She is oriented  to person, place, and time. She appears well-developed and well-nourished.  HENT:  Head: Normocephalic.  Mouth/Throat: No oropharyngeal exudate.  Eyes: Conjunctivae and EOM are normal. Pupils are equal, round, and reactive to light.  Neck: Normal range of motion. Neck supple.  Cardiovascular: Normal rate, regular rhythm, normal heart sounds and intact distal pulses.  Exam reveals no gallop and no friction rub.   No murmur heard. Pulmonary/Chest: Effort normal and breath sounds normal. No respiratory distress. She has no wheezes.  Abdominal: Soft. Bowel sounds are normal. There is no tenderness. There is no rebound and no guarding.  Musculoskeletal: Normal range of motion.  Mild circumferential swelling to the left wrist. No erythema noted. No warmth noted.  Mild pain with ulnar and radial  deviation of the wrist.  2+ distal pulses.  Sensation and motor skills intact in the left wrist and hand.  Neurological: She is alert and oriented to person, place, and time.  Skin: Skin is warm and dry.  Psychiatric: She has a normal mood and affect. Her behavior is normal.    ED Course  Procedures (including critical care time) Labs Review Labs Reviewed - No data to display Imaging Review Dg Wrist Complete Left  09/08/2013   CLINICAL DATA:  Pain, swelling comment deformity hit left wrist, worsening over several weeks. No specific injury. Query repetitive motion at work.  EXAM: LEFT WRIST - COMPLETE 3+ VIEW  COMPARISON:  None.  FINDINGS: There is no evidence of fracture or dislocation. There is no evidence of arthropathy or other focal bone abnormality. Soft tissues are unremarkable.  IMPRESSION: Negative.   Electronically Signed   By: Burman Nieves M.D.   On: 09/08/2013 01:43    EKG Interpretation   None       MDM   1. Left wrist sprain, initial encounter    2:13 AM 37 y.o. female who presents with left wrist pain which began approximately one week ago. 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 She is afebrile and vital signs are unremarkable here. She denies fevers at home. She has mild swelling circumferentially of the wrist but no erythema. I suspect she has a mild wrist sprain. Imaging is noncontributory here. Will recommend rest, ice, NSAIDs, ace wrap for support.   2:13 AM:  I have discussed the diagnosis/risks/treatment options with the patient and believe the pt to be eligible for discharge home to follow-up with pcp as needed. We also discussed returning to the ED immediately if new or worsening sx occur. We discussed the sx which are most concerning (e.g., worsening pain, fever, redness, inc swelling) that necessitate immediate return. Any new prescriptions provided to the patient are listed  below.  Discharge Medication List as of 09/08/2013  2:16 AM    START taking these medications   Details  oxyCODONE-acetaminophen (PERCOCET) 5-325 MG per tablet Take 1 tablet by mouth every 6 (six) hours as needed for moderate pain., Starting 09/08/2013, Until Discontinued, Print           Junius Argyle, MD 09/09/13 [phone]

## 2013-09-27 ENCOUNTER — Telehealth: Payer: Self-pay | Admitting: *Deleted

## 2013-09-27 DIAGNOSIS — B373 Candidiasis of vulva and vagina: Secondary | ICD-10-CM

## 2013-09-27 MED ORDER — FLUCONAZOLE 150 MG PO TABS: 150.0000 mg | ORAL_TABLET | Freq: Once | ORAL | Status: DC

## 2013-09-27 NOTE — Telephone Encounter (Signed)
Pt left message stating that she is taking antibiotic for tooth infection. It always gives her a yeast infection and she is requesting Rx to be sent to [facility] on [address]   I called pt and left message on her personal voice mail that I have sent the Rx to her pharmacy. She should call us next week if her sx have not improved. Also, she may use hydrocortisone cream to the external genitalia for itching or irritation.

## 2013-11-08 ENCOUNTER — Encounter (HOSPITAL_COMMUNITY): Payer: Self-pay | Admitting: Emergency Medicine

## 2013-11-08 ENCOUNTER — Emergency Department (HOSPITAL_COMMUNITY)
Admission: EM | Admit: 2013-11-08 | Discharge: 2013-11-08 | Disposition: A | Discharge status: Home / Self Care | Payer: Medicaid Other | Source: Home / Self Care | Attending: Family Medicine | Admitting: Family Medicine

## 2013-11-08 DIAGNOSIS — S025XXA Fracture of tooth (traumatic), initial encounter for closed fracture: Secondary | ICD-10-CM

## 2013-11-08 MED ORDER — PENICILLIN V POTASSIUM 500 MG PO TABS: 500.0000 mg | ORAL_TABLET | Freq: Four times a day (QID) | ORAL | Status: DC

## 2013-11-08 MED ORDER — OXYCODONE-ACETAMINOPHEN 5-325 MG PO TABS: 1.0000 | ORAL_TABLET | ORAL | Status: DC | PRN

## 2013-11-08 MED ORDER — HYDROCODONE-ACETAMINOPHEN 5-325 MG PO TABS
2.0000 | ORAL_TABLET | Freq: Once | ORAL | Status: AC
  Administered 2013-11-08: 2 via ORAL

## 2013-11-08 MED ORDER — HYDROCODONE-ACETAMINOPHEN 5-325 MG PO TABS
ORAL_TABLET | ORAL | Status: AC
  Filled 2013-11-08: qty 2

## 2013-11-08 NOTE — ED Provider Notes (Signed)
Medical screening examination/treatment/procedure(s) were performed by resident physician or non-physician practitioner and as supervising physician I was immediately available for consultation/collaboration.   Pauline Good MD.   Billy Fischer, MD 11/08/13 2154

## 2013-11-08 NOTE — ED Notes (Signed)
States a filling came out about a week ago, and since then her tooth broke. C/o she has developed pain, swelling in her face since then

## 2013-11-08 NOTE — ED Provider Notes (Signed)
CSN: [id number]     Arrival date & time 11/08/13  1925 History   First MD Initiated Contact with Patient 11/08/13 2016     Chief Complaint  Patient presents with  . Dental Pain   (Consider location/radiation/quality/duration/timing/severity/associated sxs/prior Treatment) HPI  38 year old F with tooth pain. Right sided lower posterior molar. Start suddenly at work 2 days ago. Pt eating and noticed the pain and then noticed a filling came out. Pt have swelling of cheek. No fever or chills or drainage. No recent trauma otherwise. Pt has regular dentist but has not be able to take off work to go in for a visit.    Past Medical History  Diagnosis Date  . Hypertension   . Fibroid   . Anemia   . Abnormal Pap smear   . Hypopotassemia   . Esophageal reflux   . Palpitations   . Pregnancy induced hypertension   . Dysrhythmia     pvc's  . Asthma     as child  . Obesity    Past Surgical History  Procedure Laterality Date  . Tubal ligation    . Colposcopy    . Oophorectomy      Left   Family History  Problem Relation Age of Onset  . Hypertension Father   . Kidney disease Father   . Cancer Father   . Diabetes Maternal Grandmother   . Hypertension Paternal Grandmother   . Diabetes Father    History  Substance Use Topics  . Smoking status: Never Smoker   . Smokeless tobacco: Never Used  . Alcohol Use: No   OB History   Grav Para Term Preterm Abortions TAB SAB Ect Mult Living   2 2 2       2      Review of Systems See HPI Allergies  Sulfa antibiotics  Home Medications   Current Outpatient Rx  Name  Route  Sig  Dispense  Refill  . potassium chloride SA (K-DUR,KLOR-CON) 20 MEQ tablet   Oral   Take 1 tablet (20 mEq total) by mouth 2 (two) times daily.   180 tablet   3   . spironolactone (ALDACTONE) 50 MG tablet   Oral   Take 1 tablet (50 mg total) by mouth daily.   90 tablet   3   . verapamil (CALAN-SR) 180 MG CR tablet      TAKE 1 tablet two times a day  180 tablet   3   . fluconazole (DIFLUCAN) 150 MG tablet   Oral   Take 1 tablet (150 mg total) by mouth once.   1 tablet   0   . ibuprofen (ADVIL,MOTRIN) 200 MG tablet   Oral   Take 400 mg by mouth every 6 (six) hours as needed. Pain         . oxyCODONE-acetaminophen (ROXICET) 5-325 MG per tablet   Oral   Take 1-2 tablets by mouth every 4 (four) hours as needed for severe pain.   40 tablet   0   . penicillin v potassium (VEETID) 500 MG tablet   Oral   Take 1 tablet (500 mg total) by mouth 4 (four) times daily.   28 tablet   0    BP 151/100  Pulse 78  Temp(Src) 98.5 F (36.9 C) (Oral)  Resp 20  SpO2 98% Physical Exam Gen: obese female, very uncomfortable appearing, conversant Neck: normal ROM Jaw: tenderness of right TMJ no swelling Teeth: severe TTP or right posterior molars  with small fracture of posterior molar, no bleeding or drainage, no halitosis Gums: very tender but no swelling or drainage  ED Course  Procedures (including critical care time) Labs Review Labs Reviewed - No data to display Imaging Review No results found.    MDM   1. Tooth fracture    No evidence of abscess but will give prescription for antibiotics in case abscess develops. Will given percocet for pain med and recommend f/u with dentist.     Angelica Ran, MD 11/08/13 2049

## 2013-11-08 NOTE — Discharge Instructions (Signed)
Ms. Bartl,   I am sorry about your pain. I do not see a sign of abscess. Please use the pain medications and follow up with your dentist as needed. If you develop a fever or drainage, please start the penicillin.   Sincerely,   Dr. Maricela Bo

## 2014-02-28 ENCOUNTER — Encounter (HOSPITAL_COMMUNITY): Payer: Self-pay | Admitting: *Deleted

## 2014-02-28 ENCOUNTER — Inpatient Hospital Stay (HOSPITAL_COMMUNITY)
Admission: AD | Admit: 2014-02-28 | Discharge: 2014-02-28 | Disposition: A | Discharge status: Home / Self Care | Payer: Medicaid Other | Source: Ambulatory Visit | Attending: Obstetrics & Gynecology | Admitting: Obstetrics & Gynecology

## 2014-02-28 DIAGNOSIS — R109 Unspecified abdominal pain: Secondary | ICD-10-CM

## 2014-02-28 DIAGNOSIS — A499 Bacterial infection, unspecified: Secondary | ICD-10-CM | POA: Insufficient documentation

## 2014-02-28 DIAGNOSIS — B9689 Other specified bacterial agents as the cause of diseases classified elsewhere: Secondary | ICD-10-CM | POA: Insufficient documentation

## 2014-02-28 DIAGNOSIS — N760 Acute vaginitis: Secondary | ICD-10-CM | POA: Insufficient documentation

## 2014-02-28 DIAGNOSIS — I10 Essential (primary) hypertension: Secondary | ICD-10-CM | POA: Insufficient documentation

## 2014-02-28 LAB — URINALYSIS, ROUTINE W REFLEX MICROSCOPIC
Bilirubin Urine: NEGATIVE
Glucose, UA: NEGATIVE mg/dL
Hgb urine dipstick: NEGATIVE
Ketones, ur: NEGATIVE mg/dL
LEUKOCYTES UA: NEGATIVE
NITRITE: NEGATIVE
PH: 5.5 (ref 5.0–8.0)
Protein, ur: NEGATIVE mg/dL
Urobilinogen, UA: 0.2 mg/dL (ref 0.0–1.0)

## 2014-02-28 LAB — CBC
HEMATOCRIT: 31.7 % — AB (ref 36.0–46.0)
HEMOGLOBIN: 9.9 g/dL — AB (ref 12.0–15.0)
MCH: 26.6 pg (ref 26.0–34.0)
MCHC: 31.2 g/dL (ref 30.0–36.0)
MCV: 85.2 fL (ref 78.0–100.0)
Platelets: 296 10*3/uL (ref 150–400)
RBC: 3.72 MIL/uL — ABNORMAL LOW (ref 3.87–5.11)
RDW: 14 % (ref 11.5–15.5)
WBC: 12.2 10*3/uL — ABNORMAL HIGH (ref 4.0–10.5)

## 2014-02-28 LAB — WET PREP, GENITAL
TRICH WET PREP: NONE SEEN
YEAST WET PREP: NONE SEEN

## 2014-02-28 LAB — POCT PREGNANCY, URINE: Preg Test, Ur: NEGATIVE

## 2014-02-28 MED ORDER — IBUPROFEN 600 MG PO TABS: 600.0000 mg | ORAL_TABLET | Freq: Once | ORAL | Status: DC

## 2014-02-28 MED ORDER — METRONIDAZOLE 500 MG PO TABS: 500.0000 mg | ORAL_TABLET | Freq: Two times a day (BID) | ORAL | Status: DC

## 2014-02-28 NOTE — MAU Provider Note (Signed)
CC: Abdominal Pain    First Provider Initiated Contact with Patient 02/28/14 1949      HPI Tammie [name] is a 38 y.o. [id number] who presents with 1-2 wk hx of dull constant lower abdominal pain, radiating to her low back. Took ibuprofen 600 mg today with some relief. Pain was more pronounced with her menses and more noticeable with prolonged standing. Works in dialysis unit standing all day.Thought this might represent a UTI however she's never had a UTI. She denies dysuria, frequency of urination, urgency, or hematuria. Had myomectomy 2 years ago and had no other fibroids and at that time. LMP 02/24/2014. Denies abnormal bleeding  Past Medical History  Diagnosis Date  . Hypertension   . Fibroid   . Anemia   . Abnormal Pap smear   . Hypopotassemia   . Esophageal reflux   . Palpitations   . Pregnancy induced hypertension   . Dysrhythmia     pvc's  . Asthma     as child  . Obesity   Has degenerative disk dz   OB History  Gravida Para Term Preterm AB SAB TAB Ectopic Multiple Living  2 2 2       2     # Outcome Date GA Lbr Len/2nd Weight Sex Delivery Anes PTL Lv  2 TRM 04/18/99 [redacted]w[redacted]d  8 lb (3.629 kg) M SVD     1 TRM 04/12/95 [redacted]w[redacted]d  4 lb (1.814 kg) M SVD         Past Surgical History  Procedure Laterality Date  . Tubal ligation    . Colposcopy    . Oophorectomy      Left    History   Social History  . Marital Status: Single    Spouse Name: N/A    Number of Children: N/A  . Years of Education: N/A   Occupational History  . Not on file.   Social History Main Topics  . Smoking status: Never Smoker   . Smokeless tobacco: Never Used  . Alcohol Use: No  . Drug Use: No  . Sexual Activity: Yes    Birth Control/ Protection: Condom, Surgical   Other Topics Concern  . Not on file   Social History Narrative  . No narrative on file    No current facility-administered medications on file prior to encounter.   Current Outpatient Prescriptions on File Prior to  Encounter  Medication Sig Dispense Refill  . potassium chloride SA (K-DUR,KLOR-CON) 20 MEQ tablet Take 1 tablet (20 mEq total) by mouth 2 (two) times daily.  180 tablet  3  . spironolactone (ALDACTONE) 50 MG tablet Take 1 tablet (50 mg total) by mouth daily.  90 tablet  3  . verapamil (CALAN-SR) 180 MG CR tablet TAKE 1 tablet two times a day  180 tablet  3    Allergies  Allergen Reactions  . Sulfa Antibiotics Rash and Other (See Comments)    "Burning"    ROS Pertinent items in HPI  PHYSICAL EXAM Filed Vitals:   02/28/14 1921  BP: 131/77  Pulse: 82  Temp: 98.3 F (36.8 C)  Resp: 20   General: Well nourished, well developed female in no acute distress Cardiovascular: Normal rate Respiratory: Normal effort Abdomen: Soft, nontender Back: No CVAT, minimal paraspinous L-S TTP Extremities: No edema Neurologic: Alert and oriented Speculum exam: NEFG; vagina with thin discharge, no blood; cervix clean Bimanual exam: cervix closed, no CMT; unable to outline uterus due to body habitus, nontender; no adnexal tenderness  or masses   LAB RESULTS Results for orders placed during the hospital encounter of 02/28/14 (from the past 24 hour(s))  URINALYSIS, ROUTINE W REFLEX MICROSCOPIC     Status: Abnormal   Collection Time    02/28/14  7:00 PM      Result Value Ref Range   Color, Urine YELLOW  YELLOW   APPearance CLEAR  CLEAR   Specific Gravity, Urine >1.030 (*) 1.005 - 1.030   pH 5.5  5.0 - 8.0   Glucose, UA NEGATIVE  NEGATIVE mg/dL   Hgb urine dipstick NEGATIVE  NEGATIVE   Bilirubin Urine NEGATIVE  NEGATIVE   Ketones, ur NEGATIVE  NEGATIVE mg/dL   Protein, ur NEGATIVE  NEGATIVE mg/dL   Urobilinogen, UA 0.2  0.0 - 1.0 mg/dL   Nitrite NEGATIVE  NEGATIVE   Leukocytes, UA NEGATIVE  NEGATIVE  POCT PREGNANCY, URINE     Status: None   Collection Time    02/28/14  7:10 PM      Result Value Ref Range   Preg Test, Ur NEGATIVE  NEGATIVE  WET PREP, GENITAL     Status: Abnormal    Collection Time    02/28/14  8:03 PM      Result Value Ref Range   Yeast Wet Prep HPF POC NONE SEEN  NONE SEEN   Trich, Wet Prep NONE SEEN  NONE SEEN   Clue Cells Wet Prep HPF POC FEW (*) NONE SEEN   WBC, Wet Prep HPF POC FEW (*) NONE SEEN  CBC     Status: Abnormal   Collection Time    02/28/14  8:20 PM      Result Value Ref Range   WBC 12.2 (*) 4.0 - 10.5 K/uL   RBC 3.72 (*) 3.87 - 5.11 MIL/uL   Hemoglobin 9.9 (*) 12.0 - 15.0 g/dL   HCT 31.7 (*) 36.0 - 46.0 %   MCV 85.2  78.0 - 100.0 fL   MCH 26.6  26.0 - 34.0 pg   MCHC 31.2  30.0 - 36.0 g/dL   RDW 14.0  11.5 - 15.5 %   Platelets 296  150 - 400 K/uL    IMAGING No results found.  MAU COURSE GC/CT done 2015: Care assumed by Rozelle Logan PA; labs pending  ASSESSMENT  1. Abdominal pain   2. BV (bacterial vaginosis)      PLAN Discharge home Rx for Flagyl sent to patient's pharmacy Patient advised to continue ibuprofen PRN for pain Patient advised to call the [facility] for an appointment for annual exam; she is a current patient Patient may return to MAU as needed or if her condition were to change or worsen  Farris Has, PA-C 02/28/2014 8:39 PM

## 2014-02-28 NOTE — MAU Provider Note (Signed)
Attestation of Attending Supervision of Advanced Practitioner (CNM/NP): Evaluation and management procedures were performed by the Advanced Practitioner under my supervision and collaboration.  I have reviewed the Advanced Practitioner's note and chart, and I agree with the management and plan.  Lavonia Drafts 8:46 PM

## 2014-02-28 NOTE — Discharge Instructions (Signed)
Bacterial Vaginosis Bacterial vaginosis is an infection of the vagina. It happens when too many of certain germs (bacteria) grow in the vagina. HOME CARE  Take your medicine as told by your doctor.  Finish your medicine even if you start to feel better.  Do not have sex until you finish your medicine and are better.  Tell your sex partner that you have an infection. They should see their doctor for treatment.  Practice safe sex. Use condoms. Have only one sex partner. GET HELP IF:  You are not getting better after 3 days of treatment.  You have more grey fluid (discharge) coming from your vagina than before.  You have more pain than before.  You have a fever. MAKE SURE YOU:   Understand these instructions.  Will watch your condition.  Will get help right away if you are not doing well or get worse. Document Released: 07/05/2008 Document Revised: 07/17/2013 Document Reviewed: 05/08/2013 [employer] Patient Information 2014 [employer].  

## 2014-02-28 NOTE — MAU Note (Signed)
Patient states she has been having lower abdominal pain for about 1 1/2 weeks. Thinks might be a UTI. Denies bleeding or discharge. Has had a BTL.

## 2014-02-28 NOTE — MAU Note (Addendum)
PT SAYS SHE FEELS LOWER ABD PAIN  AND  GOES TO BACK-  DULL-  STARTED 5-10.    SHE  TAKES IBUPROFEN-  NONE TODAY.  YELLOW  VAG D/C-  NO ITCH.   LAST SEX-  5-15   GOES TO [facility]   FOR BP.      GOES TO GYN  CLINIC-  FOR  GYN C/O-     LAST SEEN- 01-2013

## 2014-03-01 LAB — GC/CHLAMYDIA PROBE AMP
CT Probe RNA: NEGATIVE
GC PROBE AMP APTIMA: NEGATIVE

## 2014-03-05 ENCOUNTER — Encounter (HOSPITAL_COMMUNITY): Payer: Self-pay | Admitting: Emergency Medicine

## 2014-03-05 ENCOUNTER — Emergency Department (HOSPITAL_COMMUNITY): Payer: Medicaid Other

## 2014-03-05 DIAGNOSIS — E669 Obesity, unspecified: Secondary | ICD-10-CM | POA: Insufficient documentation

## 2014-03-05 DIAGNOSIS — I10 Essential (primary) hypertension: Secondary | ICD-10-CM | POA: Insufficient documentation

## 2014-03-05 DIAGNOSIS — N949 Unspecified condition associated with female genital organs and menstrual cycle: Secondary | ICD-10-CM | POA: Insufficient documentation

## 2014-03-05 DIAGNOSIS — Z8619 Personal history of other infectious and parasitic diseases: Secondary | ICD-10-CM | POA: Insufficient documentation

## 2014-03-05 DIAGNOSIS — Z3202 Encounter for pregnancy test, result negative: Secondary | ICD-10-CM | POA: Insufficient documentation

## 2014-03-05 DIAGNOSIS — Z8719 Personal history of other diseases of the digestive system: Secondary | ICD-10-CM | POA: Insufficient documentation

## 2014-03-05 DIAGNOSIS — Z79899 Other long term (current) drug therapy: Secondary | ICD-10-CM | POA: Insufficient documentation

## 2014-03-05 DIAGNOSIS — J45909 Unspecified asthma, uncomplicated: Secondary | ICD-10-CM | POA: Insufficient documentation

## 2014-03-05 DIAGNOSIS — Z862 Personal history of diseases of the blood and blood-forming organs and certain disorders involving the immune mechanism: Secondary | ICD-10-CM | POA: Insufficient documentation

## 2014-03-05 LAB — CBC
HCT: 31.3 % — ABNORMAL LOW (ref 36.0–46.0)
Hemoglobin: 9.6 g/dL — ABNORMAL LOW (ref 12.0–15.0)
MCH: 26.2 pg (ref 26.0–34.0)
MCHC: 30.7 g/dL (ref 30.0–36.0)
MCV: 85.3 fL (ref 78.0–100.0)
PLATELETS: 287 10*3/uL (ref 150–400)
RBC: 3.67 MIL/uL — AB (ref 3.87–5.11)
RDW: 14.2 % (ref 11.5–15.5)
WBC: 10.1 10*3/uL (ref 4.0–10.5)

## 2014-03-05 LAB — BASIC METABOLIC PANEL
BUN: 10 mg/dL (ref 6–23)
CO2: 25 mEq/L (ref 19–32)
Calcium: 8.8 mg/dL (ref 8.4–10.5)
Chloride: 102 mEq/L (ref 96–112)
Creatinine, Ser: 0.88 mg/dL (ref 0.50–1.10)
GFR calc Af Amer: >90 mL/min (ref >90)
GFR calc non Af Amer: 84 mL/min — ABNORMAL LOW (ref >90)
Glucose, Bld: 103 mg/dL — ABNORMAL HIGH (ref 70–99)
Potassium: 3.9 mEq/L (ref 3.7–5.3)
Sodium: 139 mEq/L (ref 137–147)

## 2014-03-05 LAB — POC URINE PREG, ED: Preg Test, Ur: NEGATIVE

## 2014-03-05 LAB — I-STAT TROPONIN, ED: TROPONIN I, POC: 0 ng/mL (ref 0.00–0.08)

## 2014-03-05 NOTE — ED Notes (Signed)
Per Patient: Patient reports she has been having palpitations, SOB, and back pain x2 days. Pt reports history of low potassium. States she has been sweating more than normal due to increase workload. Ax4, NAD.

## 2014-03-06 ENCOUNTER — Emergency Department (HOSPITAL_COMMUNITY)
Admission: EM | Admit: 2014-03-06 | Discharge: 2014-03-06 | Disposition: A | Discharge status: Home / Self Care | Payer: Medicaid Other | Attending: Emergency Medicine | Admitting: Emergency Medicine

## 2014-03-06 DIAGNOSIS — R102 Pelvic and perineal pain: Secondary | ICD-10-CM

## 2014-03-06 DIAGNOSIS — J9801 Acute bronchospasm: Secondary | ICD-10-CM

## 2014-03-06 LAB — WET PREP, GENITAL
CLUE CELLS WET PREP: NONE SEEN
Trich, Wet Prep: NONE SEEN
Yeast Wet Prep HPF POC: NONE SEEN

## 2014-03-06 LAB — URINALYSIS, ROUTINE W REFLEX MICROSCOPIC
Bilirubin Urine: NEGATIVE
GLUCOSE, UA: NEGATIVE mg/dL
HGB URINE DIPSTICK: NEGATIVE
Ketones, ur: NEGATIVE mg/dL
Leukocytes, UA: NEGATIVE
Nitrite: NEGATIVE
PH: 6.5 (ref 5.0–8.0)
PROTEIN: NEGATIVE mg/dL
Specific Gravity, Urine: 1.009 (ref 1.005–1.030)
Urobilinogen, UA: 1 mg/dL (ref 0.0–1.0)

## 2014-03-06 LAB — GC/CHLAMYDIA PROBE AMP
CT PROBE, AMP APTIMA: NEGATIVE
GC Probe RNA: NEGATIVE

## 2014-03-06 MED ORDER — ALBUTEROL SULFATE HFA 108 (90 BASE) MCG/ACT IN AERS
2.0000 | INHALATION_SPRAY | RESPIRATORY_TRACT | Status: DC | PRN
  Administered 2014-03-06: 2 via RESPIRATORY_TRACT
  Filled 2014-03-06: qty 6.7

## 2014-03-06 MED ORDER — AEROCHAMBER PLUS W/MASK MISC
1.0000 | Freq: Once | Status: AC
  Administered 2014-03-06: 1
  Filled 2014-03-06: qty 1

## 2014-03-06 NOTE — ED Notes (Signed)
Dr. Molpus at bedside. 

## 2014-03-06 NOTE — ED Notes (Signed)
Pelvic cart set up and at bedside. 

## 2014-03-06 NOTE — ED Notes (Signed)
Pt ambulated to restroom with slow, steady gait. 

## 2014-03-06 NOTE — Discharge Instructions (Signed)
Bronchospasm, Adult A bronchospasm is a spasm or tightening of the airways going into the lungs. During a bronchospasm breathing becomes more difficult because the airways get smaller. When this happens there can be coughing, a whistling sound when breathing (wheezing), and difficulty breathing. Bronchospasm is often associated with asthma, but not all patients who experience a bronchospasm have asthma. CAUSES  A bronchospasm is caused by inflammation or irritation of the airways. The inflammation or irritation may be triggered by:   Allergies (such as to animals, pollen, food, or mold). Allergens that cause bronchospasm may cause wheezing immediately after exposure or many hours later.   Infection. Viral infections are believed to be the most common cause of bronchospasm.   Exercise.   Irritants (such as pollution, cigarette smoke, strong odors, aerosol sprays, and paint fumes).   Weather changes. Winds increase molds and pollens in the air. Rain refreshes the air by washing irritants out. Cold air may cause inflammation.   Stress and emotional upset.  SIGNS AND SYMPTOMS   Wheezing.   Excessive nighttime coughing.   Frequent or severe coughing with a simple cold.   Chest tightness.   Shortness of breath.  DIAGNOSIS  Bronchospasm is usually diagnosed through a history and physical exam. Tests, such as chest X-rays, are sometimes done to look for other conditions. TREATMENT   Inhaled medicines can be given to open up your airways and help you breathe. The medicines can be given using either an inhaler or a nebulizer machine.  Corticosteroid medicines may be given for severe bronchospasm, usually when it is associated with asthma. HOME CARE INSTRUCTIONS   Always have a plan prepared for seeking medical care. Know when to call your health care provider and local emergency services (911 in the [place] Know where you can access local emergency care.  Only take medicines as  directed by your health care provider.  If you were prescribed an inhaler or nebulizer machine, ask your health care provider to explain how to use it correctly. Always use a spacer with your inhaler if you were given one.  It is necessary to remain calm during an attack. Try to relax and breathe more slowly.  Control your home environment in the following ways:   Change your heating and air conditioning filter at least once a month.   Limit your use of fireplaces and wood stoves.  Do not smoke and do not allow smoking in your home.   Avoid exposure to perfumes and fragrances.   Get rid of pests (such as roaches and mice) and their droppings.   Throw away plants if you see mold on them.   Keep your house clean and dust free.   Replace carpet with wood, tile, or vinyl flooring. Carpet can trap dander and dust.   Use allergy-proof pillows, mattress covers, and box spring covers.   Wash bed sheets and blankets every week in hot water and dry them in a dryer.   Use blankets that are made of polyester or cotton.   Wash hands frequently. SEEK MEDICAL CARE IF:   You have muscle aches.   You have chest pain.   The sputum changes from clear or white to yellow, green, gray, or bloody.   The sputum you cough up gets thicker.   There are problems that may be related to the medicine you are given, such as a rash, itching, swelling, or trouble breathing.  SEEK IMMEDIATE MEDICAL CARE IF:   You have worsening wheezing and coughing  even after taking your prescribed medicines.   You have increased difficulty breathing.   You develop severe chest pain. MAKE SURE YOU:   Understand these instructions.  Will watch your condition.  Will get help right away if you are not doing well or get worse. Document Released: 09/29/2003 Document Revised: 05/29/2013 Document Reviewed: 03/18/2013 [facility] Patient Information 2014 [place].

## 2014-03-06 NOTE — ED Provider Notes (Signed)
CSN: [id number]     Arrival date & time 03/05/14  2022 History   First MD Initiated Contact with Patient 03/06/14 0117     Chief Complaint  Patient presents with  . Abdominal Pain     (Consider location/radiation/quality/duration/timing/severity/associated sxs/prior Treatment) HPI This is a 38 year old female with a three-day history of suprapubic pain and back pain. Symptoms are moderate. Her pain is worse with ambulation. She has had chills but denies fever, nausea, vomiting, dysuria, vaginal bleeding or vaginal discharge. She was recently treated for bacterial vaginosis. She also states she has been short of breath recently and has a remote history of asthma. She has a history of PVCs and states these have been more frequent than usual the past few days.  Past Medical History  Diagnosis Date  . Hypertension   . Fibroid   . Anemia   . Abnormal Pap smear   . Hypopotassemia   . Esophageal reflux   . Palpitations   . Pregnancy induced hypertension   . Dysrhythmia     pvc's  . Asthma     as child  . Obesity    Past Surgical History  Procedure Laterality Date  . Tubal ligation    . Colposcopy    . Oophorectomy      Left   Family History  Problem Relation Age of Onset  . Hypertension Father   . Kidney disease Father   . Cancer Father   . Diabetes Father   . Diabetes Maternal Grandmother   . Hypertension Paternal Grandmother    History  Substance Use Topics  . Smoking status: Never Smoker   . Smokeless tobacco: Never Used  . Alcohol Use: No   OB History   Grav Para Term Preterm Abortions TAB SAB Ect Mult Living   2 2 2       2      Review of Systems  All other systems reviewed and are negative.  Allergies  Sulfa antibiotics  Home Medications   Prior to Admission medications   Medication Sig Start Date End Date Taking? Authorizing Provider  metroNIDAZOLE (FLAGYL) 500 MG tablet Take 1 tablet (500 mg total) by mouth 2 (two) times daily. 02/28/14  Yes Farris Has, PA-C  potassium chloride SA (K-DUR,KLOR-CON) 20 MEQ tablet Take 1 tablet (20 mEq total) by mouth 2 (two) times daily. 06/18/13  Yes Burtis Junes, NP  spironolactone (ALDACTONE) 50 MG tablet Take 1 tablet (50 mg total) by mouth daily. 06/18/13  Yes Burtis Junes, NP  verapamil (CALAN-SR) 180 MG CR tablet TAKE 1 tablet two times a day 06/18/13  Yes Burtis Junes, NP   BP 118/53  Pulse 91  Temp(Src) 98.9 F (37.2 C) (Oral)  Resp 20  Ht 5' 9.5" (1.765 m)  Wt 282 lb (127.914 kg)  BMI 41.06 kg/m2  SpO2 97%  LMP 02/24/2014  Physical Exam General: Well-developed, well-nourished female in no acute distress; appearance consistent with age of record HENT: normocephalic; atraumatic Eyes: pupils equal, round and reactive to light; extraocular muscles intact Neck: supple Heart: regular rate and rhythm Lungs: Decreased air movement bilaterally without wheezing Abdomen: soft; obese; suprapubic tenderness; no masses or hepatosplenomegaly; bowel sounds present GU: External genitalia; whitish vaginal discharge; no vaginal bleeding; no cervical motion tenderness; no adnexal tenderness; bladder tenderness Back: Generalized low back tenderness Extremities: No deformity; full range of motion; pulses normal Neurologic: Awake, alert and oriented; motor function intact in all extremities and symmetric; no facial droop Skin:  Warm and dry Psychiatric: Normal mood and affect    ED Course  Procedures (including critical care time)   EKG Interpretation   Date/Time:  Wednesday Mar 05 2014 20:49:17 EDT Ventricular Rate:  90 PR Interval:  174 QRS Duration: 82 QT Interval:  370 QTC Calculation: 452 R Axis:   34 Text Interpretation:  Normal sinus rhythm Normal ECG No significant change  was found Confirmed by Saria Haran  MD, Jenny Reichmann ([id number]) on 03/06/2014 12:39:59 AM      MDM   Nursing notes and vitals signs, including pulse oximetry, reviewed.  Summary of this visit's results, reviewed by  myself:  Labs:  Results for orders placed during the hospital encounter of 03/06/14 (from the past 24 hour(s))  CBC     Status: Abnormal   Collection Time    03/05/14  9:14 PM      Result Value Ref Range   WBC 10.1  4.0 - 10.5 K/uL   RBC 3.67 (*) 3.87 - 5.11 MIL/uL   Hemoglobin 9.6 (*) 12.0 - 15.0 g/dL   HCT 31.3 (*) 36.0 - 46.0 %   MCV 85.3  78.0 - 100.0 fL   MCH 26.2  26.0 - 34.0 pg   MCHC 30.7  30.0 - 36.0 g/dL   RDW 14.2  11.5 - 15.5 %   Platelets 287  150 - 400 K/uL  BASIC METABOLIC PANEL     Status: Abnormal   Collection Time    03/05/14  9:14 PM      Result Value Ref Range   Sodium 139  137 - 147 mEq/L   Potassium 3.9  3.7 - 5.3 mEq/L   Chloride 102  96 - 112 mEq/L   CO2 25  19 - 32 mEq/L   Glucose, Bld 103 (*) 70 - 99 mg/dL   BUN 10  6 - 23 mg/dL   Creatinine, Ser 0.88  0.50 - 1.10 mg/dL   Calcium 8.8  8.4 - 10.5 mg/dL   GFR calc non Af Amer 84 (*) >90 mL/min   GFR calc Af Amer >90  >90 mL/min  I-STAT TROPOININ, ED     Status: None   Collection Time    03/05/14  9:25 PM      Result Value Ref Range   Troponin i, poc 0.00  0.00 - 0.08 ng/mL   Comment 3           POC URINE PREG, ED     Status: None   Collection Time    03/05/14  9:31 PM      Result Value Ref Range   Preg Test, Ur NEGATIVE  NEGATIVE  URINALYSIS, ROUTINE W REFLEX MICROSCOPIC     Status: None   Collection Time    03/06/14  1:33 AM      Result Value Ref Range   Color, Urine YELLOW  YELLOW   APPearance CLEAR  CLEAR   Specific Gravity, Urine 1.009  1.005 - 1.030   pH 6.5  5.0 - 8.0   Glucose, UA NEGATIVE  NEGATIVE mg/dL   Hgb urine dipstick NEGATIVE  NEGATIVE   Bilirubin Urine NEGATIVE  NEGATIVE   Ketones, ur NEGATIVE  NEGATIVE mg/dL   Protein, ur NEGATIVE  NEGATIVE mg/dL   Urobilinogen, UA 1.0  0.0 - 1.0 mg/dL   Nitrite NEGATIVE  NEGATIVE   Leukocytes, UA NEGATIVE  NEGATIVE  WET PREP, GENITAL     Status: Abnormal   Collection Time    03/06/14  2:10 AM  Result Value Ref Range   Yeast  Wet Prep HPF POC NONE SEEN  NONE SEEN   Trich, Wet Prep NONE SEEN  NONE SEEN   Clue Cells Wet Prep HPF POC NONE SEEN  NONE SEEN   WBC, Wet Prep HPF POC FEW (*) NONE SEEN    Imaging Studies: Dg Chest 2 View  03/05/2014   CLINICAL DATA:  38 year old female with shortness of breath and palpitations. Pain and nausea. Initial encounter.  EXAM: CHEST  2 VIEW  COMPARISON:  07/24/2012 and earlier.  FINDINGS: Lower lung volumes. Normal cardiac size and mediastinal contours. Visualized tracheal air column is within normal limits. Lungs remain clear. No pneumothorax or effusion. No acute osseous abnormality identified.  IMPRESSION: Low lung volumes, otherwise no acute cardiopulmonary abnormality.   Electronically Signed   By: Lars Pinks M.D.   On: 03/05/2014 23:29   4:28 AM Air movement improved and patient feels better after albuterol treatment. She was given an inhaler and instructed in his shoes. She is advised that the cause of her pelvic discomfort was not evident on exam. She was advised to return if symptoms worsen.     Wynetta Fines, MD 03/06/14 [phone]

## 2014-04-01 ENCOUNTER — Encounter: Payer: Self-pay | Admitting: Advanced Practice Midwife

## 2014-04-01 ENCOUNTER — Encounter: Payer: Medicaid Other | Admitting: Advanced Practice Midwife

## 2014-04-22 ENCOUNTER — Telehealth: Payer: Self-pay | Admitting: Nurse Practitioner

## 2014-04-22 ENCOUNTER — Other Ambulatory Visit: Payer: Self-pay

## 2014-04-22 ENCOUNTER — Emergency Department (HOSPITAL_COMMUNITY): Payer: Medicaid Other

## 2014-04-22 ENCOUNTER — Encounter (HOSPITAL_COMMUNITY): Payer: Self-pay | Admitting: Emergency Medicine

## 2014-04-22 ENCOUNTER — Emergency Department (HOSPITAL_COMMUNITY)
Admission: EM | Admit: 2014-04-22 | Discharge: 2014-04-22 | Disposition: A | Discharge status: Home / Self Care | Payer: Medicaid Other | Attending: Emergency Medicine | Admitting: Emergency Medicine

## 2014-04-22 DIAGNOSIS — R0789 Other chest pain: Secondary | ICD-10-CM | POA: Insufficient documentation

## 2014-04-22 DIAGNOSIS — I499 Cardiac arrhythmia, unspecified: Secondary | ICD-10-CM | POA: Insufficient documentation

## 2014-04-22 DIAGNOSIS — Z8541 Personal history of malignant neoplasm of cervix uteri: Secondary | ICD-10-CM | POA: Diagnosis not present

## 2014-04-22 DIAGNOSIS — R609 Edema, unspecified: Secondary | ICD-10-CM | POA: Insufficient documentation

## 2014-04-22 DIAGNOSIS — E669 Obesity, unspecified: Secondary | ICD-10-CM | POA: Insufficient documentation

## 2014-04-22 DIAGNOSIS — E876 Hypokalemia: Secondary | ICD-10-CM | POA: Insufficient documentation

## 2014-04-22 DIAGNOSIS — R600 Localized edema: Secondary | ICD-10-CM

## 2014-04-22 DIAGNOSIS — I10 Essential (primary) hypertension: Secondary | ICD-10-CM | POA: Diagnosis not present

## 2014-04-22 DIAGNOSIS — Z862 Personal history of diseases of the blood and blood-forming organs and certain disorders involving the immune mechanism: Secondary | ICD-10-CM | POA: Insufficient documentation

## 2014-04-22 DIAGNOSIS — Z79899 Other long term (current) drug therapy: Secondary | ICD-10-CM | POA: Insufficient documentation

## 2014-04-22 DIAGNOSIS — Z8719 Personal history of other diseases of the digestive system: Secondary | ICD-10-CM | POA: Diagnosis not present

## 2014-04-22 DIAGNOSIS — J45909 Unspecified asthma, uncomplicated: Secondary | ICD-10-CM | POA: Insufficient documentation

## 2014-04-22 DIAGNOSIS — R079 Chest pain, unspecified: Secondary | ICD-10-CM | POA: Diagnosis present

## 2014-04-22 LAB — CBC
HCT: 36.7 % (ref 36.0–46.0)
Hemoglobin: 11.3 g/dL — ABNORMAL LOW (ref 12.0–15.0)
MCH: 26.3 pg (ref 26.0–34.0)
MCHC: 30.8 g/dL (ref 30.0–36.0)
MCV: 85.3 fL (ref 78.0–100.0)
Platelets: 333 10*3/uL (ref 150–400)
RBC: 4.3 MIL/uL (ref 3.87–5.11)
RDW: 14.6 % (ref 11.5–15.5)
WBC: 8.2 10*3/uL (ref 4.0–10.5)

## 2014-04-22 LAB — BASIC METABOLIC PANEL
Anion gap: 13 (ref 5–15)
BUN: 13 mg/dL (ref 6–23)
CO2: 26 mEq/L (ref 19–32)
Calcium: 9.2 mg/dL (ref 8.4–10.5)
Chloride: 99 mEq/L (ref 96–112)
Creatinine, Ser: 0.79 mg/dL (ref 0.50–1.10)
GFR calc Af Amer: >90 mL/min (ref >90)
GFR calc non Af Amer: >90 mL/min (ref >90)
Glucose, Bld: 90 mg/dL (ref 70–99)
Potassium: 4.2 mEq/L (ref 3.7–5.3)
Sodium: 138 mEq/L (ref 137–147)

## 2014-04-22 LAB — I-STAT TROPONIN, ED: Troponin i, poc: 0 ng/mL (ref 0.00–0.08)

## 2014-04-22 NOTE — Telephone Encounter (Signed)
New problem   Pt having chest pain and leg swelling.

## 2014-04-22 NOTE — Telephone Encounter (Signed)
Agree with sending to the hospital.

## 2014-04-22 NOTE — Discharge Instructions (Signed)

## 2014-04-22 NOTE — ED Notes (Signed)
Pt states shes had CP and swelling in her BLE for past few days. She states the pain is a dull ache now.

## 2014-04-22 NOTE — Telephone Encounter (Signed)
Trish cardmaster at [facility] notified at [phone].

## 2014-04-22 NOTE — Telephone Encounter (Signed)
Pt calling to inform Truitt Merle, NP and CMA that she would like to be seen today for cp.  Pt states she's having dull chest pain, midsternal, in location.  Pt states it does not radiate.  Pt rates her cp on a scale of 0-10, a 6. Pt states that nothing makes the cp go away.  Pt states she is also experiencing DOE.  Pt also states she has noted bilateral LEE.  Advised pt, given her current symptoms that she's describing, she should go to [facility] ED for further cardiac work-up. Informed pt that I can notify our card master Trish that she will be coming via private vehicle.  Pt verbalized understanding and agrees with this plan.  Will route this message to Truitt Merle, NP for further review.

## 2014-05-01 NOTE — ED Provider Notes (Signed)
CSN: [id number]     Arrival date & time 04/22/14  1008 History   First MD Initiated Contact with Patient 04/22/14 1039     Chief Complaint  Patient presents with  . Chest Pain     (Consider location/radiation/quality/duration/timing/severity/associated sxs/prior Treatment) HPI  38 year old female with chest pain and swelling in her legs. Gradual onset 2-3 days ago. Describes pain as an ache in her right/center chest. Initially was sharp, but now a dull ache. This pain has been constant since onset. Does not radiate. Some mild shortness of breath. No change in her chest pain with exertion, breathing does feel more labored. No fevers or chills. No cough. Increased lower extremity edema over the same time period.  Past Medical History  Diagnosis Date  . Hypertension   . Fibroid   . Anemia   . Abnormal Pap smear   . Hypopotassemia   . Esophageal reflux   . Palpitations   . Pregnancy induced hypertension   . Dysrhythmia     pvc's  . Asthma     as child  . Obesity    Past Surgical History  Procedure Laterality Date  . Tubal ligation    . Colposcopy    . Oophorectomy      Left   Family History  Problem Relation Age of Onset  . Hypertension Father   . Kidney disease Father   . Cancer Father   . Diabetes Father   . Diabetes Maternal Grandmother   . Hypertension Paternal Grandmother    History  Substance Use Topics  . Smoking status: Never Smoker   . Smokeless tobacco: Never Used  . Alcohol Use: No   OB History   Grav Para Term Preterm Abortions TAB SAB Ect Mult Living   2 2 2       2      Review of Systems  All systems reviewed and negative, other than as noted in HPI.   Allergies  Sulfa antibiotics  Home Medications   Prior to Admission medications   Medication Sig Start Date End Date Taking? Authorizing Provider  albuterol (PROVENTIL HFA;VENTOLIN HFA) 108 (90 BASE) MCG/ACT inhaler Inhale 1-2 puffs into the lungs every 6 (six) hours as needed for wheezing  or shortness of breath.   Yes Historical Provider, MD  potassium chloride SA (K-DUR,KLOR-CON) 20 MEQ tablet Take 20 mEq by mouth 2 (two) times daily.   Yes Historical Provider, MD  spironolactone (ALDACTONE) 50 MG tablet Take 50 mg by mouth daily.   Yes Historical Provider, MD  verapamil (CALAN-SR) 180 MG CR tablet Take 180 mg by mouth 2 (two) times daily.   Yes Historical Provider, MD   BP 149/75  Pulse 85  Temp(Src) 97.8 F (36.6 C) (Oral)  Resp 15  Ht 5' 9.5" (1.765 m)  Wt 290 lb 1.6 oz (131.588 kg)  BMI 42.24 kg/m2  SpO2 100%  LMP 03/26/2014 Physical Exam  Nursing note and vitals reviewed. Constitutional: No distress.  Laying in bed. No acute distress. Obese.  HENT:  Head: Normocephalic and atraumatic.  Eyes: Conjunctivae are normal. Right eye exhibits no discharge. Left eye exhibits no discharge.  Neck: Neck supple.  Cardiovascular: Normal rate, regular rhythm and normal heart sounds.  Exam reveals no gallop and no friction rub.   No murmur heard. Pulmonary/Chest: Effort normal and breath sounds normal. No respiratory distress. She exhibits tenderness.  Patient points to her right upper parasternal border this area of maximal pain. She reports that there is tenderness fair  with palpation. No crepitus. Overlying skin changes.  Abdominal: Soft. She exhibits no distension. There is no tenderness.  Musculoskeletal: She exhibits no edema and no tenderness.  Lower extremities are symmetric as compared to each other. Patient has a large body habitus. Difficulty discerning edema versus being obese. No calf tenderness. Negative Homans.  Neurological: She is alert.  Skin: Skin is warm and dry.  Psychiatric: She has a normal mood and affect. Her behavior is normal. Thought content normal.    ED Course  Procedures (including critical care time) Labs Review Labs Reviewed  CBC - Abnormal; Notable for the following:    Hemoglobin 11.3 (*)    All other components within normal limits   BASIC METABOLIC PANEL  I-STAT TROPOININ, ED    Imaging Review No results found.   EKG Interpretation   Date/Time:  Tuesday April 22 2014 10:13:37 EDT Ventricular Rate:  91 PR Interval:  158 QRS Duration: 82 QT Interval:  350 QTC Calculation: 430 R Axis:   25 Text Interpretation:  Normal sinus rhythm Normal ECG ED PHYSICIAN  INTERPRETATION AVAILABLE IN [facility] Confirmed by TEST, Record  ([id number]) on 04/24/2014 7:38:38 AM      MDM   Final diagnoses:  Chest discomfort  Bilateral edema of lower extremity    38 year old female with chest pain and worsening lower extremity edema. She is in no acute distress. EKG without overt ischemic changes. Some of the features of her pain are atypical for ACS such as the constant duration for the past couple days reproducibility on exam. Prescribed history on exertion is somewhat concerning, but objectively things look reassure. Her lungs are clear. She has no increased work of breathing. Oxygen saturations are good on room air. Her chest x-ray is clear. I have a low clinical suspicion for pulmonary embolism. I feel she is stable for discharge at this time. Return precautions were discussed.    Virgel Manifold, MD 05/01/14 1031

## 2014-05-08 ENCOUNTER — Encounter: Payer: Medicaid Other | Admitting: Physician Assistant

## 2014-05-08 NOTE — Progress Notes (Deleted)
   Cardiology Office Note    Date:  05/08/2014   ID:  Tammie [name], DOB [birth year], MRN [id number]  PCP:  No PCP Per Patient  Cardiologist:  Dr. Jenell Milliner >>> Truitt Merle, NP     History of Present Illness: Tammie [name] is a 38 y.o. female with a hx of HTN, PVCs, obesity.  Last seen 06/2013.  Recent visit to ED 04/22/14 with chest pain and LE edema.  CEs were negative.  CXR was not acute.  She was asked to follow up here.  ***   Recent Labs/Images: 06/18/2013: ALT 15; HDL Cholesterol by NMR 42.60; LDL (calc) 125*  04/22/2014: Creatinine 0.79; Hemoglobin 11.3*; Potassium 4.2   Dg Chest 2 View  04/22/2014    IMPRESSION: No active cardiopulmonary disease.   Electronically Signed   By: Kathreen Devoid   On: 04/22/2014 11:01     Wt Readings from Last 3 Encounters:  04/22/14 290 lb 1.6 oz (131.588 kg)  03/05/14 282 lb (127.914 kg)  02/28/14 292 lb (132.45 kg)     Past Medical History  Diagnosis Date  . Hypertension   . Fibroid   . Anemia   . Abnormal Pap smear   . Hypopotassemia   . Esophageal reflux   . Palpitations   . Pregnancy induced hypertension   . Dysrhythmia     pvc's  . Asthma     as child  . Obesity     Current Outpatient Prescriptions  Medication Sig Dispense Refill  . albuterol (PROVENTIL HFA;VENTOLIN HFA) 108 (90 BASE) MCG/ACT inhaler Inhale 1-2 puffs into the lungs every 6 (six) hours as needed for wheezing or shortness of breath.      . potassium chloride SA (K-DUR,KLOR-CON) 20 MEQ tablet Take 20 mEq by mouth 2 (two) times daily.      Marland Kitchen spironolactone (ALDACTONE) 50 MG tablet Take 50 mg by mouth daily.      . verapamil (CALAN-SR) 180 MG CR tablet Take 180 mg by mouth 2 (two) times daily.       No current facility-administered medications for this visit.     Allergies:   Sulfa antibiotics   Social History:  The patient  reports that she has never smoked. She has never used smokeless tobacco. She reports that she does not drink alcohol or use  illicit drugs.   Family History:  The patient's family history includes Cancer in her father; Diabetes in her father and maternal grandmother; Hypertension in her father and paternal grandmother; Kidney disease in her father.   ROS:  Please see the history of present illness.   ***   All other systems reviewed and negative.   PHYSICAL EXAM: VS:  LMP 03/26/2014 Well nourished, well developed, in no acute distress HEENT: normal Neck: ***no JVD Cardiac:  normal S1, S2; ***RRR; no murmur Lungs:  ***clear to auscultation bilaterally, no wheezing, rhonchi or rales Abd: soft, nontender, no hepatomegaly Ext: ***no edema Skin: warm and dry Neuro:  CNs 2-12 intact, no focal abnormalities noted  EKG:  ***     ASSESSMENT AND PLAN:  No diagnosis found.    Disposition:  ***   Signed, Versie Starks, MHS 05/08/2014 11:52 AM    [facility] [address], [place], [place]  [address] Phone: [phone]; Fax: [phone]   This encounter was created in error - please disregard.

## 2014-06-19 ENCOUNTER — Other Ambulatory Visit: Payer: Self-pay | Admitting: Nurse Practitioner

## 2014-06-21 ENCOUNTER — Other Ambulatory Visit: Payer: Self-pay | Admitting: Nurse Practitioner

## 2014-06-21 NOTE — Telephone Encounter (Signed)
Ms. Schaffert had been out of her potassium for 2 days and was requesting a refill. I sent it in electronically.

## 2014-06-24 ENCOUNTER — Other Ambulatory Visit: Payer: Self-pay | Admitting: *Deleted

## 2014-06-24 MED ORDER — VERAPAMIL HCL ER 180 MG PO TBCR: 180.0000 mg | EXTENDED_RELEASE_TABLET | Freq: Two times a day (BID) | ORAL | Status: DC

## 2014-06-24 MED ORDER — SPIRONOLACTONE 50 MG PO TABS: 50.0000 mg | ORAL_TABLET | Freq: Every day | ORAL | Status: DC

## 2014-07-03 ENCOUNTER — Ambulatory Visit: Payer: Medicaid Other | Admitting: Nurse Practitioner

## 2014-07-04 ENCOUNTER — Ambulatory Visit: Payer: Medicaid Other | Admitting: Nurse Practitioner

## 2014-08-03 ENCOUNTER — Inpatient Hospital Stay (HOSPITAL_COMMUNITY)
Admission: AD | Admit: 2014-08-03 | Discharge: 2014-08-03 | Disposition: A | Discharge status: Home / Self Care | Payer: Medicaid Other | Source: Ambulatory Visit | Attending: Obstetrics & Gynecology | Admitting: Obstetrics & Gynecology

## 2014-08-03 ENCOUNTER — Encounter (HOSPITAL_COMMUNITY): Payer: Self-pay | Admitting: *Deleted

## 2014-08-03 DIAGNOSIS — K5900 Constipation, unspecified: Secondary | ICD-10-CM

## 2014-08-03 DIAGNOSIS — R109 Unspecified abdominal pain: Secondary | ICD-10-CM | POA: Diagnosis present

## 2014-08-03 LAB — URINALYSIS, ROUTINE W REFLEX MICROSCOPIC
Bilirubin Urine: NEGATIVE
GLUCOSE, UA: NEGATIVE mg/dL
Ketones, ur: NEGATIVE mg/dL
LEUKOCYTES UA: NEGATIVE
NITRITE: NEGATIVE
PROTEIN: 30 mg/dL — AB
Specific Gravity, Urine: 1.025 (ref 1.005–1.030)
Urobilinogen, UA: 1 mg/dL (ref 0.0–1.0)
pH: 6 (ref 5.0–8.0)

## 2014-08-03 LAB — WET PREP, GENITAL
CLUE CELLS WET PREP: NONE SEEN
Trich, Wet Prep: NONE SEEN
Yeast Wet Prep HPF POC: NONE SEEN

## 2014-08-03 LAB — URINE MICROSCOPIC-ADD ON

## 2014-08-03 LAB — HIV ANTIBODY (ROUTINE TESTING W REFLEX): HIV: NONREACTIVE

## 2014-08-03 LAB — POCT PREGNANCY, URINE: PREG TEST UR: NEGATIVE

## 2014-08-03 NOTE — MAU Note (Addendum)
Pain and pressure in lower abd. Denies bleeding or d/c. Wants to be checked for stds

## 2014-08-03 NOTE — MAU Provider Note (Signed)
History     CSN: [id number]  Arrival date and time: 08/03/14 0203   First Provider Initiated Contact with Patient 08/03/14 [phone]      Chief Complaint  Patient presents with  . Abdominal Pain   HPI  [name] is a 38 y.o. female G2P2002 at who presents with dull, lower abdominal pain; worse in the middle of her stomach. The pain started 2 days ago. Denies abnormal vaginal discharge. She has had some problems with constipation. Her biggest concern is being testing for STI's today.   OB History   Grav Para Term Preterm Abortions TAB SAB Ect Mult Living   2 2 2       2       Past Medical History  Diagnosis Date  . Hypertension   . Fibroid   . Anemia   . Abnormal Pap smear   . Hypopotassemia   . Esophageal reflux   . Palpitations   . Pregnancy induced hypertension   . Dysrhythmia     pvc's  . Asthma     as child  . Obesity     Past Surgical History  Procedure Laterality Date  . Tubal ligation    . Colposcopy    . Oophorectomy      Left    Family History  Problem Relation Age of Onset  . Hypertension Father   . Kidney disease Father   . Cancer Father   . Diabetes Father   . Diabetes Maternal Grandmother   . Hypertension Paternal Grandmother     History  Substance Use Topics  . Smoking status: Never Smoker   . Smokeless tobacco: Never Used  . Alcohol Use: No    Allergies:  Allergies  Allergen Reactions  . Sulfa Antibiotics Rash and Other (See Comments)    "Burning"    Prescriptions prior to admission  Medication Sig Dispense Refill  . albuterol (PROVENTIL HFA;VENTOLIN HFA) 108 (90 BASE) MCG/ACT inhaler Inhale 1-2 puffs into the lungs every 6 (six) hours as needed for wheezing or shortness of breath.      . potassium chloride SA (K-DUR,KLOR-CON) 20 MEQ tablet Take 20 mEq by mouth 2 (two) times daily.      Marland Kitchen spironolactone (ALDACTONE) 50 MG tablet Take 1 tablet (50 mg total) by mouth daily.  30 tablet  0  . verapamil (CALAN-SR) 180 MG  CR tablet Take 1 tablet (180 mg total) by mouth 2 (two) times daily.  60 tablet  0   Results for orders placed during the hospital encounter of 08/03/14 (from the past 48 hour(s))  URINALYSIS, ROUTINE W REFLEX MICROSCOPIC     Status: Abnormal   Collection Time    08/03/14  2:55 AM      Result Value Ref Range   Color, Urine YELLOW  YELLOW   APPearance CLEAR  CLEAR   Specific Gravity, Urine 1.025  1.005 - 1.030   pH 6.0  5.0 - 8.0   Glucose, UA NEGATIVE  NEGATIVE mg/dL   Hgb urine dipstick TRACE (*) NEGATIVE   Bilirubin Urine NEGATIVE  NEGATIVE   Ketones, ur NEGATIVE  NEGATIVE mg/dL   Protein, ur 30 (*) NEGATIVE mg/dL   Urobilinogen, UA 1.0  0.0 - 1.0 mg/dL   Nitrite NEGATIVE  NEGATIVE   Leukocytes, UA NEGATIVE  NEGATIVE  URINE MICROSCOPIC-ADD ON     Status: Abnormal   Collection Time    08/03/14  2:55 AM      Result Value Ref Range  Squamous Epithelial / LPF MANY (*) RARE   WBC, UA 0-2  <3 WBC/hpf   RBC / HPF 0-2  <3 RBC/hpf   Bacteria, UA RARE  RARE  POCT PREGNANCY, URINE     Status: None   Collection Time    08/03/14  3:02 AM      Result Value Ref Range   Preg Test, Ur NEGATIVE  NEGATIVE   Comment:            THE SENSITIVITY OF THIS     METHODOLOGY IS >24 mIU/mL  WET PREP, GENITAL     Status: Abnormal   Collection Time    08/03/14  4:30 AM      Result Value Ref Range   Yeast Wet Prep HPF POC NONE SEEN  NONE SEEN   Trich, Wet Prep NONE SEEN  NONE SEEN   Clue Cells Wet Prep HPF POC NONE SEEN  NONE SEEN   WBC, Wet Prep HPF POC RARE (*) NONE SEEN   Comment: MODERATE BACTERIA SEEN  HIV ANTIBODY (ROUTINE TESTING)     Status: None   Collection Time    08/03/14  4:50 AM      Result Value Ref Range   HIV 1&2 Ab, 4th Generation NONREACTIVE  NONREACTIVE   Comment: (NOTE)     A NONREACTIVE HIV Ag/Ab result does not exclude HIV infection since     the time frame for seroconversion is variable. If acute HIV infection     is suspected, a HIV-1 RNA Qualitative TMA test is  recommended.     HIV-1/2 Antibody Diff         Not indicated.     HIV-1 RNA, Qual TMA           Not indicated.     PLEASE NOTE: This information has been disclosed to you from records     whose confidentiality may be protected by state law. If your state     requires such protection, then the state law prohibits you from making     any further disclosure of the information without the specific written     consent of the person to whom it pertains, or as otherwise permitted     by law. A general authorization for the release of medical or other     information is NOT sufficient for this purpose.     The performance of this assay has not been clinically validated in     patients less than 65 years old.     Performed at [facility]  Constitutional: Negative for fever and chills.  Gastrointestinal: Positive for abdominal pain and constipation (Its been a few days since she has had a BM ). Negative for nausea, vomiting and diarrhea.   Physical Exam   Blood pressure 132/79, pulse 82, temperature 98.2 F (36.8 C), resp. rate 20, height 5' 9.5" (1.765 m), weight 132.36 kg (291 lb 12.8 oz), last menstrual period 07/27/2014.  Physical Exam  Constitutional: She is oriented to person, place, and time. She appears well-developed and well-nourished. No distress.  HENT:  Head: Normocephalic.  Eyes: Pupils are equal, round, and reactive to light.  Neck: Neck supple.  Respiratory: Effort normal.  GI: There is tenderness in the suprapubic area. There is no rigidity, no rebound and no guarding.  Genitourinary:  Speculum exam: Vagina - Small amount of creamy, brown discharge, mild odor Cervix - No contact bleeding, no active bleeding  Bimanual exam: Cervix closed, no CMT  Uterus non tender, normal size Adnexa non tender, no masses bilaterally GC/Chlam, wet prep done Chaperone present for exam.   Neurological: She is alert and oriented to person, place, and time.   Skin: Skin is warm. She is not diaphoretic.  Psychiatric: Her behavior is normal.    MAU Course  Procedures None  MDM Wet prep GC HIV   Assessment and Plan   A: 1. Abdominal pain in female   2. Constipation, unspecified constipation type     P: Discharge home in stable condition Miralax and or metamucil recommended for constipation; as directed on the bottle Discussed the importance of a primary care Dr.  Condoms always  GC pending.    Darrelyn Hillock Rasch, NP 08/04/2014 8:28 AM

## 2014-08-03 NOTE — Progress Notes (Signed)
J Rasch NP in earlier to discuss test results and d/c plan. Written and verbal d/c instructions given and understanding voiced.

## 2014-08-03 NOTE — MAU Note (Signed)
Pt states ok to talk about personal info while significant other is in room. His name is Tammie [name]

## 2014-08-03 NOTE — Discharge Instructions (Signed)
Abdominal Pain, Women °Abdominal (stomach, pelvic, or belly) pain can be caused by many things. It is important to tell your doctor: °· The location of the pain. °· Does it come and go or is it present all the time? °· Are there things that start the pain (eating certain foods, exercise)? °· Are there other symptoms associated with the pain (fever, nausea, vomiting, diarrhea)? °All of this is helpful to know when trying to find the cause of the pain. °CAUSES  °· Stomach: virus or bacteria infection, or ulcer. °· Intestine: appendicitis (inflamed appendix), regional ileitis (Crohn's disease), ulcerative colitis (inflamed colon), irritable bowel syndrome, diverticulitis (inflamed diverticulum of the colon), or cancer of the stomach or intestine. °· Gallbladder disease or stones in the gallbladder. °· Kidney disease, kidney stones, or infection. °· Pancreas infection or cancer. °· Fibromyalgia (pain disorder). °· Diseases of the female organs: °¨ Uterus: fibroid (non-cancerous) tumors or infection. °¨ Fallopian tubes: infection or tubal pregnancy. °¨ Ovary: cysts or tumors. °¨ Pelvic adhesions (scar tissue). °¨ Endometriosis (uterus lining tissue growing in the pelvis and on the pelvic organs). °¨ Pelvic congestion syndrome (female organs filling up with blood just before the menstrual period). °¨ Pain with the menstrual period. °¨ Pain with ovulation (producing an egg). °¨ Pain with an IUD (intrauterine device, birth control) in the uterus. °¨ Cancer of the female organs. °· Functional pain (pain not caused by a disease, may improve without treatment). °· Psychological pain. °· Depression. °DIAGNOSIS  °Your doctor will decide the seriousness of your pain by doing an examination. °· Blood tests. °· X-rays. °· Ultrasound. °· CT scan (computed tomography, special type of X-ray). °· MRI (magnetic resonance imaging). °· Cultures, for infection. °· Barium enema (dye inserted in the large intestine, to better view it with  X-rays). °· Colonoscopy (looking in intestine with a lighted tube). °· Laparoscopy (minor surgery, looking in abdomen with a lighted tube). °· Major abdominal exploratory surgery (looking in abdomen with a large incision). °TREATMENT  °The treatment will depend on the cause of the pain.  °· Many cases can be observed and treated at home. °· Over-the-counter medicines recommended by your caregiver. °· Prescription medicine. °· Antibiotics, for infection. °· Birth control pills, for painful periods or for ovulation pain. °· Hormone treatment, for endometriosis. °· Nerve blocking injections. °· Physical therapy. °· Antidepressants. °· Counseling with a psychologist or psychiatrist. °· Minor or major surgery. °HOME CARE INSTRUCTIONS  °· Do not take laxatives, unless directed by your caregiver. °· Take over-the-counter pain medicine only if ordered by your caregiver. Do not take aspirin because it can cause an upset stomach or bleeding. °· Try a clear liquid diet (broth or water) as ordered by your caregiver. Slowly move to a bland diet, as tolerated, if the pain is related to the stomach or intestine. °· Have a thermometer and take your temperature several times a day, and record it. °· Bed rest and sleep, if it helps the pain. °· Avoid sexual intercourse, if it causes pain. °· Avoid stressful situations. °· Keep your follow-up appointments and tests, as your caregiver orders. °· If the pain does not go away with medicine or surgery, you may try: °¨ Acupuncture. °¨ Relaxation exercises (yoga, meditation). °¨ Group therapy. °¨ Counseling. °SEEK MEDICAL CARE IF:  °· You notice certain foods cause stomach pain. °· Your home care treatment is not helping your pain. °· You need stronger pain medicine. °· You want your IUD removed. °· You feel faint or   lightheaded. °· You develop nausea and vomiting. °· You develop a rash. °· You are having side effects or an allergy to your medicine. °SEEK IMMEDIATE MEDICAL CARE IF:  °· Your  pain does not go away or gets worse. °· You have a fever. °· Your pain is felt only in portions of the abdomen. The right side could possibly be appendicitis. The left lower portion of the abdomen could be colitis or diverticulitis. °· You are passing blood in your stools (bright red or black tarry stools, with or without vomiting). °· You have blood in your urine. °· You develop chills, with or without a fever. °· You pass out. °MAKE SURE YOU:  °· Understand these instructions. °· Will watch your condition. °· Will get help right away if you are not doing well or get worse. °Document Released: 07/24/2007 Document Revised: 02/10/2014 Document Reviewed: 08/13/2009 °ExitCare® Patient Information ©2015 [employer]. This information is not intended to replace advice given to you by your health care provider. Make sure you discuss any questions you have with your health care provider. ° °

## 2014-08-04 LAB — GC/CHLAMYDIA PROBE AMP
CT PROBE, AMP APTIMA: NEGATIVE
GC PROBE AMP APTIMA: POSITIVE — AB

## 2014-08-05 ENCOUNTER — Inpatient Hospital Stay (HOSPITAL_COMMUNITY)
Admission: AD | Admit: 2014-08-05 | Discharge: 2014-08-05 | Disposition: A | Discharge status: Home / Self Care | Payer: Medicaid Other | Source: Ambulatory Visit | Attending: Family Medicine | Admitting: Family Medicine

## 2014-08-05 ENCOUNTER — Other Ambulatory Visit: Payer: Self-pay | Admitting: *Deleted

## 2014-08-05 ENCOUNTER — Telehealth: Payer: Self-pay | Admitting: Obstetrics & Gynecology

## 2014-08-05 DIAGNOSIS — I10 Essential (primary) hypertension: Secondary | ICD-10-CM | POA: Insufficient documentation

## 2014-08-05 DIAGNOSIS — A5402 Gonococcal vulvovaginitis, unspecified: Secondary | ICD-10-CM | POA: Insufficient documentation

## 2014-08-05 DIAGNOSIS — A549 Gonococcal infection, unspecified: Secondary | ICD-10-CM

## 2014-08-05 DIAGNOSIS — K219 Gastro-esophageal reflux disease without esophagitis: Secondary | ICD-10-CM | POA: Insufficient documentation

## 2014-08-05 MED ORDER — CEFTRIAXONE SODIUM 250 MG IJ SOLR
250.0000 mg | Freq: Once | INTRAMUSCULAR | Status: AC
  Administered 2014-08-05: 250 mg via INTRAMUSCULAR
  Filled 2014-08-05: qty 250

## 2014-08-05 MED ORDER — VERAPAMIL HCL ER 180 MG PO TBCR: 180.0000 mg | EXTENDED_RELEASE_TABLET | Freq: Two times a day (BID) | ORAL | Status: DC

## 2014-08-05 NOTE — MAU Note (Signed)
+  GC culture, is unable to get to clinic during office hours, here for treatment.

## 2014-08-05 NOTE — MAU Note (Signed)
Contacted on cell phone.  Reviewed d/c instructions.  Reinforced partner treatment, safe sex.  No intercourse for 1 wk after BOTH have been treated

## 2014-08-05 NOTE — MAU Provider Note (Signed)
Attestation of Attending Supervision of Advanced Practitioner (PA/CNM/NP): Evaluation and management procedures were performed by the Advanced Practitioner under my supervision and collaboration.  I have reviewed the Advanced Practitioner's note and chart, and I agree with the management and plan.  Tina Gruner, DO Attending Physician Faculty Practice, [facility]  

## 2014-08-05 NOTE — Discharge Instructions (Signed)
Gonorrhea Gonorrhea is an infection that can cause serious problems. If left untreated, the infection may:   Damage the female or female organs.   Cause women to be unable to have children (sterility).   Harm a fetus if the infected woman is pregnant.  It is important to get treatment for gonorrhea as soon as possible. It is also necessary that all your sexual partners be tested for the infection.  CAUSES  Gonorrhea is caused by bacteria called Neisseria gonorrhoeae. The infection is spread from person to person, usually by sexual contact (such as by anal, vaginal, or oral means). A newborn can contract the infection from his or her mother during birth.  SYMPTOMS  Some people with gonorrhea do not have symptoms. Symptoms may be different in females and males.  Females The most common symptoms are:   Pain in the lower abdomen.   Fever with or without chills.  Other symptoms include:   Abnormal vaginal discharge.   Painful intercourse.   Burning or itching of the vagina or lips of the vagina.   Abnormal vaginal bleeding.   Pain when urinating.   Long-lasting (chronic) pain in the lower abdomen, especially during menstruation or intercourse.   Inability to become pregnant.   Going into premature labor.   Irritation, pain, bleeding, or discharge from the rectum. This may occur if the infection was spread by anal sex.   Sore throat or swollen lymph nodes in the neck. This may occur if the infection was spread by oral sex.  Males The most common symptoms are:   Discharge from the penis.   Pain or burning during urination.   Pain or swelling in the testicles. Other symptoms may include:   Irritation, pain, bleeding, or discharge from the rectum. This may occur if the infection was spread by anal sex.   Sore throat, fever, or swollen lymph nodes in the neck. This may occur if the infection was spread by oral sex.  DIAGNOSIS  A diagnosis is made after a  physical exam is done and a sample of discharge is examined under a microscope for the presence of the bacteria. The discharge may be taken from the urethra, cervix, throat, or rectum.  TREATMENT  Gonorrhea is treated with antibiotic medicines. It is important for treatment to begin as soon as possible. Early treatment may prevent some problems from developing.  HOME CARE INSTRUCTIONS   Take medicines only as directed by your health care provider.   Take your antibiotic medicine as directed by your health care provider. Finish the antibiotic even if you start to feel better. Incomplete treatment will put you at risk for continued infection.   Do not have sex until treatment is complete or as directed by your health care provider.   Keep all follow-up visits as directed by your health care provider.   Not all test results are available during your visit. If your test results are not back during the visit, make an appointment with your health care provider to find out the results. Do not assume everything is normal if you have not heard from your health care provider or the medical facility. It is your responsibility to get your test results.  If you test positive for gonorrhea, inform your recent sexual partners. They need to be checked for gonorrhea even if they do not have symptoms. They may need treatment, even if they test negative for gonorrhea.  SEEK MEDICAL CARE IF:   You develop any bad reaction   to the medicine you were prescribed. This may include:   A rash.   Nausea.   Vomiting.   Diarrhea.   Your symptoms do not improve after a few days of taking antibiotics.   Your symptoms get worse.   You develop increased pain, such as in the testicles (for males) or in the abdomen (for females).  You have a fever. MAKE SURE YOU:   Understand these instructions.  Will watch your condition.  Will get help right away if you are not doing well or get worse. Document  Released: 09/23/2000 Document Revised: 02/10/2014 Document Reviewed: 04/03/2013 ExitCare Patient Information 2015 [employer]. This information is not intended to replace advice given to you by your health care provider. Make sure you discuss any questions you have with your health care provider.  

## 2014-08-05 NOTE — Telephone Encounter (Signed)
Message copied by Mallie Darting on Tue Aug 05, 2014 12:50 PM ------      Message from: Tarry Kos      Created: Tue Aug 05, 2014  8:03 AM       Telephone call to patient regarding positive GC culture, patient notified.  Patient has not been treated and will schedule treatment appointment with [facility].  Instructed patient to notify her partner for treatment.  Report faxed to health department. ------

## 2014-08-05 NOTE — Telephone Encounter (Signed)
Called patient for her to come in to get her injection. Patient stated she works from [place] to 5pm Monday through Friday, and wasn't going to be able to make it. She wanted to know if she could go to the emergency room to get it done. After speaking to [facility] RN, I informed patient she could go to MAU. However, we don't recommend using them as a Dr. Gabriel Carina. Patient stated she understood, but would not be able to come in before 5pm.

## 2014-08-05 NOTE — MAU Provider Note (Signed)
Chief Complaint: injection only   SUBJECTIVE HPI: Tammie [name] is a 38 y.o. G13P2002 female who presents for Rocephin injection. She was seen in maternity admissions 08/03/2014. Gonorrhea culture positive. Unable to get treated during office hours because of work schedule.  Past Medical History  Diagnosis Date  . Hypertension   . Fibroid   . Anemia   . Abnormal Pap smear   . Hypopotassemia   . Esophageal reflux   . Palpitations   . Pregnancy induced hypertension   . Dysrhythmia     pvc's  . Asthma     as child  . Obesity    OB History  Gravida Para Term Preterm AB SAB TAB Ectopic Multiple Living  2 2 2       2     # Outcome Date GA Lbr Len/2nd Weight Sex Delivery Anes PTL Lv  2 TRM 04/18/99 [redacted]w[redacted]d  3.629 kg (8 lb) M SVD     1 TRM 04/12/95 [redacted]w[redacted]d  1.814 kg (4 lb) M SVD        Past Surgical History  Procedure Laterality Date  . Tubal ligation    . Colposcopy    . Oophorectomy      Left   History   Social History  . Marital Status: Single    Spouse Name: N/A    Number of Children: N/A  . Years of Education: N/A   Occupational History  . Not on file.   Social History Main Topics  . Smoking status: Never Smoker   . Smokeless tobacco: Never Used  . Alcohol Use: No  . Drug Use: No  . Sexual Activity: Yes    Birth Control/ Protection: Condom, Surgical   Other Topics Concern  . Not on file   Social History Narrative  . No narrative on file   No current facility-administered medications on file prior to encounter.   Current Outpatient Prescriptions on File Prior to Encounter  Medication Sig Dispense Refill  . albuterol (PROVENTIL HFA;VENTOLIN HFA) 108 (90 BASE) MCG/ACT inhaler Inhale 1-2 puffs into the lungs every 6 (six) hours as needed for wheezing or shortness of breath.      . potassium chloride SA (K-DUR,KLOR-CON) 20 MEQ tablet Take 20 mEq by mouth 2 (two) times daily.      Marland Kitchen spironolactone (ALDACTONE) 50 MG tablet Take 1 tablet (50 mg total) by mouth  daily.  30 tablet  0  . verapamil (CALAN-SR) 180 MG CR tablet Take 1 tablet (180 mg total) by mouth 2 (two) times daily.  60 tablet  0   Allergies  Allergen Reactions  . Sulfa Antibiotics Rash and Other (See Comments)    "Burning"    ROS: Pertinent items in HPI  OBJECTIVE Last menstrual period 07/27/2014. GENERAL: Well-developed, well-nourished female in no acute distress.  HEENT: Normocephalic HEART: normal rate RESP: normal effort  LAB RESULTS No results found for this or any previous visit (from the past 24 hour(s)).  IMAGING No results found.  MAU COURSE Rocephin given.  ASSESSMENT 1. Gonorrhea in female     PLAN Discharge home in stable condition. Left before seen by CNM Partner needs treatment. No intercourse until one week after both patient and partner treated. Always use condoms.     Follow-up Information   Follow up with Gynecologist. (for routine care or as needed if symptoms worsen)       Follow up with [facility]. (For fever greater than 100.4 or severe  abdominal pain.)    Contact information:   [address] [id number] [place] [address] [phone]       Medication List         albuterol 108 (90 BASE) MCG/ACT inhaler  Commonly known as:  PROVENTIL HFA;VENTOLIN HFA  Inhale 1-2 puffs into the lungs every 6 (six) hours as needed for wheezing or shortness of breath.     potassium chloride SA 20 MEQ tablet  Commonly known as:  K-DUR,KLOR-CON  Take 20 mEq by mouth 2 (two) times daily.     spironolactone 50 MG tablet  Commonly known as:  ALDACTONE  Take 1 tablet (50 mg total) by mouth daily.     verapamil 180 MG CR tablet  Commonly known as:  CALAN-SR  Take 1 tablet (180 mg total) by mouth 2 (two) times daily.       [place], [place] 08/05/2014  5:58 PM

## 2014-08-05 NOTE — MAU Note (Signed)
Not in lobby, attempted to reach by phone

## 2014-08-07 ENCOUNTER — Encounter: Payer: Self-pay | Admitting: Family Medicine

## 2014-08-07 ENCOUNTER — Telehealth: Payer: Self-pay

## 2014-08-07 DIAGNOSIS — A549 Gonococcal infection, unspecified: Secondary | ICD-10-CM

## 2014-08-07 MED ORDER — AZITHROMYCIN 500 MG PO TABS: 1000.0000 mg | ORAL_TABLET | Freq: Once | ORAL | Status: DC

## 2014-08-07 NOTE — Telephone Encounter (Signed)
Patient called and left message stating her name and number. Called patient back and informed her of medication sent to her pharmacy. Patient verbalized understanding and states that she would like an appt to come in and be checked for this again because this makes her nervous/anxious. Told patient that we normally don't re-screen until a month after treatment and that we wouldn't have an appt before then anyway and that I will let our front office know that she needs an appt and they will reach her with an appt within a week. Patient verbalized understanding and had no other questions

## 2014-08-07 NOTE — Telephone Encounter (Signed)
Patient called stating she was seen in MAU and treated for GC with injection one day ago. States she received a call from the health department and they stated she need to have a RX pill form called into her pharmacy. Attempted to contact patient. No answer. Left message stating we are returning your call, please call clinic. Per protocol when being treated for GC give Rocephin as well as 1gm Zithromax. Per chart review, 1gm Zithromax not administered while patient here in MAU.  Zithromax 1gm e-prescribed to patient's pharmacy per protocol.

## 2014-08-11 ENCOUNTER — Encounter (HOSPITAL_COMMUNITY): Payer: Self-pay | Admitting: *Deleted

## 2014-08-22 ENCOUNTER — Encounter: Payer: Self-pay | Admitting: Physician Assistant

## 2014-08-22 ENCOUNTER — Ambulatory Visit (INDEPENDENT_AMBULATORY_CARE_PROVIDER_SITE_OTHER): Payer: Medicaid Other | Admitting: Physician Assistant

## 2014-08-22 VITALS — BP 150/84 | HR 85 | Ht 69.5 in | Wt 288.8 lb

## 2014-08-22 DIAGNOSIS — I10 Essential (primary) hypertension: Secondary | ICD-10-CM

## 2014-08-22 DIAGNOSIS — R0683 Snoring: Secondary | ICD-10-CM

## 2014-08-22 DIAGNOSIS — Z01810 Encounter for preprocedural cardiovascular examination: Secondary | ICD-10-CM

## 2014-08-22 MED ORDER — SPIRONOLACTONE 50 MG PO TABS: 50.0000 mg | ORAL_TABLET | Freq: Every day | ORAL | Status: DC

## 2014-08-22 MED ORDER — VERAPAMIL HCL ER 180 MG PO TBCR: 180.0000 mg | EXTENDED_RELEASE_TABLET | Freq: Two times a day (BID) | ORAL | Status: DC

## 2014-08-22 MED ORDER — POTASSIUM CHLORIDE CRYS ER 20 MEQ PO TBCR: 20.0000 meq | EXTENDED_RELEASE_TABLET | Freq: Two times a day (BID) | ORAL | Status: DC

## 2014-08-22 NOTE — Progress Notes (Signed)
Cardiology Office Note   Date:  08/22/2014   ID:  SKYLENE DEREMER, DOB Aug 13, 1976, MRN [id number]  PCP:  No PCP Per Patient  Cardiologist:  Truitt Merle, NP     History of Present Illness: Tammie [name] is a 38 y.o. female with a hx of HTN, morbid obesity, PVCs.  Previously followed by Dr. Jenell Milliner.  Last seen by Cecille Rubin 06/2013.  She returns for FU.   She has been seeing a Psychologist, sport and exercise at [facility].  She is preparing for bariatric surgery.  She needs surgical clearance. She work 13 hours a day.  She also walks 1-2 miles a day sometimes walks on a treadmill. She had a visit to the ED in July for chest pain.  Her aunt had recently died and she was under a lot of stress. She denies chest pain since that time.  She can vacuum a room or go up steps without chest pain.  She denies significant dyspnea.  She denies orthopnea, PND, edema.  She does snore.  She tells me that her children have witnessed apneic episodes.  She denies syncope.    Recent Labs/Images:  04/22/2014: BUN 13; Creatinine 0.79; Hemoglobin 11.3*; Potassium 4.2; Sodium 138     Wt Readings from Last 3 Encounters:  08/03/14 291 lb 12.8 oz (132.36 kg)  04/22/14 290 lb 1.6 oz (131.588 kg)  03/05/14 282 lb (127.914 kg)     Past Medical History  Diagnosis Date  . Hypertension   . Fibroid   . Anemia   . Abnormal Pap smear   . Hypopotassemia   . Esophageal reflux   . Palpitations   . Pregnancy induced hypertension   . Dysrhythmia     pvc's  . Asthma     as child  . Obesity     Current Outpatient Prescriptions  Medication Sig Dispense Refill  . albuterol (PROVENTIL HFA;VENTOLIN HFA) 108 (90 BASE) MCG/ACT inhaler Inhale 1-2 puffs into the lungs every 6 (six) hours as needed for wheezing or shortness of breath.    Marland Kitchen azithromycin (ZITHROMAX) 500 MG tablet Take 2 tablets (1,000 mg total) by mouth once. 2 tablet 0  . potassium chloride SA (K-DUR,KLOR-CON) 20 MEQ tablet Take 20 mEq by mouth 2 (two) times daily.      Marland Kitchen spironolactone (ALDACTONE) 50 MG tablet Take 1 tablet (50 mg total) by mouth daily. 30 tablet 0  . verapamil (CALAN-SR) 180 MG CR tablet Take 1 tablet (180 mg total) by mouth 2 (two) times daily. 60 tablet 0   No current facility-administered medications for this visit.     Allergies:   Sulfa antibiotics   Social History:  The patient  reports that she has never smoked. She has never used smokeless tobacco. She reports that she does not drink alcohol or use illicit drugs.   Family History:  The patient's family history includes Cancer in her father; Diabetes in her father and maternal grandmother; Hypertension in her father and paternal grandmother; Kidney disease in her father.   ROS:  Please see the history of present illness.      All other systems reviewed and negative.    PHYSICAL EXAM: VS:  BP 150/84 mmHg  Pulse 85  Ht 5' 9.5" (1.765 m)  Wt 288 lb 12.8 oz (130.999 kg)  BMI 42.05 kg/m2  LMP 07/27/2014 Well nourished, well developed, in no acute distress HEENT: normal Neck: no JVD Cardiac:  normal S1, S2;  RRR; no murmur   Lungs:  clear to auscultation bilaterally, no wheezing, rhonchi or rales Abd: soft, nontender, no hepatomegaly Ext: no edema Skin: warm and dry Neuro:  CNs 2-12 intact, no focal abnormalities noted  EKG:  NSR, HR 85, normal axis, no ST changes      ASSESSMENT AND PLAN:  1.  HYPERTENSION, BENIGN:  She works at dialysis.  She has her BP checked often and it runs ~ 130/80.  Continue current rx.  BMET in 7/15 with good K+ and creatinine.  2.  Pre-operative cardiovascular examination:  The patient does not have any unstable cardiac conditions.  She can achieve 4 METs or greater without anginal symptoms.  According to [facility] and AHA guidelines, she requires no further cardiac workup prior to her noncardiac surgery.  The patient should be at acceptable risk.    3.  OBESITY-MORBID (>100'):  As noted, she is pursuing bariatric surgery. 4.  Snoring:  She likely  has OSA.  She tells me that she is getting set up for a sleep study at [facility] in preparation for her surgery.   Disposition:   FU with Truitt Merle, NP 1 year.   Signed, Versie Starks, MHS 08/22/2014 8:44 AM    [facility] [address], [place], [place]  [address] Phone: [phone]; Fax: [phone]

## 2014-08-22 NOTE — Patient Instructions (Signed)
REFILLS HAVE BEEN SENT IN TODAY FOR  1. POTASSIUM 2. SPIRONOLACTONE 3. VERAPAMIL  Your physician wants you to follow-up in: [place], NP You will receive a reminder letter in the mail two months in advance. If you don't receive a letter, please call our office to schedule the follow-up appointment.

## 2014-09-25 ENCOUNTER — Telehealth: Payer: Self-pay | Admitting: *Deleted

## 2014-09-25 ENCOUNTER — Encounter: Payer: Self-pay | Admitting: *Deleted

## 2014-09-25 ENCOUNTER — Ambulatory Visit: Payer: Medicaid Other | Admitting: Family Medicine

## 2014-09-25 NOTE — Telephone Encounter (Signed)
Tammie [name] missed an appointment scheduled today for std testing. Called and left a message on her voicemail that she missed an appointment- please call to reschedule. Will also send letter.

## 2014-11-28 ENCOUNTER — Other Ambulatory Visit: Payer: Self-pay | Admitting: Nurse Practitioner

## 2015-01-01 ENCOUNTER — Encounter: Payer: Self-pay | Admitting: Family Medicine

## 2015-01-01 ENCOUNTER — Telehealth: Payer: Self-pay | Admitting: General Practice

## 2015-01-01 NOTE — Telephone Encounter (Signed)
Patient called and left message stating she needs to schedule a yearly exam. Called patient back and told her that I was unable to make that appt for her but I will send a message to our front office staff and they will call her to schedule that appt. Patient verbalized understanding and had no other questions

## 2015-02-02 ENCOUNTER — Encounter: Payer: Self-pay | Admitting: Family Medicine

## 2015-02-02 ENCOUNTER — Encounter: Payer: Self-pay | Admitting: *Deleted

## 2015-02-02 ENCOUNTER — Ambulatory Visit (INDEPENDENT_AMBULATORY_CARE_PROVIDER_SITE_OTHER): Payer: Medicaid Other | Admitting: Family Medicine

## 2015-02-02 VITALS — BP 155/72 | HR 90 | Ht 69.5 in | Wt 294.8 lb

## 2015-02-02 DIAGNOSIS — Z0000 Encounter for general adult medical examination without abnormal findings: Secondary | ICD-10-CM

## 2015-02-02 DIAGNOSIS — Z01419 Encounter for gynecological examination (general) (routine) without abnormal findings: Secondary | ICD-10-CM | POA: Diagnosis not present

## 2015-02-02 DIAGNOSIS — N920 Excessive and frequent menstruation with regular cycle: Secondary | ICD-10-CM

## 2015-02-02 DIAGNOSIS — Z202 Contact with and (suspected) exposure to infections with a predominantly sexual mode of transmission: Secondary | ICD-10-CM

## 2015-02-02 LAB — CBC
HCT: 34.4 % — ABNORMAL LOW (ref 36.0–46.0)
HEMOGLOBIN: 11.1 g/dL — AB (ref 12.0–15.0)
MCH: 26.7 pg (ref 26.0–34.0)
MCHC: 32.3 g/dL (ref 30.0–36.0)
MCV: 82.9 fL (ref 78.0–100.0)
MPV: 10.3 fL (ref 8.6–12.4)
Platelets: 369 10*3/uL (ref 150–400)
RBC: 4.15 MIL/uL (ref 3.87–5.11)
RDW: 15.3 % (ref 11.5–15.5)
WBC: 11.5 10*3/uL — AB (ref 4.0–10.5)

## 2015-02-02 NOTE — Addendum Note (Signed)
Addended by: Rutherford Nail E on: 02/02/2015 04:40 PM   Modules accepted: Orders

## 2015-02-02 NOTE — Progress Notes (Signed)
Pre authorization number for ultrasound: [id number]

## 2015-02-02 NOTE — Progress Notes (Signed)
  Subjective:     Tammie [name] is a 39 y.o. female and is here for a comprehensive physical exam. The patient reports exposure to STD.  Would like HIV, Hep C, and GC/CT check.  No symptoms.  Does have menorrhagia with regular 28-30 day cycles.  Heavy bleeding/clots x2-3 days.  History   Social History  . Marital Status: Single    Spouse Name: N/A  . Number of Children: N/A  . Years of Education: N/A   Occupational History  . Not on file.   Social History Main Topics  . Smoking status: Never Smoker   . Smokeless tobacco: Never Used  . Alcohol Use: No  . Drug Use: No  . Sexual Activity: Yes    Birth Control/ Protection: Condom, Surgical   Other Topics Concern  . Not on file   Social History Narrative   Health Maintenance  Topic Date Due  . Samul Dada  11/15/1996  . INFLUENZA VACCINE  05/11/2015  . PAP SMEAR  12/28/2015  . HIV Screening  Completed    The following portions of the patient's history were reviewed and updated as appropriate: allergies, current medications, past family history, past medical history, past social history, past surgical history and problem list.  Review of Systems Pertinent items are noted in HPI.   Objective:    BP 155/72 mmHg  Pulse 90  Ht 5' 9.5" (1.765 m)  Wt 294 lb 12.8 oz (133.72 kg)  BMI 42.92 kg/m2  LMP 01/26/2015 (Exact Date) General appearance: alert, cooperative and no distress Neck: no adenopathy, no carotid bruit, no JVD, supple, symmetrical, trachea midline and thyroid not enlarged, symmetric, no tenderness/mass/nodules Lungs: clear to auscultation bilaterally Breasts: normal appearance, no masses or tenderness Heart: regular rate and rhythm, S1, S2 normal, no murmur, click, rub or gallop Abdomen: soft, non-tender; bowel sounds normal; no masses,  no organomegaly Pelvic: cervix normal in appearance, external genitalia normal, no adnexal masses or tenderness, no cervical motion tenderness, rectovaginal septum normal,  uterus normal size, shape, and consistency and vagina normal without discharge Extremities: extremities normal, atraumatic, no cyanosis or edema Pulses: 2+ and symmetric    Assessment:    Healthy female exam. Menorrhagia, STD exposure.      Plan:    1.  PAP not done - due in 1 year 2.  Hep C, GC/CT, HIV done today 3.  Will get [place] for menorrhagia - has history of fibroids and is s/o myomectomy.  See After Visit Summary for Counseling Recommendations

## 2015-02-03 LAB — GC/CHLAMYDIA PROBE AMP
CT Probe RNA: NEGATIVE
GC PROBE AMP APTIMA: NEGATIVE

## 2015-02-03 LAB — HIV ANTIBODY (ROUTINE TESTING W REFLEX): HIV: NONREACTIVE

## 2015-02-03 LAB — HEPATITIS C ANTIBODY: HCV AB: NEGATIVE

## 2015-02-04 ENCOUNTER — Telehealth: Payer: Self-pay | Admitting: *Deleted

## 2015-02-04 NOTE — Telephone Encounter (Signed)
Contacted patient, results given,, pt verbalizes understanding and has no further questions.

## 2015-02-04 NOTE — Telephone Encounter (Signed)
-----   Message from Truett Mainland, DO sent at 02/03/2015 10:30 PM EDT ----- Her GC/CT, HIV, and hepatitis labs were all negative.  Please let pt know.  Thanks!

## 2015-02-11 ENCOUNTER — Ambulatory Visit (HOSPITAL_COMMUNITY): Admission: RE | Admit: 2015-02-11 | Payer: Medicaid Other | Source: Ambulatory Visit

## 2015-03-17 ENCOUNTER — Other Ambulatory Visit: Payer: Self-pay

## 2015-03-17 DIAGNOSIS — I10 Essential (primary) hypertension: Secondary | ICD-10-CM

## 2015-03-18 ENCOUNTER — Other Ambulatory Visit: Payer: Self-pay | Admitting: Physician Assistant

## 2015-03-18 DIAGNOSIS — I10 Essential (primary) hypertension: Secondary | ICD-10-CM

## 2015-03-18 MED ORDER — POTASSIUM CHLORIDE CRYS ER 20 MEQ PO TBCR: 20.0000 meq | EXTENDED_RELEASE_TABLET | Freq: Two times a day (BID) | ORAL | Status: DC

## 2015-03-18 MED ORDER — VERAPAMIL HCL ER 180 MG PO TBCR: 180.0000 mg | EXTENDED_RELEASE_TABLET | Freq: Two times a day (BID) | ORAL | Status: DC

## 2015-03-20 MED ORDER — SPIRONOLACTONE 50 MG PO TABS: 50.0000 mg | ORAL_TABLET | Freq: Every day | ORAL | Status: DC

## 2015-06-11 HISTORY — PX: LAPAROSCOPIC GASTRIC SLEEVE RESECTION: SHX5895

## 2015-06-13 ENCOUNTER — Emergency Department (HOSPITAL_COMMUNITY): Payer: BLUE CROSS/BLUE SHIELD

## 2015-06-13 ENCOUNTER — Encounter (HOSPITAL_COMMUNITY): Payer: Self-pay | Admitting: Emergency Medicine

## 2015-06-13 ENCOUNTER — Emergency Department (HOSPITAL_COMMUNITY)
Admission: EM | Admit: 2015-06-13 | Discharge: 2015-06-13 | Disposition: A | Discharge status: Home / Self Care | Payer: BLUE CROSS/BLUE SHIELD | Attending: Emergency Medicine | Admitting: Emergency Medicine

## 2015-06-13 DIAGNOSIS — E876 Hypokalemia: Secondary | ICD-10-CM | POA: Diagnosis not present

## 2015-06-13 DIAGNOSIS — Z862 Personal history of diseases of the blood and blood-forming organs and certain disorders involving the immune mechanism: Secondary | ICD-10-CM | POA: Diagnosis not present

## 2015-06-13 DIAGNOSIS — K219 Gastro-esophageal reflux disease without esophagitis: Secondary | ICD-10-CM | POA: Diagnosis not present

## 2015-06-13 DIAGNOSIS — I10 Essential (primary) hypertension: Secondary | ICD-10-CM | POA: Diagnosis not present

## 2015-06-13 DIAGNOSIS — R000 Tachycardia, unspecified: Secondary | ICD-10-CM | POA: Diagnosis not present

## 2015-06-13 DIAGNOSIS — J45909 Unspecified asthma, uncomplicated: Secondary | ICD-10-CM | POA: Diagnosis not present

## 2015-06-13 DIAGNOSIS — Z880 Allergy status to penicillin: Secondary | ICD-10-CM | POA: Diagnosis not present

## 2015-06-13 DIAGNOSIS — E669 Obesity, unspecified: Secondary | ICD-10-CM | POA: Diagnosis not present

## 2015-06-13 DIAGNOSIS — Z79899 Other long term (current) drug therapy: Secondary | ICD-10-CM | POA: Insufficient documentation

## 2015-06-13 DIAGNOSIS — Z3202 Encounter for pregnancy test, result negative: Secondary | ICD-10-CM | POA: Diagnosis not present

## 2015-06-13 DIAGNOSIS — K5641 Fecal impaction: Secondary | ICD-10-CM | POA: Diagnosis not present

## 2015-06-13 DIAGNOSIS — R109 Unspecified abdominal pain: Secondary | ICD-10-CM

## 2015-06-13 DIAGNOSIS — Z86018 Personal history of other benign neoplasm: Secondary | ICD-10-CM | POA: Diagnosis not present

## 2015-06-13 DIAGNOSIS — Z9851 Tubal ligation status: Secondary | ICD-10-CM | POA: Diagnosis not present

## 2015-06-13 LAB — I-STAT BETA HCG BLOOD, ED (MC, WL, AP ONLY): I-stat hCG, quantitative: <5 m[IU]/mL (ref <5)

## 2015-06-13 LAB — CBC
HEMATOCRIT: 35.2 % — AB (ref 36.0–46.0)
Hemoglobin: 11 g/dL — ABNORMAL LOW (ref 12.0–15.0)
MCH: 26.4 pg (ref 26.0–34.0)
MCHC: 31.3 g/dL (ref 30.0–36.0)
MCV: 84.4 fL (ref 78.0–100.0)
Platelets: 277 10*3/uL (ref 150–400)
RBC: 4.17 MIL/uL (ref 3.87–5.11)
RDW: 14.6 % (ref 11.5–15.5)
WBC: 5.6 10*3/uL (ref 4.0–10.5)

## 2015-06-13 LAB — COMPREHENSIVE METABOLIC PANEL
ALBUMIN: 3.6 g/dL (ref 3.5–5.0)
ALT: 17 U/L (ref 14–54)
AST: 19 U/L (ref 15–41)
Alkaline Phosphatase: 47 U/L (ref 38–126)
Anion gap: 8 (ref 5–15)
BUN: 6 mg/dL (ref 6–20)
CHLORIDE: 104 mmol/L (ref 101–111)
CO2: 26 mmol/L (ref 22–32)
Calcium: 9 mg/dL (ref 8.9–10.3)
Creatinine, Ser: 0.82 mg/dL (ref 0.44–1.00)
GFR calc Af Amer: >60 mL/min (ref >60)
GLUCOSE: 94 mg/dL (ref 65–99)
POTASSIUM: 3.8 mmol/L (ref 3.5–5.1)
Sodium: 138 mmol/L (ref 135–145)
Total Bilirubin: 0.5 mg/dL (ref 0.3–1.2)
Total Protein: 7.9 g/dL (ref 6.5–8.1)

## 2015-06-13 LAB — URINALYSIS, ROUTINE W REFLEX MICROSCOPIC
Bilirubin Urine: NEGATIVE
GLUCOSE, UA: NEGATIVE mg/dL
Hgb urine dipstick: NEGATIVE
Ketones, ur: >80 mg/dL — AB
LEUKOCYTES UA: NEGATIVE
Nitrite: NEGATIVE
PROTEIN: NEGATIVE mg/dL
Specific Gravity, Urine: 1.024 (ref 1.005–1.030)
Urobilinogen, UA: 1 mg/dL (ref 0.0–1.0)
pH: 8 (ref 5.0–8.0)

## 2015-06-13 LAB — LIPASE, BLOOD: LIPASE: 23 U/L (ref 22–51)

## 2015-06-13 LAB — POC URINE PREG, ED: PREG TEST UR: NEGATIVE

## 2015-06-13 MED ORDER — MORPHINE SULFATE (PF) 2 MG/ML IV SOLN
2.0000 mg | Freq: Once | INTRAVENOUS | Status: AC
  Administered 2015-06-13: 2 mg via INTRAVENOUS
  Filled 2015-06-13: qty 1

## 2015-06-13 MED ORDER — IOHEXOL 300 MG/ML  SOLN
100.0000 mL | Freq: Once | INTRAMUSCULAR | Status: AC | PRN
  Administered 2015-06-13: 100 mL via INTRAVENOUS

## 2015-06-13 MED ORDER — MORPHINE SULFATE (PF) 4 MG/ML IV SOLN
4.0000 mg | Freq: Once | INTRAVENOUS | Status: AC
  Administered 2015-06-13: 4 mg via INTRAVENOUS
  Filled 2015-06-13: qty 1

## 2015-06-13 MED ORDER — MILK AND MOLASSES ENEMA
1.0000 | Freq: Once | RECTAL | Status: AC
  Administered 2015-06-13: 250 mL via RECTAL
  Filled 2015-06-13: qty 250

## 2015-06-13 MED ORDER — SODIUM CHLORIDE 0.9 % IV BOLUS (SEPSIS)
1000.0000 mL | Freq: Once | INTRAVENOUS | Status: AC
  Administered 2015-06-13: 1000 mL via INTRAVENOUS

## 2015-06-13 MED ORDER — IOHEXOL 300 MG/ML  SOLN
50.0000 mL | Freq: Once | INTRAMUSCULAR | Status: AC | PRN
  Administered 2015-06-13: 50 mL via ORAL

## 2015-06-13 MED ORDER — HYDROMORPHONE HCL 1 MG/ML IJ SOLN
1.0000 mg | Freq: Once | INTRAMUSCULAR | Status: AC
  Administered 2015-06-13: 1 mg via INTRAVENOUS
  Filled 2015-06-13: qty 1

## 2015-06-13 MED ORDER — LUBIPROSTONE 24 MCG PO CAPS: 24.0000 ug | ORAL_CAPSULE | Freq: Two times a day (BID) | ORAL | Status: DC

## 2015-06-13 NOTE — ED Notes (Signed)
Pt returns from CT.

## 2015-06-13 NOTE — ED Notes (Signed)
Had gastric sleeve placed on 17th of last month, pt states she received a bag of fluids about a week after her surgery for dehydration in her doctor's office. States she hasn't had a BM since before the surgery last month, and is feeling a lot of pressure in her abdomin. States the phone triage nurse told her to come to the nearest hospital today for these symptoms. Denies N/V/D, fever/chills. States she had a very small BM last week, and hasn't been passing as much gas as normal.

## 2015-06-13 NOTE — ED Notes (Addendum)
IV start unsuccessful x1. Pt sts she is a hard stick and US guidance is usually required. 2nd RN requested to start [place] IV and collect labs.

## 2015-06-13 NOTE — Discharge Instructions (Signed)
1. Medications: amitiza, usual home medications 2. Treatment: rest, drink plenty of fluids, advance diet slowly 3. Follow Up: Please followup with your primary doctor in 2 days for discussion of your diagnoses and further evaluation after today's visit; if you do not have a primary care doctor use the resource guide provided to find one; Please return to the ER for persistent vomiting, high fevers or worsening symptoms  Constipation Constipation is when a person has fewer than three bowel movements a week, has difficulty having a bowel movement, or has stools that are dry, hard, or larger than normal. As people grow older, constipation is more common. If you try to fix constipation with medicines that make you have a bowel movement (laxatives), the problem may get worse. Long-term laxative use may cause the muscles of the colon to become weak. A low-fiber diet, not taking in enough fluids, and taking certain medicines may make constipation worse.  CAUSES   Certain medicines, such as antidepressants, pain medicine, iron supplements, antacids, and water pills.   Certain diseases, such as diabetes, irritable bowel syndrome (IBS), thyroid disease, or depression.   Not drinking enough water.   Not eating enough fiber-rich foods.   Stress or travel.   Lack of physical activity or exercise.   Ignoring the urge to have a bowel movement.   Using laxatives too much.  SIGNS AND SYMPTOMS   Having fewer than three bowel movements a week.   Straining to have a bowel movement.   Having stools that are hard, dry, or larger than normal.   Feeling full or bloated.   Pain in the lower abdomen.   Not feeling relief after having a bowel movement.  DIAGNOSIS  Your health care provider will take a medical history and perform a physical exam. Further testing may be done for severe constipation. Some tests may include:  A barium enema X-ray to examine your rectum, colon, and, sometimes,  your small intestine.   A sigmoidoscopy to examine your lower colon.   A colonoscopy to examine your entire colon. TREATMENT  Treatment will depend on the severity of your constipation and what is causing it. Some dietary treatments include drinking more fluids and eating more fiber-rich foods. Lifestyle treatments may include regular exercise. If these diet and lifestyle recommendations do not help, your health care provider may recommend taking over-the-counter laxative medicines to help you have bowel movements. Prescription medicines may be prescribed if over-the-counter medicines do not work.  HOME CARE INSTRUCTIONS   Eat foods that have a lot of fiber, such as fruits, vegetables, whole grains, and beans.  Limit foods high in fat and processed sugars, such as french fries, hamburgers, cookies, candies, and soda.   A fiber supplement may be added to your diet if you cannot get enough fiber from foods.   Drink enough fluids to keep your urine clear or pale yellow.   Exercise regularly or as directed by your health care provider.   Go to the restroom when you have the urge to go. Do not hold it.   Only take over-the-counter or prescription medicines as directed by your health care provider. Do not take other medicines for constipation without talking to your health care provider first.  [place] IF:   You have bright red blood in your stool.   Your constipation lasts for more than 4 days or gets worse.   You have abdominal or rectal pain.   You have thin, pencil-like stools.  You have unexplained weight loss. MAKE SURE YOU:   Understand these instructions.  Will watch your condition.  Will get help right away if you are not doing well or get worse. Document Released: 06/24/2004 Document Revised: 10/01/2013 Document Reviewed: 07/08/2013 [facility] Patient Information 2015 [place], [place]. This information is not intended to replace advice given  to you by your health care provider. Make sure you discuss any questions you have with your health care provider.

## 2015-06-13 NOTE — ED Notes (Signed)
Pt tolerated enema well but was only able to retain its contents for about a minute before she had to go to the restroom.  Now she reports minimal relief of her symptoms.  After discussion, she has decided that she does not want more pain medication at this time and instead prefers to attempt to use the restroom again independently.  If she is unsuccessful again, she states that she may need another disimpaction performed.  Pt is currently in the bathroom attempting to move her bowels.

## 2015-06-13 NOTE — ED Notes (Signed)
Jarrett Soho, ED PA at bedside attempting [place] IV start.

## 2015-06-13 NOTE — ED Notes (Signed)
WENT TO COLLECT SAMPLES ,RN WAS IN PROCESS OF STARTING IV AND THEY WOULD COLLECT SAMPLES.

## 2015-06-13 NOTE — ED Notes (Signed)
Pt in CT. Will start fluids once pt returns.

## 2015-06-13 NOTE — ED Provider Notes (Signed)
39 year old female signed out to me at shift change by Jarrett Soho Muthersbaugh PA-C pending enema. Patient presents with fecal impaction, she was digitally disimpacted by the previous provider. Patient had some success with the enema, feels less pressure in her rectum, requesting to go home. I feel patient is stable for discharge home. She is encouraged to use the Amitiza for constipation, monitor for new or worsening signs or symptoms, return immediately if any present. Patient was visibly improved, and agreed to today's assessment and plan and had no further questions at the time of discharge. Please see previous provider's note for full H&P.    Okey Regal, PA-C 06/14/15 [id number]  Evelina Bucy, MD 06/14/15 [phone]

## 2015-06-13 NOTE — ED Provider Notes (Signed)
CSN: [id number]     Arrival date & time 06/13/15  1223 History   First MD Initiated Contact with Patient 06/13/15 1244     Chief Complaint  Patient presents with  . Abdominal Pain  . Constipation     (Consider location/radiation/quality/duration/timing/severity/associated sxs/prior Treatment) The history is provided by the patient and medical records. No language interpreter was used.     Tammie [name] is a 39 y.o. female  with a hx of HTN, anemia, reflux, asthma, obesity s/p gastric sleeve at [facility] on 05/28/15 presents to the Emergency Department complaining of gradual, persistent, progressively worsening constipation onset just prior to surgery with associated abdominal pressure. She reports she has not had a bowel movement since before her surgery. She reports taking intermittent stool softeners without relief. She reports she has the urge to defecate but has been unable to do so. Patient endorses abdominal distention. She reports it spent several days since her last flatus.  She has not followed up with her surgeon yet. Nothing seems to make her symptoms better. Movement and palpation makes them worse. She reports she stopped taking her narcotic pain medicine 3 days ago as she was concerned this was the culprit for her pain.  Patient denies fever, chills, nausea, vomiting, diarrhea, weakness, dizziness, syncope.  Past Medical History  Diagnosis Date  . Hypertension   . Fibroid   . Anemia   . Abnormal Pap smear   . Hypopotassemia   . Esophageal reflux   . Palpitations   . Pregnancy induced hypertension   . Dysrhythmia     pvc's  . Asthma     as child  . Obesity    Past Surgical History  Procedure Laterality Date  . Tubal ligation    . Colposcopy    . Oophorectomy      Left  . Laparoscopic gastric sleeve resection     Family History  Problem Relation Age of Onset  . Hypertension Father   . Kidney disease Father   . Cancer Father   . Diabetes Father   . Diabetes  Maternal Grandmother   . Hypertension Paternal Grandmother    Social History  Substance Use Topics  . Smoking status: Never Smoker   . Smokeless tobacco: Never Used  . Alcohol Use: No   OB History    Gravida Para Term Preterm AB TAB SAB Ectopic Multiple Living   2 2 2       2      Review of Systems  Constitutional: Negative for fever, diaphoresis, appetite change, fatigue and unexpected weight change.  HENT: Negative for mouth sores.   Eyes: Negative for visual disturbance.  Respiratory: Negative for cough, chest tightness, shortness of breath and wheezing.   Cardiovascular: Negative for chest pain.  Gastrointestinal: Positive for abdominal pain, constipation and abdominal distention. Negative for nausea, vomiting and diarrhea.  Endocrine: Negative for polydipsia, polyphagia and polyuria.  Genitourinary: Negative for dysuria, urgency, frequency and hematuria.  Musculoskeletal: Negative for back pain and neck stiffness.  Skin: Negative for rash.  Allergic/Immunologic: Negative for immunocompromised state.  Neurological: Negative for syncope, light-headedness and headaches.  Hematological: Does not bruise/bleed easily.  Psychiatric/Behavioral: Negative for sleep disturbance. The patient is not nervous/anxious.       Allergies  Penicillins; Sulfamethoxazole; and Sulfa antibiotics  Home Medications   Prior to Admission medications   Medication Sig Start Date End Date Taking? Authorizing Provider  albuterol (PROVENTIL HFA;VENTOLIN HFA) 108 (90 BASE) MCG/ACT inhaler Inhale 1-2 puffs into the  lungs every 6 (six) hours as needed for wheezing or shortness of breath.   Yes Historical Provider, MD  Calcium Citrate-Vitamin D (CALCIUM + D PO) Take 1 tablet by mouth 2 (two) times daily.   Yes Historical Provider, MD  chlorhexidine (PERIDEX) 0.12 % solution 15 mLs by Mouth Rinse route 2 (two) times daily as needed (fish bone removal).  05/25/15  Yes Historical Provider, MD  Cyanocobalamin  (B-12 PO) Take 1 tablet by mouth daily.   Yes Historical Provider, MD  hyoscyamine (ANASPAZ) 0.125 MG TBDP disintergrating tablet Take 125 mcg by mouth 4 (four) times daily as needed for cramping.  05/28/15  Yes Historical Provider, MD  IRON PO Take 1 tablet by mouth daily.   Yes Historical Provider, MD  Multiple Vitamin (MULTIVITAMIN WITH MINERALS) TABS tablet Take 1 tablet by mouth 2 (two) times daily.   Yes Historical Provider, MD  oxyCODONE (ROXICODONE) 5 MG/5ML solution Take 10-15 mg by mouth every 4 (four) hours as needed for severe pain.  05/28/15  Yes Historical Provider, MD  promethazine (PHENERGAN) 25 MG tablet Take 25 mg by mouth every 6 (six) hours as needed for nausea.  05/28/15  Yes Historical Provider, MD  ranitidine (ZANTAC) 150 MG tablet Take 150 mg by mouth 2 (two) times daily as needed for heartburn.  05/28/15  Yes Historical Provider, MD  sennosides-docusate sodium (SENOKOT-S) 8.6-50 MG tablet Take 1 tablet by mouth 2 (two) times daily. 05/28/15  Yes Historical Provider, MD  triamcinolone cream (KENALOG) 0.1 % APPLY TO AFFECTED AREA(S) TWICE DAILY PRN BUG BITE 05/25/15  Yes Historical Provider, MD  verapamil (CALAN) 120 MG tablet Take 120 mg by mouth 3 (three) times daily.   Yes Historical Provider, MD  lubiprostone (AMITIZA) 24 MCG capsule Take 1 capsule (24 mcg total) by mouth 2 (two) times daily with a meal. 06/13/15   Lovie Zarling, PA-C  potassium chloride SA (K-DUR,KLOR-CON) 20 MEQ tablet Take 1 tablet (20 mEq total) by mouth 2 (two) times daily. Patient not taking: Reported on 06/13/2015 03/18/15   Liliane Shi, PA-C  spironolactone (ALDACTONE) 50 MG tablet Take 1 tablet (50 mg total) by mouth daily. Patient not taking: Reported on 06/13/2015 03/20/15   Liliane Shi, PA-C  ursodiol (ACTIGALL) 300 MG capsule Take 300 mg by mouth as directed. 1 capsule BID. Start 3 weeks after surgery and continue for 6 months 05/28/15   Historical Provider, MD  verapamil (CALAN-SR) 180 MG CR  tablet Take 1 tablet (180 mg total) by mouth 2 (two) times daily. Patient not taking: Reported on 06/13/2015 03/18/15   Liliane Shi, PA-C   BP 149/77 mmHg  Pulse 107  Temp(Src) 98.1 F (36.7 C) (Oral)  Resp 18  SpO2 98%  LMP 05/30/2015 Physical Exam  Constitutional: She appears well-developed and well-nourished. No distress.  Awake, alert, nontoxic appearance  HENT:  Head: Normocephalic and atraumatic.  Mouth/Throat: Oropharynx is clear and moist. No oropharyngeal exudate.  Eyes: Conjunctivae are normal. No scleral icterus.  Neck: Normal range of motion. Neck supple.  Cardiovascular: Regular rhythm, normal heart sounds and intact distal pulses.  Tachycardia present.   Pulses:      Radial pulses are 2+ on the right side, and 2+ on the left side.       Dorsalis pedis pulses are 2+ on the right side, and 2+ on the left side.  Pulmonary/Chest: Effort normal and breath sounds normal. No respiratory distress. She has no wheezes.  Equal chest expansion  Abdominal:  Soft. Bowel sounds are normal. She exhibits no distension and no mass. There is tenderness. There is guarding. There is no rebound.  Musculoskeletal: Normal range of motion. She exhibits no edema.  Neurological: She is alert.  Speech is clear and goal oriented Moves extremities without ataxia  Skin: Skin is warm and dry. She is not diaphoretic.  Psychiatric: She has a normal mood and affect.  Nursing note and vitals reviewed.   ED Course  Fecal disimpaction Date/Time: 06/13/2015 4:30 PM Performed by: Abigail Butts Authorized by: Abigail Butts Consent: Verbal consent obtained. Risks and benefits: risks, benefits and alternatives were discussed Consent given by: patient Patient understanding: patient states understanding of the procedure being performed Patient consent: the patient's understanding of the procedure matches consent given Procedure consent: procedure consent matches procedure scheduled Relevant  documents: relevant documents present and verified Site marked: the operative site was marked Required items: required blood products, implants, devices, and special equipment available Patient identity confirmed: verbally with patient and arm band Time out: Immediately prior to procedure a "time out" was called to verify the correct patient, procedure, equipment, support staff and site/side marked as required. Preparation: Patient was prepped and draped in the usual sterile fashion. Local anesthesia used: no Patient sedated: no Patient tolerance: Patient tolerated the procedure well with no immediate complications Comments: Disimpaction of a large portion of fair hard stool from the rectal vault   (including critical care time) Labs Review Labs Reviewed  CBC - Abnormal; Notable for the following:    Hemoglobin 11.0 (*)    HCT 35.2 (*)    All other components within normal limits  URINALYSIS, ROUTINE W REFLEX MICROSCOPIC (NOT AT [facility]) - Abnormal; Notable for the following:    APPearance CLOUDY (*)    Ketones, ur >80 (*)    All other components within normal limits  LIPASE, BLOOD  COMPREHENSIVE METABOLIC PANEL  URINE MICROSCOPIC-ADD ON  I-STAT BETA HCG BLOOD, ED ([facility], [facility], [facility] ONLY)  POC URINE PREG, ED    Imaging Review Ct Abdomen Pelvis W Contrast  06/13/2015   CLINICAL DATA:  Gastric sleeve August 17. No bowel movement since surgery.  EXAM: CT ABDOMEN AND PELVIS WITH CONTRAST  TECHNIQUE: Multidetector CT imaging of the abdomen and pelvis was performed using the standard protocol following bolus administration of intravenous contrast.  CONTRAST:  17mL OMNIPAQUE IOHEXOL 300 MG/ML SOLN, 90mL OMNIPAQUE IOHEXOL 300 MG/ML SOLN  COMPARISON:  09/08/2010  FINDINGS: Lower chest:  Lung bases are clear.  Normal heart size.  Hepatobiliary: Normal liver.  Normal gallbladder.  Pancreas: Normal pancreas.  Spleen: Normal spleen.  Adrenals/Urinary Tract: Normal adrenal glands. Normal kidneys. No  obstructive uropathy or nephrolithiasis. Normal bladder.  Stomach/Bowel: No bowel wall thickening or dilatation. Interval gastric sleeve procedure. No bowel obstruction. No pneumatosis, pneumoperitoneum or portal venous gas. Normal appendix. Moderate amount of stool throughout the colon. Rectal fecal impaction.  Vascular/Lymphatic: Normal caliber abdominal aorta. No abdominal or pelvic lymphadenopathy.  Reproductive: Normal uterus.  No adnexal mass.  Other: No fluid collection or hematoma. Postsurgical changes in the anterior abdominal subcutaneous fat from port placement subdural laparoscopy.  Musculoskeletal: No acute osseous abnormality. No lytic or sclerotic osseous lesion.  IMPRESSION: 1. Moderate amount of stool throughout the colon and rectal fecal impaction.   Electronically Signed   By: Kathreen Devoid   On: 06/13/2015 15:09   I have personally reviewed and evaluated these images and lab results as part of my medical decision-making.   EKG Interpretation None  Angiocath insertion Performed by: Abigail Butts  Consent: Verbal consent obtained. Risks and benefits: risks, benefits and alternatives were discussed Time out: Immediately prior to procedure a "time out" was called to verify the correct patient, procedure, equipment, support staff and site/side marked as required.  Preparation: Patient was prepped and draped in the usual sterile fashion.  Vein Location: right AC  Ultrasound Guided  Gauge: 20ga  Normal blood return and flush without difficulty Patient tolerance: Patient tolerated the procedure well with no immediate complications.     MDM   Final diagnoses:  Fecal impaction in rectum  Abdominal pain, unspecified abdominal location   [employer] presents with abd pain and constipation after gastric bypass.  Will begin work-up including CT abd.  3:30PM CT scan with moderate amount of stool throughout the colon and rectal fecal impaction. Patient  will need manual disimpaction.  Labs are otherwise reassuring. No evidence of internal hernia after gastric sleeve.  4:37 PM Rectal disimpaction with large amount of feces removed. Patient will receive an enema here in the emergency department. She may be discharged home after this. She will go home with amitiza for narcotic induced constipation.  BP 149/77 mmHg  Pulse 107  Temp(Src) 98.1 F (36.7 C) (Oral)  Resp 18  SpO2 98%  LMP 05/30/2015  Care transferred to [facility], PA-C who will follow her care.     Jarrett Soho Kynzee Devinney, PA-C 06/13/15 [place], MD 06/14/15 [phone]

## 2015-07-05 ENCOUNTER — Encounter (HOSPITAL_COMMUNITY): Payer: Self-pay | Admitting: Oncology

## 2015-07-05 ENCOUNTER — Emergency Department (HOSPITAL_COMMUNITY)
Admission: EM | Admit: 2015-07-05 | Discharge: 2015-07-05 | Disposition: A | Discharge status: Home / Self Care | Payer: BLUE CROSS/BLUE SHIELD | Attending: Emergency Medicine | Admitting: Emergency Medicine

## 2015-07-05 ENCOUNTER — Emergency Department (HOSPITAL_COMMUNITY): Payer: BLUE CROSS/BLUE SHIELD

## 2015-07-05 DIAGNOSIS — Z3202 Encounter for pregnancy test, result negative: Secondary | ICD-10-CM | POA: Insufficient documentation

## 2015-07-05 DIAGNOSIS — K219 Gastro-esophageal reflux disease without esophagitis: Secondary | ICD-10-CM | POA: Insufficient documentation

## 2015-07-05 DIAGNOSIS — Z862 Personal history of diseases of the blood and blood-forming organs and certain disorders involving the immune mechanism: Secondary | ICD-10-CM | POA: Diagnosis not present

## 2015-07-05 DIAGNOSIS — N946 Dysmenorrhea, unspecified: Secondary | ICD-10-CM | POA: Diagnosis not present

## 2015-07-05 DIAGNOSIS — E669 Obesity, unspecified: Secondary | ICD-10-CM | POA: Diagnosis not present

## 2015-07-05 DIAGNOSIS — N390 Urinary tract infection, site not specified: Secondary | ICD-10-CM | POA: Insufficient documentation

## 2015-07-05 DIAGNOSIS — J45909 Unspecified asthma, uncomplicated: Secondary | ICD-10-CM | POA: Diagnosis not present

## 2015-07-05 DIAGNOSIS — R1030 Lower abdominal pain, unspecified: Secondary | ICD-10-CM | POA: Diagnosis present

## 2015-07-05 DIAGNOSIS — Z86018 Personal history of other benign neoplasm: Secondary | ICD-10-CM | POA: Diagnosis not present

## 2015-07-05 DIAGNOSIS — E876 Hypokalemia: Secondary | ICD-10-CM | POA: Diagnosis not present

## 2015-07-05 DIAGNOSIS — Z79899 Other long term (current) drug therapy: Secondary | ICD-10-CM | POA: Insufficient documentation

## 2015-07-05 DIAGNOSIS — Z880 Allergy status to penicillin: Secondary | ICD-10-CM | POA: Diagnosis not present

## 2015-07-05 DIAGNOSIS — I10 Essential (primary) hypertension: Secondary | ICD-10-CM | POA: Diagnosis not present

## 2015-07-05 LAB — URINE MICROSCOPIC-ADD ON

## 2015-07-05 LAB — URINALYSIS, ROUTINE W REFLEX MICROSCOPIC
BILIRUBIN URINE: NEGATIVE
Glucose, UA: NEGATIVE mg/dL
KETONES UR: NEGATIVE mg/dL
NITRITE: NEGATIVE
PROTEIN: 30 mg/dL — AB
Specific Gravity, Urine: 1.026 (ref 1.005–1.030)
UROBILINOGEN UA: 1 mg/dL (ref 0.0–1.0)
pH: 6.5 (ref 5.0–8.0)

## 2015-07-05 LAB — COMPREHENSIVE METABOLIC PANEL
ALBUMIN: 3.8 g/dL (ref 3.5–5.0)
ALT: 18 U/L (ref 14–54)
ANION GAP: 9 (ref 5–15)
AST: 23 U/L (ref 15–41)
Alkaline Phosphatase: 55 U/L (ref 38–126)
BILIRUBIN TOTAL: 0.4 mg/dL (ref 0.3–1.2)
BUN: 11 mg/dL (ref 6–20)
CO2: 24 mmol/L (ref 22–32)
Calcium: 8.9 mg/dL (ref 8.9–10.3)
Chloride: 104 mmol/L (ref 101–111)
Creatinine, Ser: 0.69 mg/dL (ref 0.44–1.00)
GFR calc non Af Amer: >60 mL/min (ref >60)
GLUCOSE: 100 mg/dL — AB (ref 65–99)
POTASSIUM: 3.8 mmol/L (ref 3.5–5.1)
SODIUM: 137 mmol/L (ref 135–145)
TOTAL PROTEIN: 8.1 g/dL (ref 6.5–8.1)

## 2015-07-05 LAB — CBC
HEMATOCRIT: 36 % (ref 36.0–46.0)
HEMOGLOBIN: 10.9 g/dL — AB (ref 12.0–15.0)
MCH: 25.7 pg — ABNORMAL LOW (ref 26.0–34.0)
MCHC: 30.3 g/dL (ref 30.0–36.0)
MCV: 84.9 fL (ref 78.0–100.0)
Platelets: 282 10*3/uL (ref 150–400)
RBC: 4.24 MIL/uL (ref 3.87–5.11)
RDW: 15.3 % (ref 11.5–15.5)
WBC: 8 10*3/uL (ref 4.0–10.5)

## 2015-07-05 LAB — I-STAT BETA HCG BLOOD, ED (MC, WL, AP ONLY)

## 2015-07-05 LAB — LIPASE, BLOOD: Lipase: 25 U/L (ref 22–51)

## 2015-07-05 MED ORDER — NITROFURANTOIN MONOHYD MACRO 100 MG PO CAPS: 100.0000 mg | ORAL_CAPSULE | Freq: Two times a day (BID) | ORAL | Status: DC

## 2015-07-05 MED ORDER — MORPHINE SULFATE (PF) 4 MG/ML IV SOLN
4.0000 mg | Freq: Once | INTRAVENOUS | Status: AC
  Administered 2015-07-05: 4 mg via INTRAVENOUS
  Filled 2015-07-05: qty 1

## 2015-07-05 MED ORDER — NAPROXEN 375 MG PO TABS: 375.0000 mg | ORAL_TABLET | Freq: Two times a day (BID) | ORAL | Status: DC

## 2015-07-05 MED ORDER — ONDANSETRON HCL 4 MG/2ML IJ SOLN
4.0000 mg | Freq: Once | INTRAMUSCULAR | Status: AC
  Administered 2015-07-05: 4 mg via INTRAVENOUS
  Filled 2015-07-05: qty 2

## 2015-07-05 MED ORDER — KETOROLAC TROMETHAMINE 30 MG/ML IJ SOLN
30.0000 mg | Freq: Once | INTRAMUSCULAR | Status: AC
  Administered 2015-07-05: 30 mg via INTRAVENOUS
  Filled 2015-07-05: qty 1

## 2015-07-05 MED ORDER — FENTANYL CITRATE (PF) 100 MCG/2ML IJ SOLN
50.0000 ug | Freq: Once | INTRAMUSCULAR | Status: AC
  Administered 2015-07-05: 50 ug via INTRAVENOUS
  Filled 2015-07-05: qty 2

## 2015-07-05 MED ORDER — KETOROLAC TROMETHAMINE 10 MG PO TABS: 10.0000 mg | ORAL_TABLET | Freq: Four times a day (QID) | ORAL | Status: DC | PRN

## 2015-07-05 NOTE — ED Notes (Signed)
Pt had a gastric sleeve placed 6 weeks ago and presents tonight d/t severe abdominal pain.  Pt is unsure it the abdominal pain is r/t her upcoming menstrual cycle.

## 2015-07-05 NOTE — ED Provider Notes (Signed)
CSN: [id number]     Arrival date & time 07/05/15  0408 History   First MD Initiated Contact with Patient 07/05/15 0559     Chief Complaint  Patient presents with  . Abdominal Pain     (Consider location/radiation/quality/duration/timing/severity/associated sxs/prior Treatment) HPI Comments: Pt is a 39 yo female with PMH of HTN, reflux and gastric sleeve (done on 8/18 at [facility]) who presents to the ED with complaint of abdominal pain, onset yesterday morning. Pt reports having gradual onset lower abdominal pain that she describes as "sharp crampy" pain that radiates to her lower back. Denies aggravating or releiviating factors. Endorses nausea. She also reports she started her menstrual cycle yesterday afternoon and reports she has had similar pain with her periods in the past but not this severe of pain. She reports passing clots which she states is common with her menstrual cycle. Denies fever, HA, cough, SOB, CP, vomiting, diarrhea, constipation, urinary sxs, vaginal d/c. She notes she has used tylenol and ibuprofen at home without relief.   Past Medical History  Diagnosis Date  . Hypertension   . Fibroid   . Anemia   . Abnormal Pap smear   . Hypopotassemia   . Esophageal reflux   . Palpitations   . Pregnancy induced hypertension   . Dysrhythmia     pvc's  . Asthma     as child  . Obesity    Past Surgical History  Procedure Laterality Date  . Tubal ligation    . Colposcopy    . Oophorectomy      Left  . Laparoscopic gastric sleeve resection     Family History  Problem Relation Age of Onset  . Hypertension Father   . Kidney disease Father   . Cancer Father   . Diabetes Father   . Diabetes Maternal Grandmother   . Hypertension Paternal Grandmother    Social History  Substance Use Topics  . Smoking status: Never Smoker   . Smokeless tobacco: Never Used  . Alcohol Use: No   OB History    Gravida Para Term Preterm AB TAB SAB Ectopic Multiple Living   2 2 2       2      Review of Systems  Gastrointestinal: Positive for nausea and abdominal pain.  Genitourinary: Positive for vaginal bleeding.      Allergies  Penicillins; Sulfamethoxazole; and Sulfa antibiotics  Home Medications   Prior to Admission medications   Medication Sig Start Date End Date Taking? Authorizing Provider  albuterol (PROVENTIL HFA;VENTOLIN HFA) 108 (90 BASE) MCG/ACT inhaler Inhale 1-2 puffs into the lungs every 6 (six) hours as needed for wheezing or shortness of breath.   Yes Historical Provider, MD  Calcium Citrate-Vitamin D (CALCIUM + D PO) Take 1 tablet by mouth 2 (two) times daily.   Yes Historical Provider, MD  Cyanocobalamin (B-12 PO) Take 1 tablet by mouth daily.   Yes Historical Provider, MD  hyoscyamine (ANASPAZ) 0.125 MG TBDP disintergrating tablet Take 125 mcg by mouth 4 (four) times daily as needed for cramping.  05/28/15  Yes Historical Provider, MD  IRON PO Take 1 tablet by mouth daily.   Yes Historical Provider, MD  lubiprostone (AMITIZA) 24 MCG capsule Take 1 capsule (24 mcg total) by mouth 2 (two) times daily with a meal. 06/13/15  Yes Hannah Muthersbaugh, PA-C  Multiple Vitamin (MULTIVITAMIN WITH MINERALS) TABS tablet Take 1 tablet by mouth 2 (two) times daily.   Yes Historical Provider, MD  ranitidine (ZANTAC) 150 MG  tablet Take 150 mg by mouth 2 (two) times daily as needed for heartburn.  05/28/15  Yes Historical Provider, MD  sennosides-docusate sodium (SENOKOT-S) 8.6-50 MG tablet Take 1 tablet by mouth 2 (two) times daily. 05/28/15  Yes Historical Provider, MD  triamcinolone cream (KENALOG) 0.1 % APPLY TO AFFECTED AREA(S) TWICE DAILY PRN BUG BITE 05/25/15  Yes Historical Provider, MD  ursodiol (ACTIGALL) 300 MG capsule Take 300 mg by mouth 2 (two) times daily.  05/28/15  Yes Historical Provider, MD  verapamil (CALAN) 120 MG tablet Take 120 mg by mouth 3 (three) times daily.   Yes Historical Provider, MD  oxyCODONE (ROXICODONE) 5 MG/5ML solution Take 10-15 mg by mouth  every 4 (four) hours as needed for severe pain.  05/28/15   Historical Provider, MD  potassium chloride SA (K-DUR,KLOR-CON) 20 MEQ tablet Take 1 tablet (20 mEq total) by mouth 2 (two) times daily. Patient not taking: Reported on 06/13/2015 03/18/15   Liliane Shi, PA-C  promethazine (PHENERGAN) 25 MG tablet Take 25 mg by mouth every 6 (six) hours as needed for nausea.  05/28/15   Historical Provider, MD  spironolactone (ALDACTONE) 50 MG tablet Take 1 tablet (50 mg total) by mouth daily. Patient not taking: Reported on 06/13/2015 03/20/15   Liliane Shi, PA-C  verapamil (CALAN-SR) 180 MG CR tablet Take 1 tablet (180 mg total) by mouth 2 (two) times daily. Patient not taking: Reported on 06/13/2015 03/18/15   Liliane Shi, PA-C   BP 157/95 mmHg  Pulse 89  Temp(Src) 98.1 F (36.7 C) (Oral)  Resp 20  Ht 5\' 9"  (1.753 m)  Wt 265 lb (120.203 kg)  BMI 39.12 kg/m2  SpO2 98%  LMP 05/30/2015 Physical Exam  Constitutional: She is oriented to person, place, and time. She appears well-developed and well-nourished.  HENT:  Head: Normocephalic and atraumatic.  Mouth/Throat: Oropharynx is clear and moist. No oropharyngeal exudate.  Eyes: Conjunctivae and EOM are normal. Right eye exhibits no discharge. Left eye exhibits no discharge. No scleral icterus.  Neck: Normal range of motion. Neck supple.  Cardiovascular: Normal rate, regular rhythm, normal heart sounds and intact distal pulses.   Pulmonary/Chest: Effort normal and breath sounds normal. She has no wheezes. She has no rales. She exhibits no tenderness.  Abdominal: Soft. Bowel sounds are normal. She exhibits no distension and no mass. There is tenderness (Mildly TTP at suprapubic region, no peritoneal signs.). There is no rebound and no guarding.  Musculoskeletal: She exhibits no edema.  Lymphadenopathy:    She has no cervical adenopathy.  Neurological: She is alert and oriented to person, place, and time.  Skin: Skin is warm and dry.  Nursing note  and vitals reviewed.   ED Course  Procedures (including critical care time) Labs Review Labs Reviewed  COMPREHENSIVE METABOLIC PANEL - Abnormal; Notable for the following:    Glucose, Bld 100 (*)    All other components within normal limits  CBC - Abnormal; Notable for the following:    Hemoglobin 10.9 (*)    MCH 25.7 (*)    All other components within normal limits  URINALYSIS, ROUTINE W REFLEX MICROSCOPIC (NOT AT [facility]) - Abnormal; Notable for the following:    Color, Urine AMBER (*)    APPearance CLOUDY (*)    Hgb urine dipstick LARGE (*)    Protein, ur 30 (*)    Leukocytes, UA SMALL (*)    All other components within normal limits  URINE MICROSCOPIC-ADD ON - Abnormal; Notable for  the following:    Bacteria, UA FEW (*)    All other components within normal limits  LIPASE, BLOOD  I-STAT BETA HCG BLOOD, ED ([facility], [facility], [facility] ONLY)    Imaging Review Dg Abd Acute W/chest  07/05/2015   CLINICAL DATA:  Gastric sleeve placed 6 weeks ago. Tonight presents with severe abdominal pain.  EXAM: DG ABDOMEN ACUTE W/ 1V CHEST  COMPARISON:  CT abdomen and pelvis 06/13/2015.  Chest 04/22/2014  FINDINGS: Normal heart size and pulmonary vascularity. No focal airspace disease or consolidation in the lungs. No blunting of costophrenic angles. No pneumothorax. Mediastinal contours appear intact.  Scattered gas and stool in the colon. No small or large bowel distention. No free intra-abdominal air. No abnormal air-fluid levels. No radiopaque stones. Visualized bones appear intact. Surgical clips in the left abdomen consistent with history of gastric sleeve procedure.  IMPRESSION: No evidence of active pulmonary disease. Nonobstructive bowel gas pattern.   Electronically Signed   By: Lucienne Capers M.D.   On: 07/05/2015 06:03   I have personally reviewed and evaluated these images and lab results as part of my medical decision-making.  Filed Vitals:   07/05/15 0634  BP: 157/82  Pulse: 82  Temp:   Resp: 18    Meds given in ED:  Medications  fentaNYL (SUBLIMAZE) injection 50 mcg (50 mcg Intravenous Given 07/05/15 0526)  ondansetron (ZOFRAN) injection 4 mg (4 mg Intravenous Given 07/05/15 1610)  morphine 4 MG/ML injection 4 mg (4 mg Intravenous Given 07/05/15 9604)  ketorolac (TORADOL) 30 MG/ML injection 30 mg (30 mg Intravenous Given 07/05/15 0803)     MDM   Final diagnoses:  Painful menstrual periods  UTI (lower urinary tract infection)    Pt presents with lower abdominal pain with associated nausea. No relief with tylenol or ibuprofen at home. Pt reports starting menstrual cycle yesterday and reports similar pain with periods. Gastric sleeve surgery done 6 weeks ago. VSS. Mild TTP at suprapubic region. Pt given zofran and pain meds.  UA consistent with UTI. Labs unremarkable. Acute abdominal series shows no active pulmonary disease and nonobstructive bowel gas pattern. Pt reports her pain has significantly improved. I suspect pain is likely due to menstrual cycle. I do not feel that further workup or imaging is warranted at this time. Pt able to tolerate PO. Plan to d/c pt home with antibiotic for UTI and pain meds. Pt advised to take pain meds with food. Advised pt to follow up with PCP from resource guide this week.   Evaluation does not show pathology requring ongoing emergent intervention or admission. Pt is hemodynamically stable and mentating appropriately. Discussed findings/results and plan with patient/guardian, who agrees with plan. All questions answered. Return precautions discussed and outpatient follow up given.      [address] [place], [place] 07/05/15 5409  Rolland Porter, MD 07/05/15 2303

## 2015-07-05 NOTE — Discharge Instructions (Signed)
Please take all your prescriptions as prescribed. Only take the Toradol as needed for pain relief. Please follow up with a primary care provider from the Resource Guide provided below in 1 week. Please return to the Emergency Department if symptoms worsen or new onset of fever, vomiting, increase in vaginal bleeding, lightheaded, dizziness.   Emergency Department Resource Guide 1) Find a Doctor and Pay Out of Pocket Although you won't have to find out who is covered by your insurance plan, it is a good idea to ask around and get recommendations. You will then need to call the office and see if the doctor you have chosen will accept you as a new patient and what types of options they offer for patients who are self-pay. Some doctors offer discounts or will set up payment plans for their patients who do not have insurance, but you will need to ask so you aren't surprised when you get to your appointment.  2) Contact Your Local Health Department Not all health departments have doctors that can see patients for sick visits, but many do, so it is worth a call to see if yours does. If you don't know where your local health department is, you can check in your phone book. The CDC also has a tool to help you locate your state's health department, and many state websites also have listings of all of their local health departments.  3) Find a [facility] If your illness is not likely to be very severe or complicated, you may want to try a walk in clinic. These are popping up all over the country in pharmacies, drugstores, and shopping centers. They're usually staffed by nurse practitioners or physician assistants that have been trained to treat common illnesses and complaints. They're usually fairly quick and inexpensive. However, if you have serious medical issues or chronic medical problems, these are probably not your best option.  No Primary Care Doctor: - Call Health Connect at  [phone] - they can  help you locate a primary care doctor that  accepts your insurance, provides certain services, etc. - Physician Referral Service- [phone]  Chronic Pain Problems: Organization         Address  Phone   Notes  [facility]  [phone] Patients need to be referred by their primary care doctor.   Medication Assistance: Organization         Address  Phone   Notes  [facility] Medication [facility] [place] [facility] [place], [place] [address] [phone] --Must be a resident of [facility] -- Must have NO insurance coverage whatsoever (no Medicaid/ Medicare, etc.) -- The pt. MUST have a primary care doctor that directs their care regularly and follows them in the community   MedAssist  [phone]   [employer]  [phone]    Agencies that provide inexpensive medical care: Organization         Address  Phone   Notes  [place]  [phone]   [facility]    [phone]   [facility] [place], [place] [address] [phone]   [place] [address] [place] [phone]   Planned Parenthood    [phone]   [facility]    [phone]   [place] and [place], [place] Phone:  [phone], Fax:  [phone] Hours  of Operation:  9 am - 6 pm, M-F.  Also accepts Medicaid/Medicare and self-pay.  [facility] for [place] [address], [address], [place] Phone: [phone], Fax: [phone]. Hours of Operation:  8:30 am - 5:30 pm, M-F.  Also accepts Medicaid and self-pay.  [facility] [place] [address], [place] Phone: [phone]   [place], [place], [place] [phone], Ext. [phone] Mondays & Thursdays: 7-9 AM.  First 15 patients are seen on a first come, first serve basis.    [place] Providers:  Organization         Address  Phone   Notes  [facility] [address], [place] [phone] Also accepts self-pay patients.  [facility] [address], [place]  [phone]   [place], [address], [place] [phone]   [facility] [address], [place] [phone]   Lucianne Lei [address], [place]   [phone] Only accepts [place] Access [place] patients after they have their name applied to their card.   Self-Pay (no insurance) in [facility]:  Organization         Address  Phone   Notes  Sickle Cell Patients, [facility] [place] [phone]   [facility] [place] [phone]   [facility] [place]  [place], [place], [place] [phone]   [facility]/Dr. Osei-Bonsu  [address], [place] or [address], [place] [phone] Phone number for both [place] and [place] locations is the same.  Urgent Medical and [facility] [address] [place] [phone]   [facility] [address] [place] or [address] [phone]   [facility] [address], [place] [phone], phone; [phone], fax Sees patients 1st and 3rd Saturday of every month.  Must not qualify for public or private insurance (i.e. Medicaid, Medicare, [place] Health Choice, Veterans' Benefits)  Household income should be no more than 200% of the poverty level The clinic cannot treat you if you are pregnant or think you are pregnant  Sexually transmitted diseases are not treated at the clinic.    Dental Care: Organization         Address  Phone  Notes  [facility] Department of [facility] [place] [phone] Accepts children up to age 63 who are enrolled in [place] or [place]; pregnant women with a Medicaid card; and children who have applied for Medicaid or [place] Health Choice, but were declined, whose parents can pay a reduced fee at time of service.  [facility] Department of [facility]  [address], [place] [phone] Accepts children up to age 5 who are enrolled in [place] or [place]; pregnant women with a Medicaid card; and children who have applied for Medicaid or [place] Health Choice, but were declined, whose parents can pay a reduced fee at time of service.  [place] Adult Dental Access PROGRAM  [place] [phone] Patients are seen by appointment only. Walk-ins are not accepted. [place] will see  patients 26 years of age and older. Monday - Tuesday (8am-5pm) Most Wednesdays (8:30-5pm) $30 per visit, cash only  [facility] Adult Dental Access PROGRAM  [address], [facility] [phone] Patients are seen by appointment only. Walk-ins are not accepted. [place] will see patients 42 years of age and older. One Wednesday Evening (Monthly: Volunteer Based).  $30 per visit, cash only  [place]  [phone] for adults; Children under age 60, call Graduate Pediatric Dentistry at [phone]. Children aged 26-14, please call [phone] to request a pediatric application.  Dental services are provided in all areas of dental care including fillings, crowns and bridges, complete and partial dentures, implants, gum treatment, root canals, and extractions. Preventive care is also provided. Treatment is provided to both adults and children. Patients are selected via a lottery and there is often a waiting list.   [facility] [address] [place]  [phone] [email or web address]   [facility] [address] [place], [place] [phone], Ext. [phone] Second and Fourth Thursday of each month, opens at 6:30 AM; Clinic ends at 9 AM.  Patients are seen on a first-come first-served basis, and a limited number are seen during each clinic.   [facility]  [address] Hillard Danker [place], [place] [phone]   Eligibility Requirements You must have lived in [place], [place], or [place] counties for at least the last three months.   You cannot be eligible for state or federal sponsored [employer], including [employer], [place], or [employer].   You generally cannot be eligible for healthcare insurance through your employer.    How to apply: Eligibility screenings are held every Tuesday and Wednesday afternoon from 1:00 pm until 4:00 pm. You do not need an appointment for the interview!  [facility] [address] [place], [place]   [place]  [facility] [place] Department  [place]  [phone]    Behavioral Health Resources in the Community: Intensive Outpatient Programs Organization         Address  Phone  Notes  [place]. [address] [place], [place] [phone]   [facility] Outpatient [address], [place], [place]   ADS: Alcohol & Drug Svcs [address] [place], [place]   [place] [address],  [place], [place] or [phone]   Substance Abuse Resources Organization         Address  Phone  Notes  Alcohol and Drug Services  [phone]   [place]  [phone]   [facility] [place]   Chinita Pester  [phone]   Residential & Outpatient Substance Abuse Program  [phone]   Psychological Services Organization         Address  Phone  Notes  [facility] [place]  [place]  [phone]   [place] [address] [place] or [phone]    Mobile Crisis Teams Organization         Address  Phone  Notes  [facility], Mobile Crisis Care Unit  [phone]   Assertive Psychotherapeutic Services  [address] [place], [place]   [facility] [address] [place] [phone]    Self-Help/Support Groups Organization  Address  Phone             Notes  [place]. of [place] - variety of support groups  [place] Call for more information  Narcotics Anonymous (NA), [facility] [address], [employer] [place]  2 meetings at this location   Special educational needs teacher         Address  Phone  Notes  [facility]idential Treatment [place],    [place]  [phone]   [facility]  [address], [place] [address], [place], [place]   [place], [place] [phone] Admissions: 8am-3pm M-F  Incentives Substance [place] [address],    [place], [place] [phone]   [facility] [address] [place], [place], [place]   [facility] [address]  [place], [place]   [facility]ight Programs - Intensive Outpatient [address] Kristeen [address], [place], [place]   [facility] ([place] [place].,  [place], [place] [phone] or [phone]   [facility] Services (RTS) [address], [place], [place] Accepts Medicaid  [place] [address]  [place] [phone] Substance Abuse/Addiction Treatment   [facility] Organization         Address  Phone  Notes  [facility]  [phone]   Domenic Schwab, PhD [address] Arlis Porta [place], [place]   [phone] or  [phone]   [place], [place] [phone]   [facility] [address] [place], [place] [phone] Insurance/Medicaid/sponsorship through [facility] and Families [address], [address] [place]                                    [place], [place] [phone] [place] [address], [place] [phone]    Dr. Adele Schilder  [phone]   [facility] Dept. 1) [address], [place] 2) [place] 3)  [place] [address], [place] [phone]  [phone]   [place] [phone] or [phone] (After Hours)

## 2015-09-05 ENCOUNTER — Encounter (HOSPITAL_COMMUNITY): Payer: Self-pay

## 2015-09-05 ENCOUNTER — Emergency Department (HOSPITAL_COMMUNITY)
Admission: EM | Admit: 2015-09-05 | Discharge: 2015-09-05 | Disposition: A | Discharge status: Home / Self Care | Payer: BLUE CROSS/BLUE SHIELD | Attending: Emergency Medicine | Admitting: Emergency Medicine

## 2015-09-05 DIAGNOSIS — Z3202 Encounter for pregnancy test, result negative: Secondary | ICD-10-CM | POA: Insufficient documentation

## 2015-09-05 DIAGNOSIS — K219 Gastro-esophageal reflux disease without esophagitis: Secondary | ICD-10-CM | POA: Diagnosis not present

## 2015-09-05 DIAGNOSIS — Z9851 Tubal ligation status: Secondary | ICD-10-CM | POA: Diagnosis not present

## 2015-09-05 DIAGNOSIS — R1030 Lower abdominal pain, unspecified: Secondary | ICD-10-CM | POA: Insufficient documentation

## 2015-09-05 DIAGNOSIS — Z79899 Other long term (current) drug therapy: Secondary | ICD-10-CM | POA: Diagnosis not present

## 2015-09-05 DIAGNOSIS — J45909 Unspecified asthma, uncomplicated: Secondary | ICD-10-CM | POA: Diagnosis not present

## 2015-09-05 DIAGNOSIS — Z86018 Personal history of other benign neoplasm: Secondary | ICD-10-CM | POA: Insufficient documentation

## 2015-09-05 DIAGNOSIS — E876 Hypokalemia: Secondary | ICD-10-CM | POA: Diagnosis not present

## 2015-09-05 DIAGNOSIS — M545 Low back pain, unspecified: Secondary | ICD-10-CM

## 2015-09-05 DIAGNOSIS — E669 Obesity, unspecified: Secondary | ICD-10-CM | POA: Diagnosis not present

## 2015-09-05 DIAGNOSIS — I10 Essential (primary) hypertension: Secondary | ICD-10-CM | POA: Insufficient documentation

## 2015-09-05 DIAGNOSIS — D649 Anemia, unspecified: Secondary | ICD-10-CM | POA: Insufficient documentation

## 2015-09-05 LAB — COMPREHENSIVE METABOLIC PANEL
ALBUMIN: 3.7 g/dL (ref 3.5–5.0)
ALT: 12 U/L — ABNORMAL LOW (ref 14–54)
ANION GAP: 9 (ref 5–15)
AST: 16 U/L (ref 15–41)
Alkaline Phosphatase: 53 U/L (ref 38–126)
BILIRUBIN TOTAL: 0.3 mg/dL (ref 0.3–1.2)
BUN: 11 mg/dL (ref 6–20)
CALCIUM: 9.1 mg/dL (ref 8.9–10.3)
CO2: 28 mmol/L (ref 22–32)
CREATININE: 0.75 mg/dL (ref 0.44–1.00)
Chloride: 104 mmol/L (ref 101–111)
GFR calc non Af Amer: >60 mL/min (ref >60)
GLUCOSE: 80 mg/dL (ref 65–99)
Potassium: 3.7 mmol/L (ref 3.5–5.1)
Sodium: 141 mmol/L (ref 135–145)
TOTAL PROTEIN: 7.8 g/dL (ref 6.5–8.1)

## 2015-09-05 LAB — CBC
HCT: 32.7 % — ABNORMAL LOW (ref 36.0–46.0)
HEMOGLOBIN: 10 g/dL — AB (ref 12.0–15.0)
MCH: 25.7 pg — ABNORMAL LOW (ref 26.0–34.0)
MCHC: 30.6 g/dL (ref 30.0–36.0)
MCV: 84.1 fL (ref 78.0–100.0)
Platelets: 325 10*3/uL (ref 150–400)
RBC: 3.89 MIL/uL (ref 3.87–5.11)
RDW: 14.8 % (ref 11.5–15.5)
WBC: 10 10*3/uL (ref 4.0–10.5)

## 2015-09-05 LAB — LIPASE, BLOOD: Lipase: 25 U/L (ref 11–51)

## 2015-09-05 LAB — URINALYSIS, ROUTINE W REFLEX MICROSCOPIC
Bilirubin Urine: NEGATIVE
Glucose, UA: NEGATIVE mg/dL
Hgb urine dipstick: NEGATIVE
Ketones, ur: NEGATIVE mg/dL
Leukocytes, UA: NEGATIVE
Nitrite: NEGATIVE
Protein, ur: NEGATIVE mg/dL
Specific Gravity, Urine: 1.033 — ABNORMAL HIGH (ref 1.005–1.030)
pH: 6.5 (ref 5.0–8.0)

## 2015-09-05 LAB — POC URINE PREG, ED: PREG TEST UR: NEGATIVE

## 2015-09-05 MED ORDER — OXYCODONE-ACETAMINOPHEN 5-325 MG PO TABS: 2.0000 | ORAL_TABLET | ORAL | Status: DC | PRN

## 2015-09-05 MED ORDER — METHOCARBAMOL 750 MG PO TABS: 750.0000 mg | ORAL_TABLET | Freq: Four times a day (QID) | ORAL | Status: DC

## 2015-09-05 NOTE — ED Notes (Signed)
Pt presents with c/o abdominal pain and flank pain. Pt reports the pain is in her lower abdomen and bilateral flank areas. No hx of kidney stones. Pt reports she was treated for a UTI within the last several months. Also reporting some nausea.

## 2015-09-05 NOTE — Discharge Instructions (Signed)

## 2015-09-05 NOTE — ED Provider Notes (Signed)
CSN: [id number]     Arrival date & time 09/05/15  1830 History   First MD Initiated Contact with Patient 09/05/15 2129     Chief Complaint  Patient presents with  . Abdominal Pain  . Flank Pain     (Consider location/radiation/quality/duration/timing/severity/associated sxs/prior Treatment) HPI Comments: Patient here complaining of several days of lower back pain without radiation to her legs. Also complains of some suprapubic pain without vaginal bleeding or discharge. Denies any dysuria or hematuria. No fever or chills. Symptoms are worse with certain positions. Denies any diarrhea. No treatment use prior to arrival  Patient is a 39 y.o. female presenting with abdominal pain and flank pain. The history is provided by the patient.  Abdominal Pain Flank Pain Associated symptoms include abdominal pain.    Past Medical History  Diagnosis Date  . Hypertension   . Fibroid   . Anemia   . Abnormal Pap smear   . Hypopotassemia   . Esophageal reflux   . Palpitations   . Pregnancy induced hypertension   . Dysrhythmia     pvc's  . Asthma     as child  . Obesity    Past Surgical History  Procedure Laterality Date  . Tubal ligation    . Colposcopy    . Oophorectomy      Left  . Laparoscopic gastric sleeve resection     Family History  Problem Relation Age of Onset  . Hypertension Father   . Kidney disease Father   . Cancer Father   . Diabetes Father   . Diabetes Maternal Grandmother   . Hypertension Paternal Grandmother    Social History  Substance Use Topics  . Smoking status: Never Smoker   . Smokeless tobacco: Never Used  . Alcohol Use: No   OB History    Gravida Para Term Preterm AB TAB SAB Ectopic Multiple Living   2 2 2       2      Review of Systems  Gastrointestinal: Positive for abdominal pain.  Genitourinary: Positive for flank pain.  All other systems reviewed and are negative.     Allergies  Sulfamethoxazole and Sulfa antibiotics  Home  Medications   Prior to Admission medications   Medication Sig Start Date End Date Taking? Authorizing Provider  albuterol (PROVENTIL HFA;VENTOLIN HFA) 108 (90 BASE) MCG/ACT inhaler Inhale 1-2 puffs into the lungs every 6 (six) hours as needed for wheezing or shortness of breath.   Yes Historical Provider, MD  Calcium Citrate-Vitamin D (CALCIUM + D PO) Take 1 tablet by mouth 2 (two) times daily.   Yes Historical Provider, MD  Cyanocobalamin (B-12 PO) Take 1 tablet by mouth daily.   Yes Historical Provider, MD  IRON PO Take 1 tablet by mouth daily.   Yes Historical Provider, MD  lubiprostone (AMITIZA) 24 MCG capsule Take 1 capsule (24 mcg total) by mouth 2 (two) times daily with a meal. Patient taking differently: Take 24 mcg by mouth 2 (two) times daily as needed for constipation.  06/13/15  Yes Hannah Muthersbaugh, PA-C  Multiple Vitamin (MULTIVITAMIN WITH MINERALS) TABS tablet Take 1 tablet by mouth daily.    Yes Historical Provider, MD  ranitidine (ZANTAC) 150 MG tablet Take 150 mg by mouth 2 (two) times daily as needed for heartburn.  05/28/15  Yes Historical Provider, MD  ursodiol (ACTIGALL) 300 MG capsule Take 300 mg by mouth 2 (two) times daily.  05/28/15  Yes Historical Provider, MD  verapamil (CALAN) 120 MG tablet  Take 120 mg by mouth 3 (three) times daily.   Yes Historical Provider, MD  naproxen (NAPROSYN) 375 MG tablet Take 1 tablet (375 mg total) by mouth 2 (two) times daily. Patient not taking: Reported on 09/05/2015 07/05/15   Nona Dell, PA-C  nitrofurantoin, macrocrystal-monohydrate, (MACROBID) 100 MG capsule Take 1 capsule (100 mg total) by mouth 2 (two) times daily. Patient not taking: Reported on 09/05/2015 07/05/15   Nona Dell, PA-C  nitrofurantoin, macrocrystal-monohydrate, (MACROBID) 100 MG capsule Take 1 capsule (100 mg total) by mouth 2 (two) times daily. Patient not taking: Reported on 09/05/2015 07/05/15   Nona Dell, PA-C  potassium  chloride SA (K-DUR,KLOR-CON) 20 MEQ tablet Take 1 tablet (20 mEq total) by mouth 2 (two) times daily. Patient not taking: Reported on 06/13/2015 03/18/15   Liliane Shi, PA-C  spironolactone (ALDACTONE) 50 MG tablet Take 1 tablet (50 mg total) by mouth daily. Patient not taking: Reported on 06/13/2015 03/20/15   Liliane Shi, PA-C  verapamil (CALAN-SR) 180 MG CR tablet Take 1 tablet (180 mg total) by mouth 2 (two) times daily. Patient not taking: Reported on 06/13/2015 03/18/15   Liliane Shi, PA-C   BP 134/42 mmHg  Pulse 72  Temp(Src) 98 F (36.7 C) (Oral)  Resp 17  SpO2 99%  LMP 08/27/2015 Physical Exam  Constitutional: She is oriented to person, place, and time. She appears well-developed and well-nourished.  Non-toxic appearance. No distress.  HENT:  Head: Normocephalic and atraumatic.  Eyes: Conjunctivae, EOM and lids are normal. Pupils are equal, round, and reactive to light.  Neck: Normal range of motion. Neck supple. No tracheal deviation present. No thyroid mass present.  Cardiovascular: Normal rate, regular rhythm and normal heart sounds.  Exam reveals no gallop.   No murmur heard. Pulmonary/Chest: Effort normal and breath sounds normal. No stridor. No respiratory distress. She has no decreased breath sounds. She has no wheezes. She has no rhonchi. She has no rales.  Abdominal: Soft. Normal appearance and bowel sounds are normal. She exhibits no distension. There is no tenderness. There is no rigidity, no rebound, no guarding and no CVA tenderness.  Musculoskeletal: Normal range of motion. She exhibits no edema or tenderness.       Back:  Neurological: She is alert and oriented to person, place, and time. She has normal strength. No cranial nerve deficit or sensory deficit. GCS eye subscore is 4. GCS verbal subscore is 5. GCS motor subscore is 6.  Skin: Skin is warm and dry. No abrasion and no rash noted.  Psychiatric: She has a normal mood and affect. Her speech is normal and  behavior is normal.  Nursing note and vitals reviewed.   ED Course  Procedures (including critical care time) Labs Review Labs Reviewed  COMPREHENSIVE METABOLIC PANEL - Abnormal; Notable for the following:    ALT 12 (*)    All other components within normal limits  CBC - Abnormal; Notable for the following:    Hemoglobin 10.0 (*)    HCT 32.7 (*)    MCH 25.7 (*)    All other components within normal limits  URINALYSIS, ROUTINE W REFLEX MICROSCOPIC (NOT AT [facility]) - Abnormal; Notable for the following:    APPearance HAZY (*)    Specific Gravity, Urine 1.033 (*)    All other components within normal limits  LIPASE, BLOOD  POC URINE PREG, ED    Imaging Review No results found. I have personally reviewed and evaluated these images and  lab results as part of my medical decision-making.   EKG Interpretation None      MDM   Final diagnoses:  None    Patient likely musculoskeletal back pain. Will treat symptomatically    Lacretia Leigh, MD 09/05/15 2157

## 2015-09-17 ENCOUNTER — Encounter (HOSPITAL_COMMUNITY): Payer: Self-pay | Admitting: Emergency Medicine

## 2015-09-17 ENCOUNTER — Emergency Department (HOSPITAL_COMMUNITY): Payer: BLUE CROSS/BLUE SHIELD

## 2015-09-17 ENCOUNTER — Emergency Department (HOSPITAL_COMMUNITY)
Admission: EM | Admit: 2015-09-17 | Discharge: 2015-09-17 | Disposition: A | Discharge status: Home / Self Care | Payer: BLUE CROSS/BLUE SHIELD | Attending: Emergency Medicine | Admitting: Emergency Medicine

## 2015-09-17 DIAGNOSIS — I10 Essential (primary) hypertension: Secondary | ICD-10-CM | POA: Insufficient documentation

## 2015-09-17 DIAGNOSIS — I493 Ventricular premature depolarization: Secondary | ICD-10-CM | POA: Diagnosis not present

## 2015-09-17 DIAGNOSIS — S6702XA Crushing injury of left thumb, initial encounter: Secondary | ICD-10-CM | POA: Diagnosis not present

## 2015-09-17 DIAGNOSIS — E876 Hypokalemia: Secondary | ICD-10-CM | POA: Diagnosis not present

## 2015-09-17 DIAGNOSIS — E669 Obesity, unspecified: Secondary | ICD-10-CM | POA: Insufficient documentation

## 2015-09-17 DIAGNOSIS — W231XXA Caught, crushed, jammed, or pinched between stationary objects, initial encounter: Secondary | ICD-10-CM | POA: Diagnosis not present

## 2015-09-17 DIAGNOSIS — D649 Anemia, unspecified: Secondary | ICD-10-CM | POA: Diagnosis not present

## 2015-09-17 DIAGNOSIS — Y998 Other external cause status: Secondary | ICD-10-CM | POA: Insufficient documentation

## 2015-09-17 DIAGNOSIS — Y9289 Other specified places as the place of occurrence of the external cause: Secondary | ICD-10-CM | POA: Diagnosis not present

## 2015-09-17 DIAGNOSIS — Z0289 Encounter for other administrative examinations: Secondary | ICD-10-CM | POA: Diagnosis not present

## 2015-09-17 DIAGNOSIS — J45909 Unspecified asthma, uncomplicated: Secondary | ICD-10-CM | POA: Insufficient documentation

## 2015-09-17 DIAGNOSIS — Z86018 Personal history of other benign neoplasm: Secondary | ICD-10-CM | POA: Insufficient documentation

## 2015-09-17 DIAGNOSIS — Y9389 Activity, other specified: Secondary | ICD-10-CM | POA: Insufficient documentation

## 2015-09-17 DIAGNOSIS — Z79899 Other long term (current) drug therapy: Secondary | ICD-10-CM | POA: Diagnosis not present

## 2015-09-17 DIAGNOSIS — K219 Gastro-esophageal reflux disease without esophagitis: Secondary | ICD-10-CM | POA: Insufficient documentation

## 2015-09-17 MED ORDER — HYDROCODONE-ACETAMINOPHEN 5-325 MG PO TABS
2.0000 | ORAL_TABLET | Freq: Once | ORAL | Status: AC
  Administered 2015-09-17: 2 via ORAL
  Filled 2015-09-17: qty 2

## 2015-09-17 NOTE — ED Notes (Signed)
Spoke with Santiago Glad r/t pt's request for work note until Tuesday.  Per Santiago Glad, "it's fine".

## 2015-09-17 NOTE — ED Provider Notes (Signed)
CSN: [id number]     Arrival date & time 09/17/15  1553 History  By signing my name below, I, Tammie [name], attest that this documentation has been prepared under the direction and in the presence of Marcene Brawn, PA-C. Electronically Signed: Starleen [name] ED Scribe. 09/17/2015. 5:33 PM.   Chief Complaint  Patient presents with  . thumb injury    The history is provided by the patient. No language interpreter was used.   HPI Comments: Tammie [name] is a 39 y.o. female who presents to the Emergency Department complaining of constant, moderate left thumb pain onset 2 hours ago; unrelieved by ibuprofen.  She reports her automatic trunk crushed her thumb laterally and medially for ten minutes.  Last tetanus was 3-4 years ago.   Past Medical History  Diagnosis Date  . Hypertension   . Fibroid   . Anemia   . Abnormal Pap smear   . Hypopotassemia   . Esophageal reflux   . Palpitations   . Pregnancy induced hypertension   . Dysrhythmia     pvc's  . Asthma     as child  . Obesity    Past Surgical History  Procedure Laterality Date  . Tubal ligation    . Colposcopy    . Oophorectomy      Left  . Laparoscopic gastric sleeve resection     Family History  Problem Relation Age of Onset  . Hypertension Father   . Kidney disease Father   . Cancer Father   . Diabetes Father   . Diabetes Maternal Grandmother   . Hypertension Paternal Grandmother    Social History  Substance Use Topics  . Smoking status: Never Smoker   . Smokeless tobacco: Never Used  . Alcohol Use: No   OB History    Gravida Para Term Preterm AB TAB SAB Ectopic Multiple Living   2 2 2       2      Review of Systems  Musculoskeletal: Positive for joint swelling and arthralgias.  All other systems reviewed and are negative.     Allergies  Sulfamethoxazole and Sulfa antibiotics  Home Medications   Prior to Admission medications   Medication Sig Start Date End Date Taking? Authorizing Provider   albuterol (PROVENTIL HFA;VENTOLIN HFA) 108 (90 BASE) MCG/ACT inhaler Inhale 1-2 puffs into the lungs every 6 (six) hours as needed for wheezing or shortness of breath.    Historical Provider, MD  Calcium Citrate-Vitamin D (CALCIUM + D PO) Take 1 tablet by mouth 2 (two) times daily.    Historical Provider, MD  Cyanocobalamin (B-12 PO) Take 1 tablet by mouth daily.    Historical Provider, MD  IRON PO Take 1 tablet by mouth daily.    Historical Provider, MD  lubiprostone (AMITIZA) 24 MCG capsule Take 1 capsule (24 mcg total) by mouth 2 (two) times daily with a meal. Patient taking differently: Take 24 mcg by mouth 2 (two) times daily as needed for constipation.  06/13/15   Hannah Muthersbaugh, PA-C  methocarbamol (ROBAXIN-750) 750 MG tablet Take 1 tablet (750 mg total) by mouth 4 (four) times daily. 09/05/15   Lacretia Leigh, MD  Multiple Vitamin (MULTIVITAMIN WITH MINERALS) TABS tablet Take 1 tablet by mouth daily.     Historical Provider, MD  naproxen (NAPROSYN) 375 MG tablet Take 1 tablet (375 mg total) by mouth 2 (two) times daily. Patient not taking: Reported on 09/05/2015 07/05/15   Nona Dell, PA-C  nitrofurantoin, macrocrystal-monohydrate, (MACROBID) 100 MG  capsule Take 1 capsule (100 mg total) by mouth 2 (two) times daily. Patient not taking: Reported on 09/05/2015 07/05/15   Nona Dell, PA-C  nitrofurantoin, macrocrystal-monohydrate, (MACROBID) 100 MG capsule Take 1 capsule (100 mg total) by mouth 2 (two) times daily. Patient not taking: Reported on 09/05/2015 07/05/15   Nona Dell, PA-C  oxyCODONE-acetaminophen (PERCOCET/ROXICET) 5-325 MG tablet Take 2 tablets by mouth every 4 (four) hours as needed for severe pain. 09/05/15   Lacretia Leigh, MD  potassium chloride SA (K-DUR,KLOR-CON) 20 MEQ tablet Take 1 tablet (20 mEq total) by mouth 2 (two) times daily. Patient not taking: Reported on 06/13/2015 03/18/15   Liliane Shi, PA-C  ranitidine (ZANTAC) 150 MG  tablet Take 150 mg by mouth 2 (two) times daily as needed for heartburn.  05/28/15   Historical Provider, MD  spironolactone (ALDACTONE) 50 MG tablet Take 1 tablet (50 mg total) by mouth daily. Patient not taking: Reported on 06/13/2015 03/20/15   Liliane Shi, PA-C  ursodiol (ACTIGALL) 300 MG capsule Take 300 mg by mouth 2 (two) times daily.  05/28/15   Historical Provider, MD  verapamil (CALAN) 120 MG tablet Take 120 mg by mouth 3 (three) times daily.    Historical Provider, MD  verapamil (CALAN-SR) 180 MG CR tablet Take 1 tablet (180 mg total) by mouth 2 (two) times daily. Patient not taking: Reported on 06/13/2015 03/18/15   Liliane Shi, PA-C   BP 161/105 mmHg  Pulse 88  Temp(Src) 98.2 F (36.8 C)  Resp 20  SpO2 100%  LMP 08/27/2015 Physical Exam  Constitutional: She is oriented to person, place, and time. She appears well-developed and well-nourished. No distress.  HENT:  Head: Normocephalic and atraumatic.  Eyes: Conjunctivae and EOM are normal.  Neck: Neck supple. No tracheal deviation present.  Cardiovascular: Normal rate.   Pulmonary/Chest: Effort normal. No respiratory distress.  Musculoskeletal: Normal range of motion.  Superficial laceration to corner medial and lateral nail.  Ecchymosis distal finger.    Neurological: She is alert and oriented to person, place, and time.  Skin: Skin is warm and dry.  Psychiatric: She has a normal mood and affect. Her behavior is normal.  Nursing note and vitals reviewed.   ED Course  Procedures (including critical care time)  DIAGNOSTIC STUDIES: Oxygen Saturation is 100% on RA, normal by my interpretation.    COORDINATION OF CARE:  5:28 PM Will splint injury and prescribe pain medication.  Patient should continue to use ice and f/u with referral to hand surgeon.  Return precautions advised.  Patient acknowledges and agrees with plan.    Labs Review Labs Reviewed - No data to display  Imaging Review Dg Finger Thumb  Left  09/17/2015  CLINICAL DATA:  Thumb injury/laceration EXAM: LEFT THUMB 2+V COMPARISON:  None. FINDINGS: No fracture or dislocation is seen. The joint spaces are preserved. Visualized soft tissues are within normal limits. No radiopaque foreign body is seen. IMPRESSION: No fracture, dislocation, or radiopaque foreign body is seen. Electronically Signed   By: Julian Hy M.D.   On: 09/17/2015 16:39   I have personally reviewed and evaluated these images and lab results as part of my medical decision-making.   EKG Interpretation None      MDM   Final diagnoses:  Crush injury to thumb, left, initial encounter    Splint Ice Return if any problems.  I personally performed the services in this documentation, which was scribed in my presence.  The recorded information  has been reviewed and considered.   Ronnald Collum.  Fransico Meadow, PA-C 09/17/15 [place], DO 09/17/15 2111

## 2015-09-17 NOTE — ED Notes (Signed)
Pt requesting work note until Tuesday.  Informed will speak to Santiago Glad, [place].

## 2015-09-17 NOTE — ED Notes (Signed)
Patient states she closed trunk of car on left thumb-bleeding contained

## 2015-09-17 NOTE — ED Notes (Signed)
Called [facility] & informed of pending order.  Spoke with [employer].

## 2015-09-17 NOTE — Discharge Instructions (Signed)
Crush Injury, Fingers or Toes °A crush injury to the fingers or toes means the tissues have been damaged by being squeezed (compressed). There will be bleeding into the tissues and swelling. Often, blood will collect under the skin. When this happens, the skin on the finger often dies and may slough off (shed) 1 week to 10 days later. Usually, new skin is growing underneath. If the injury has been too severe and the tissue does not survive, the damaged tissue may begin to turn black over several days.  °Wounds which occur because of the crushing may be stitched (sutured) shut. However, crush injuries are more likely to become infected than other injuries. These wounds may not be closed as tightly as other types of cuts to prevent infection. Nails involved are often lost. These usually grow back over several weeks.  °DIAGNOSIS °X-rays may be taken to see if there is any injury to the bones. °TREATMENT °Broken bones (fractures) may be treated with splinting, depending on the fracture. Often, no treatment is required for fractures of the last bone in the fingers or toes. °HOME CARE INSTRUCTIONS  °· The crushed part should be raised (elevated) above the heart or center of the chest as much as possible for the first several days or as directed. This helps with pain and lessens swelling. Less swelling increases the chances that the crushed part will survive. °· Put ice on the injured area. °¨ Put ice in a plastic bag. °¨ Place a towel between your skin and the bag. °¨ Leave the ice on for 15-20 minutes, 03-04 times a day for the first 2 days. °· Only take over-the-counter or prescription medicines for pain, discomfort, or fever as directed by your caregiver. °· Use your injured part only as directed. °· Change your bandages (dressings) as directed. °· Keep all follow-up appointments as directed by your caregiver. Not keeping your appointment could result in a chronic or permanent injury, pain, and disability. If there is  any problem keeping the appointment, you must call to reschedule. °SEEK IMMEDIATE MEDICAL CARE IF:  °· There is redness, swelling, or increasing pain in the wound area. °· Pus is coming from the wound. °· You have a fever. °· You notice a bad smell coming from the wound or dressing. °· The edges of the wound do not stay together after the sutures have been removed. °· You are unable to move the injured finger or toe. °MAKE SURE YOU:  °· Understand these instructions. °· Will watch your condition. °· Will get help right away if you are not doing well or get worse. °  °This information is not intended to replace advice given to you by your health care provider. Make sure you discuss any questions you have with your health care provider. °  °Document Released: 09/26/2005 Document Revised: 12/19/2011 Document Reviewed: 02/11/2011 °Elsevier Interactive Patient Education ©2016 [employer] ° °

## 2015-09-24 ENCOUNTER — Other Ambulatory Visit: Payer: Self-pay | Admitting: Physician Assistant

## 2015-09-24 NOTE — Telephone Encounter (Signed)
Please advise as to what patients current dose should be as she has two different strengths listed. Thanks, MI

## 2015-09-25 NOTE — Telephone Encounter (Signed)
Per last office note patient primary cardiologist is Truitt Merle. Will also route to Julaine Hua as patient last saw Richardson Dopp.

## 2015-09-25 NOTE — Telephone Encounter (Signed)
Called patient to clarify medication, but was unable to reach her. Will try again later.

## 2015-09-25 NOTE — Telephone Encounter (Signed)
Spoke with patient, she stated that she takes verapamil 120mg  tid. Her dose was reduced by a physician at the hospital after she had weight loss surgery. She stated that she still has medication on hand and she no longer uses [facility] so we should not have received a request from them. She knows that she needs an appointment and would like to see Richardson Dopp, but at this time she is at work and will call back later to schedule. She understood that she would need to schedule an appointment and that we would need to check with the provider before a refill could be authorized.

## 2016-01-16 ENCOUNTER — Emergency Department (HOSPITAL_COMMUNITY)
Admission: EM | Admit: 2016-01-16 | Discharge: 2016-01-16 | Disposition: A | Discharge status: Home / Self Care | Payer: BLUE CROSS/BLUE SHIELD | Attending: Emergency Medicine | Admitting: Emergency Medicine

## 2016-01-16 ENCOUNTER — Emergency Department (HOSPITAL_COMMUNITY): Payer: BLUE CROSS/BLUE SHIELD

## 2016-01-16 ENCOUNTER — Encounter (HOSPITAL_COMMUNITY): Payer: Self-pay | Admitting: Emergency Medicine

## 2016-01-16 DIAGNOSIS — S01502A Unspecified open wound of oral cavity, initial encounter: Secondary | ICD-10-CM | POA: Diagnosis not present

## 2016-01-16 DIAGNOSIS — J45909 Unspecified asthma, uncomplicated: Secondary | ICD-10-CM | POA: Diagnosis not present

## 2016-01-16 DIAGNOSIS — Y9389 Activity, other specified: Secondary | ICD-10-CM | POA: Insufficient documentation

## 2016-01-16 DIAGNOSIS — X58XXXA Exposure to other specified factors, initial encounter: Secondary | ICD-10-CM | POA: Diagnosis not present

## 2016-01-16 DIAGNOSIS — Z3202 Encounter for pregnancy test, result negative: Secondary | ICD-10-CM | POA: Diagnosis not present

## 2016-01-16 DIAGNOSIS — R252 Cramp and spasm: Secondary | ICD-10-CM | POA: Diagnosis not present

## 2016-01-16 DIAGNOSIS — I10 Essential (primary) hypertension: Secondary | ICD-10-CM | POA: Diagnosis not present

## 2016-01-16 DIAGNOSIS — Y9289 Other specified places as the place of occurrence of the external cause: Secondary | ICD-10-CM | POA: Diagnosis not present

## 2016-01-16 DIAGNOSIS — R4020 Unspecified coma: Secondary | ICD-10-CM

## 2016-01-16 DIAGNOSIS — Z79899 Other long term (current) drug therapy: Secondary | ICD-10-CM | POA: Diagnosis not present

## 2016-01-16 DIAGNOSIS — R531 Weakness: Secondary | ICD-10-CM | POA: Insufficient documentation

## 2016-01-16 DIAGNOSIS — Z862 Personal history of diseases of the blood and blood-forming organs and certain disorders involving the immune mechanism: Secondary | ICD-10-CM | POA: Insufficient documentation

## 2016-01-16 DIAGNOSIS — R32 Unspecified urinary incontinence: Secondary | ICD-10-CM | POA: Insufficient documentation

## 2016-01-16 DIAGNOSIS — Z8719 Personal history of other diseases of the digestive system: Secondary | ICD-10-CM | POA: Insufficient documentation

## 2016-01-16 DIAGNOSIS — R55 Syncope and collapse: Secondary | ICD-10-CM | POA: Diagnosis not present

## 2016-01-16 DIAGNOSIS — E669 Obesity, unspecified: Secondary | ICD-10-CM | POA: Insufficient documentation

## 2016-01-16 DIAGNOSIS — H1132 Conjunctival hemorrhage, left eye: Secondary | ICD-10-CM | POA: Diagnosis not present

## 2016-01-16 DIAGNOSIS — Z86018 Personal history of other benign neoplasm: Secondary | ICD-10-CM | POA: Diagnosis not present

## 2016-01-16 DIAGNOSIS — Y998 Other external cause status: Secondary | ICD-10-CM | POA: Insufficient documentation

## 2016-01-16 DIAGNOSIS — R5383 Other fatigue: Secondary | ICD-10-CM | POA: Diagnosis present

## 2016-01-16 LAB — URINALYSIS, ROUTINE W REFLEX MICROSCOPIC
BILIRUBIN URINE: NEGATIVE
GLUCOSE, UA: NEGATIVE mg/dL
HGB URINE DIPSTICK: NEGATIVE
Ketones, ur: NEGATIVE mg/dL
Leukocytes, UA: NEGATIVE
Nitrite: NEGATIVE
Protein, ur: NEGATIVE mg/dL
SPECIFIC GRAVITY, URINE: 1.016 (ref 1.005–1.030)
pH: 8 (ref 5.0–8.0)

## 2016-01-16 LAB — CBC
HCT: 31.7 % — ABNORMAL LOW (ref 36.0–46.0)
HEMOGLOBIN: 9.2 g/dL — AB (ref 12.0–15.0)
MCH: 22.2 pg — ABNORMAL LOW (ref 26.0–34.0)
MCHC: 29 g/dL — AB (ref 30.0–36.0)
MCV: 76.6 fL — AB (ref 78.0–100.0)
PLATELETS: 296 10*3/uL (ref 150–400)
RBC: 4.14 MIL/uL (ref 3.87–5.11)
RDW: 15.2 % (ref 11.5–15.5)
WBC: 10 10*3/uL (ref 4.0–10.5)

## 2016-01-16 LAB — BASIC METABOLIC PANEL
Anion gap: 9 (ref 5–15)
BUN: 8 mg/dL (ref 6–20)
CALCIUM: 8.8 mg/dL — AB (ref 8.9–10.3)
CHLORIDE: 105 mmol/L (ref 101–111)
CO2: 23 mmol/L (ref 22–32)
CREATININE: 0.69 mg/dL (ref 0.44–1.00)
GFR calc non Af Amer: >60 mL/min (ref >60)
GLUCOSE: 85 mg/dL (ref 65–99)
Potassium: 4 mmol/L (ref 3.5–5.1)
Sodium: 137 mmol/L (ref 135–145)

## 2016-01-16 LAB — I-STAT BETA HCG BLOOD, ED (MC, WL, AP ONLY): I-stat hCG, quantitative: <5 m[IU]/mL (ref <5)

## 2016-01-16 LAB — CK: Total CK: 587 U/L — ABNORMAL HIGH (ref 38–234)

## 2016-01-16 MED ORDER — LUBIPROSTONE 24 MCG PO CAPS: 24.0000 ug | ORAL_CAPSULE | Freq: Two times a day (BID) | ORAL | Status: DC

## 2016-01-16 MED ORDER — SODIUM CHLORIDE 0.9 % IV BOLUS (SEPSIS)
1000.0000 mL | Freq: Once | INTRAVENOUS | Status: AC
  Administered 2016-01-16: 1000 mL via INTRAVENOUS

## 2016-01-16 NOTE — ED Notes (Signed)
Per [facility] complains of BIL lower extremity weakness.  Patient states she woke up this morning and had urinated on herself.  History of gastric sleeve surgery 5 months ago without complications.  95/55 last pressure per EMS, patient recently reduced blood pressure medications.  Ambulatory on scene, alert and oriented, in no apparent distress at this time.

## 2016-01-16 NOTE — ED Provider Notes (Signed)
CSN: [id number]     Arrival date & time 01/16/16  1048 History   First MD Initiated Contact with Patient 01/16/16 1049     Chief Complaint  Patient presents with  . Fatigue   HPI Comments: 40 year old female who presents with weakness, LOC, and enuresis. She states she remembers waking up this morning and feeling that the bed was wet. Her son came in the room and propped her up because she was "making noises" which she does not remember. She also states that her eye was red and tongue was bloody. She is reporting associated bilateral calf cramping. Grandmother had a stroke at age 69. No hx of seizures. Pmhx gastric sleeve, anemia, HTN, constipation. BP has recently been running low however the patient stopped taking some of her BP meds 2 weeks ago to counteract this. Denies recent fevers or illness, dizziness, saddle anesthesia. Also complaining of pica and constipation.   Past Medical History  Diagnosis Date  . Hypertension   . Fibroid   . Anemia   . Abnormal Pap smear   . Hypopotassemia   . Esophageal reflux   . Palpitations   . Pregnancy induced hypertension   . Dysrhythmia     pvc's  . Asthma     as child  . Obesity    Past Surgical History  Procedure Laterality Date  . Tubal ligation    . Colposcopy    . Oophorectomy      Left  . Laparoscopic gastric sleeve resection     Family History  Problem Relation Age of Onset  . Hypertension Father   . Kidney disease Father   . Cancer Father   . Diabetes Father   . Diabetes Maternal Grandmother   . Hypertension Paternal Grandmother    Social History  Substance Use Topics  . Smoking status: Never Smoker   . Smokeless tobacco: Never Used  . Alcohol Use: No   OB History    Gravida Para Term Preterm AB TAB SAB Ectopic Multiple Living   2 2 2       2      Review of Systems  Constitutional: Positive for fatigue. Negative for fever.  HENT:       Bloody tongue  Eyes: Positive for redness.  Respiratory: Negative for  shortness of breath.   Cardiovascular: Negative for chest pain.  Gastrointestinal: Negative for abdominal pain.  Musculoskeletal:       Calf cramping  Neurological: Positive for weakness. Negative for dizziness, tremors, seizures, syncope, facial asymmetry, speech difficulty, light-headedness, numbness and headaches.    Allergies  Sulfamethoxazole and Sulfa antibiotics  Home Medications   Prior to Admission medications   Medication Sig Start Date End Date Taking? Authorizing Provider  albuterol (PROVENTIL HFA;VENTOLIN HFA) 108 (90 BASE) MCG/ACT inhaler Inhale 1-2 puffs into the lungs every 6 (six) hours as needed for wheezing or shortness of breath.   Yes Historical Provider, MD  verapamil (CALAN) 120 MG tablet Take 120 mg by mouth 3 (three) times daily.   Yes Historical Provider, MD   BP 125/70 mmHg  Pulse 86  Temp(Src) 98.3 F (36.8 C) (Oral)  Resp 16  SpO2 100%   Physical Exam  Constitutional: She is oriented to person, place, and time. She appears well-developed and well-nourished. No distress.  HENT:  Head: Normocephalic and atraumatic.  Open wound to right lateral tongue. Bleeding controlled  Eyes: Conjunctivae are normal. Pupils are equal, round, and reactive to light. Right eye exhibits no discharge.  Left eye exhibits no discharge. No scleral icterus.  Subconjunctival hemorrhage of L eye on the medial side  Neck: Normal range of motion.  Cardiovascular: Normal rate and regular rhythm.  Exam reveals no gallop and no friction rub.   No murmur heard. Pulmonary/Chest: Effort normal and breath sounds normal. No respiratory distress. She has no wheezes. She has no rales. She exhibits no tenderness.  Abdominal: Soft. She exhibits no distension and no mass. There is no tenderness. There is no rebound and no guarding.  Musculoskeletal:  Tenderness to calves bilaterally. No swelling or erythema  Neurological: She is alert and oriented to person, place, and time.  Mental Status:   Alert, oriented, thought content appropriate, able to give a coherent history. Speech fluent without evidence of aphasia. Able to follow 2 step commands without difficulty.  Cranial Nerves:  II:  Peripheral visual fields grossly normal, pupils equal, round, reactive to light III,IV, VI: ptosis not present, extra-ocular motions intact bilaterally  V,VII: smile symmetric, facial light touch sensation equal VIII: hearing grossly normal to voice  X: uvula elevates symmetrically  XI: bilateral shoulder shrug symmetric and strong XII: midline tongue extension without fassiculations    Skin: Skin is warm and dry.  Psychiatric: She has a normal mood and affect.    ED Course  Procedures (including critical care time) Labs Review Labs Reviewed  BASIC METABOLIC PANEL - Abnormal; Notable for the following:    Calcium 8.8 (*)    All other components within normal limits  CBC - Abnormal; Notable for the following:    Hemoglobin 9.2 (*)    HCT 31.7 (*)    MCV 76.6 (*)    MCH 22.2 (*)    MCHC 29.0 (*)    All other components within normal limits  CK - Abnormal; Notable for the following:    Total CK 587 (*)    All other components within normal limits  URINALYSIS, ROUTINE W REFLEX MICROSCOPIC (NOT AT [facility])  I-STAT BETA HCG BLOOD, ED ([facility], [facility], [facility] ONLY)    Imaging Review No results found. I have personally reviewed and evaluated these images and lab results as part of my medical decision-making.   EKG Interpretation   Date/Time:  Saturday January 16 2016 10:54:10 EDT Ventricular Rate:  77 PR Interval:  198 QRS Duration: 84 QT Interval:  390 QTC Calculation: 441 R Axis:   18 Text Interpretation:  Sinus rhythm No significant change since last  tracing Confirmed by YAO  MD, DAVID ([id number]) on 01/16/2016 11:02:41 AM      MDM   Final diagnoses:  Loss of consciousness   41 year old female who presents with syncope. Her described symptoms are seizure-like. Will recommend Neuro follow up  and recommend no driving until she is cleared by them. Labs show she is slightly more anemic (H&H is 9.2/31.7 today down from 10/32.7 in November) which may explain her fatigue. Rest of labs are unremarkable. Head Ct was negative for acute processes. UA was clean. CK was slightly elevated.  She is also complaining of her BP dropping. She has adjusted her BP meds herself by taking decreased doses. She states her BP will drop when she is just lying in bed. Unsure of the etiology of this since she is not orthostatic. Possibly due to weight loss from her gastric sleeve. Recommend following up with whoever is prescribing her BP meds to have them adjusted. BP remains in the 120s-130s when I am in the room.  Finally she  is requesting a rx for Amitiza for her constipation which she obtained last time she was here. I told her she should be getting this from her PCP who, she says, will not fill it for her. She denies currently being on narcotics. I offered to give her Miralax which she says she already has and does not work for her. I told her I would refill this med this time but she should not be receiving meds refills from the ED in the future. Pt verbalized understanding.  She is NAD, non-toxic, VSS. Patient informed of clinical course, understand medical decision-making process, and agrees with plan.     Recardo Evangelist, PA-C 01/16/16 [place] Yao, MD 01/16/16 [phone]

## 2016-01-16 NOTE — Discharge Instructions (Signed)
Please follow up with whoever is managing your blood pressure medicines to have them adjusted so it doesn't drop  Also, please ask about having them refill your medicine for constipation.

## 2016-01-19 ENCOUNTER — Telehealth: Payer: Self-pay | Admitting: Physician Assistant

## 2016-01-19 NOTE — Telephone Encounter (Addendum)
Pt called EMS over weekend-had a seizure, BP was low, muscles feel weak-wants asap appt-saw Scott last-former [facility] patient-pls advise  [phone]

## 2016-01-19 NOTE — Telephone Encounter (Signed)
Pt seen in ED 4/8 for seizure like activity/loss of consciousness. Pt took BP Saturday before going to ED and it was 94/47. Pt states that she does check her BP daily. Pt concerned about taking Verapimil 120 TID and her BP running low at times. Pt eager to be seen.  Scheduled for tomorrow with Rosaria Ferries, PA-C.

## 2016-01-20 ENCOUNTER — Encounter: Payer: Self-pay | Admitting: Physician Assistant

## 2016-01-20 ENCOUNTER — Ambulatory Visit: Payer: BLUE CROSS/BLUE SHIELD | Admitting: Physician Assistant

## 2016-01-20 ENCOUNTER — Ambulatory Visit (INDEPENDENT_AMBULATORY_CARE_PROVIDER_SITE_OTHER): Payer: BLUE CROSS/BLUE SHIELD | Admitting: Physician Assistant

## 2016-01-20 VITALS — BP 144/60 | HR 86 | Ht 69.0 in | Wt 242.0 lb

## 2016-01-20 DIAGNOSIS — I10 Essential (primary) hypertension: Secondary | ICD-10-CM | POA: Diagnosis not present

## 2016-01-20 DIAGNOSIS — R55 Syncope and collapse: Secondary | ICD-10-CM | POA: Diagnosis not present

## 2016-01-20 MED ORDER — VERAPAMIL HCL 120 MG PO TABS: 120.0000 mg | ORAL_TABLET | Freq: Two times a day (BID) | ORAL | Status: DC

## 2016-01-20 NOTE — Patient Instructions (Signed)
Medication Instructions: Rosaria Ferries, PA-C, has recommended making the following medication changes: 1. DECREASE Verapamil to 1 tablet by mouth TWICE daily.  On days when you work, take 0.5 tablet in the morning and 1 tablet in the evening  Labwork: NONE  Testing/Procedures: NONE  Follow-up: Suanne Marker recommends that you schedule a follow-up appointment in 3 months with Nikayla Madaris Merle, NP.  If you need a refill on your cardiac medications before your next appointment, please call your pharmacy.

## 2016-01-20 NOTE — Progress Notes (Signed)
Cardiology Office Note   Date:  01/20/2016   ID:  Tammie [name], DOB 07/10/1976, MRN [id number]  PCP:  No PCP Per Patient  Cardiologist:  Truitt Merle NP  Rosaria Ferries, PA-C   Chief Complaint  Patient presents with  . Hypertension    History of Present Illness: Tammie [name] is a 41 y.o. female with a history of gastric sleeve, anemia, HTN, PVCs, obesity, GERD, low K+.  04/08, seen in ER for syncope, ?sz, pt to f/u w/ Neurology, no driving.  Tammie [name] presents for evaluation of her syncope.  Tammie [name] next has a tough work schedule. Her work today he is normally 12 or 13 hours and she works anywhere between 4 and 6 days per week. Because of the demands of her work schedule, she does not find time to eat or drink regularly. Since she had the gastric sleeve done and can only take in small amounts, this has been a problem.  She has been getting lightheaded at times. She has not been taking her verapamil 120 mg 3 times a day as directed. However, on days when she does not work, her blood pressure is elevated.  She had an incident on 01/16/2016 where she was difficult to arouse in the morning, had been incontinent of urine, and apparently had bitten her tongue. Her family did not witness any thing but states they hurt her making noises and that's when they went in and tried to arouse her and were unable to do so. She has no history of seizure activity. She was seen in the emergency room after this, and there were no significant abnormalities on her labs. However, they did not check any vitamin levels. She was anemic with a hemoglobin of 9.2 which is lower than previous values. This may be contributing to some feelings of fatigue.  She has never had any episodes like this before and has not had any since. She has not had any palpitations or feeling like her heart was skipping or racing. She has not had any chest pain.   Past Medical History  Diagnosis Date  .  Hypertension   . Fibroid   . Anemia   . Abnormal Pap smear   . Hypopotassemia   . Esophageal reflux   . Palpitations   . Pregnancy induced hypertension   . Dysrhythmia     pvc's  . Asthma     as child  . Obesity     Past Surgical History  Procedure Laterality Date  . Tubal ligation    . Colposcopy    . Oophorectomy      Left  . Laparoscopic gastric sleeve resection      Current Outpatient Prescriptions  Medication Sig Dispense Refill  . albuterol (PROVENTIL HFA;VENTOLIN HFA) 108 (90 BASE) MCG/ACT inhaler Inhale 1-2 puffs into the lungs every 6 (six) hours as needed for wheezing or shortness of breath.    . lubiprostone (AMITIZA) 24 MCG capsule Take 1 capsule (24 mcg total) by mouth 2 (two) times daily with a meal. 20 capsule 0  . verapamil (CALAN) 120 MG tablet Take 120 mg by mouth 3 (three) times daily.     No current facility-administered medications for this visit.    Allergies:   Sulfamethoxazole and Sulfa antibiotics    Social History:  The patient  reports that she has never smoked. She has never used smokeless tobacco. She reports that she does not drink alcohol or use illicit drugs.  Family History:  The patient's family history includes Cancer in her father; Diabetes in her father and maternal grandmother; Hypertension in her father and paternal grandmother; Kidney disease in her father.    ROS:  Please see the history of present illness. All other systems are reviewed and negative.    PHYSICAL EXAM: VS:  BP 144/60 mmHg  Pulse 86  Ht 5\' 9"  (1.753 m)  Wt 242 lb (109.77 kg)  BMI 35.72 kg/m2 , BMI Body mass index is 35.72 kg/(m^2). GEN: Well nourished, well developed, female in no acute distress HEENT: normal for age  Neck: no JVD, no carotid bruit, no masses Cardiac: RRR; soft murmur, no rubs, or gallops Respiratory:  clear to auscultation bilaterally, normal work of breathing GI: soft, nontender, nondistended, + BS MS: no deformity or atrophy; no  edema; distal pulses are 2+ in all 4 extremities  Skin: warm and dry, no rash Neuro:  Strength and sensation are intact Psych: euthymic mood, full affect   EKG:  EKG is not ordered today.  Recent Labs: 09/05/2015: ALT 12* 01/16/2016: BUN 8; Creatinine, Ser 0.69; Hemoglobin 9.2*; Platelets 296; Potassium 4.0; Sodium 137    Lipid Panel    Component Value Date/Time   CHOL 183 06/18/2013 1006   TRIG 75.0 06/18/2013 1006   HDL 42.60 06/18/2013 1006   CHOLHDL 4 06/18/2013 1006   VLDL 15.0 06/18/2013 1006   LDLCALC 125* 06/18/2013 1006     Wt Readings from Last 3 Encounters:  01/20/16 242 lb (109.77 kg)  07/05/15 265 lb (120.203 kg)  02/02/15 294 lb 12.8 oz (133.72 kg)     Other studies Reviewed: Additional studies/ records that were reviewed today include: Office notes and hospital records.  ASSESSMENT AND PLAN:  1.  Syncope/seizure activity: Tammie [name] is not yet made an appointment to see neurology. She is going to follow-up with the doctor that did the gastric sleeve to see if any vitamin levels need to be checked and that may have contributed to this incident. If she does not find any cause working with this particular M.D., she will contact neurology for an appointment.  We discussed the need for improved by mouth intake. I suggested that her colleagues will work with her to make sure she has adequate by mouth intake. She understands the importance of this and will try to improve her by mouth intake.  Because she has not had palpitations and because there is a possibility that the symptoms were secondary to dehydration or inadequate intake versus seizure, we will not get an event monitor at this time. If she develops palpitations or feels that there may be a cardiac cause of her symptoms, she will contact us and we will order the monitor at that time.  2. Essential hypertension: Her blood pressure is actually a bit elevated in the office today. She has taken her verapamil 120 mg  today. She has lost over 50 pounds since the gastric sleeve was placed. I advised that she probably did not need the verapamil 3 times a day. She is to continue taking verapamil 120 mg twice a day on days where she does not work. Because she is probably getting hypotensive on days where she works, she is only going to take a half dose in the morning on days that she works. If she continues to have symptoms, she will contact us.   Current medicines are reviewed at length with the patient today.  The patient has concerns regarding medicines. HER concerns  were addressed  The following changes have been made:  Decreased verapamil as described above  Labs/ tests ordered today include:  No orders of the defined types were placed in this encounter.     Disposition:   FU with Dr. Oval Linsey  Signed, Adriene Padula, [address]  01/20/2016 3:56 PM    [name] Phone: [phone]; Fax: [phone]  This note was written with the assistance of speech recognition software. Please excuse any transcriptional errors.

## 2016-02-08 DIAGNOSIS — I7789 Other specified disorders of arteries and arterioles: Secondary | ICD-10-CM

## 2016-02-08 HISTORY — DX: Other specified disorders of arteries and arterioles: I77.89

## 2016-03-02 IMAGING — CR DG ABDOMEN ACUTE W/ 1V CHEST
4 series · 4 of 4 positions shown · non-contrast
Comparison: CT abdomen and pelvis 06/13/2015.  Chest 04/22/2014

CLINICAL DATA: Gastric sleeve placed 6 weeks ago. Tonight presents
with severe abdominal pain.

EXAM:
DG ABDOMEN ACUTE W/ 1V CHEST

[w chest pa]
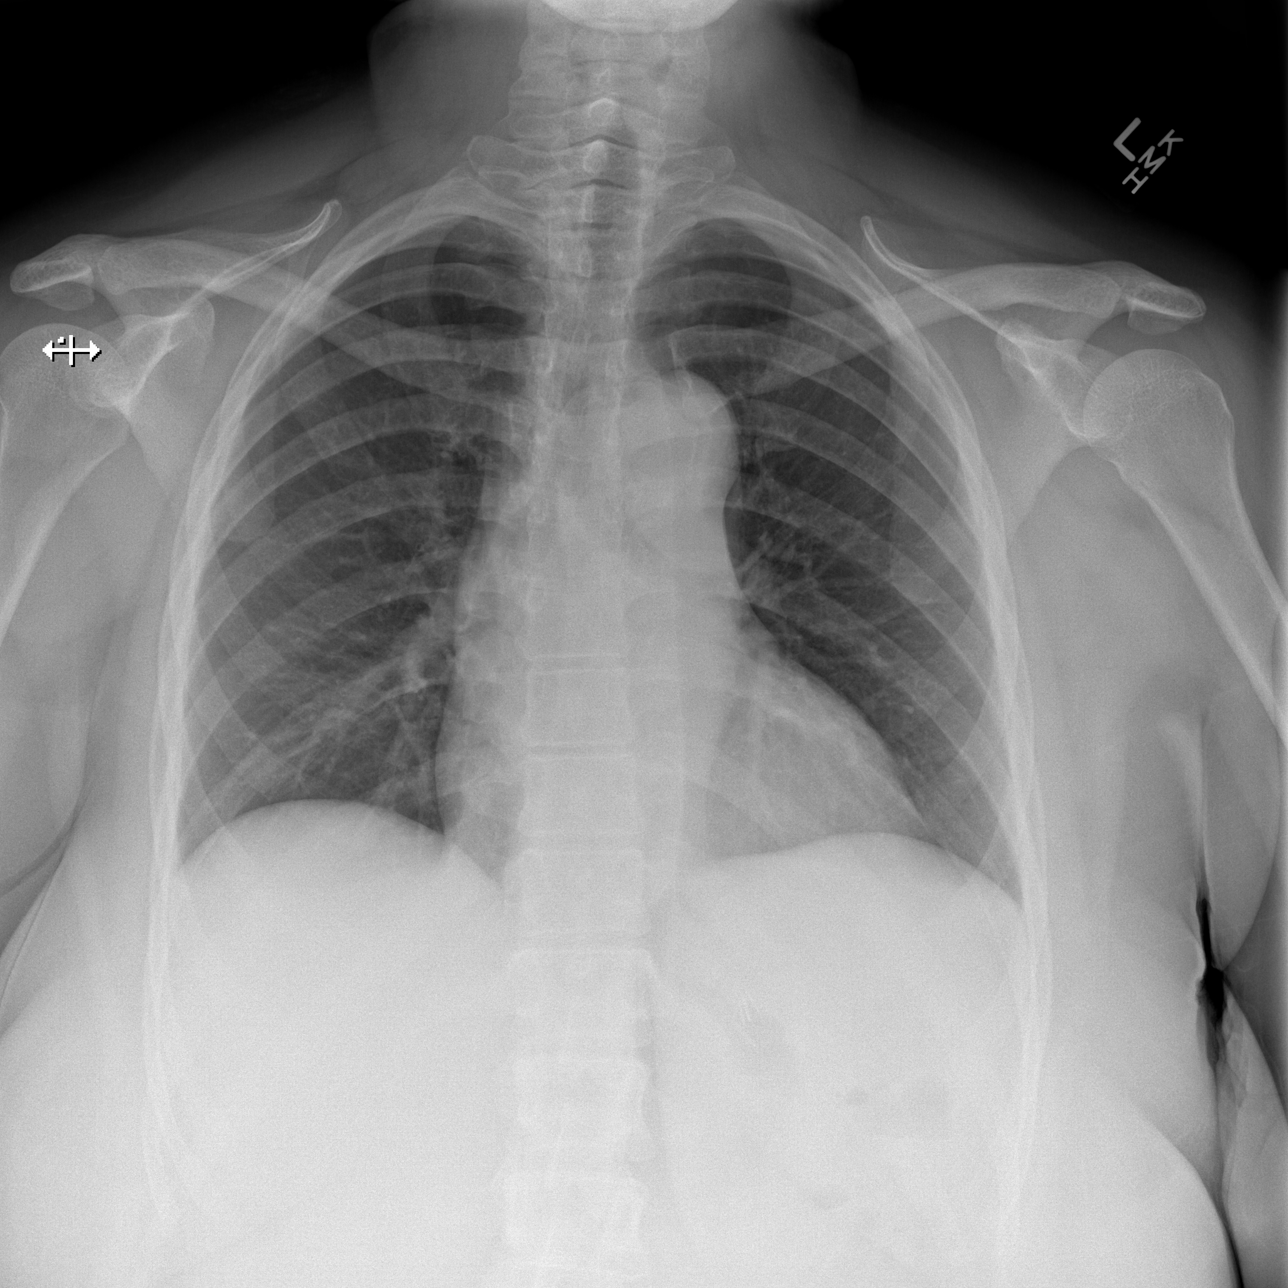

[w abdomen upright]
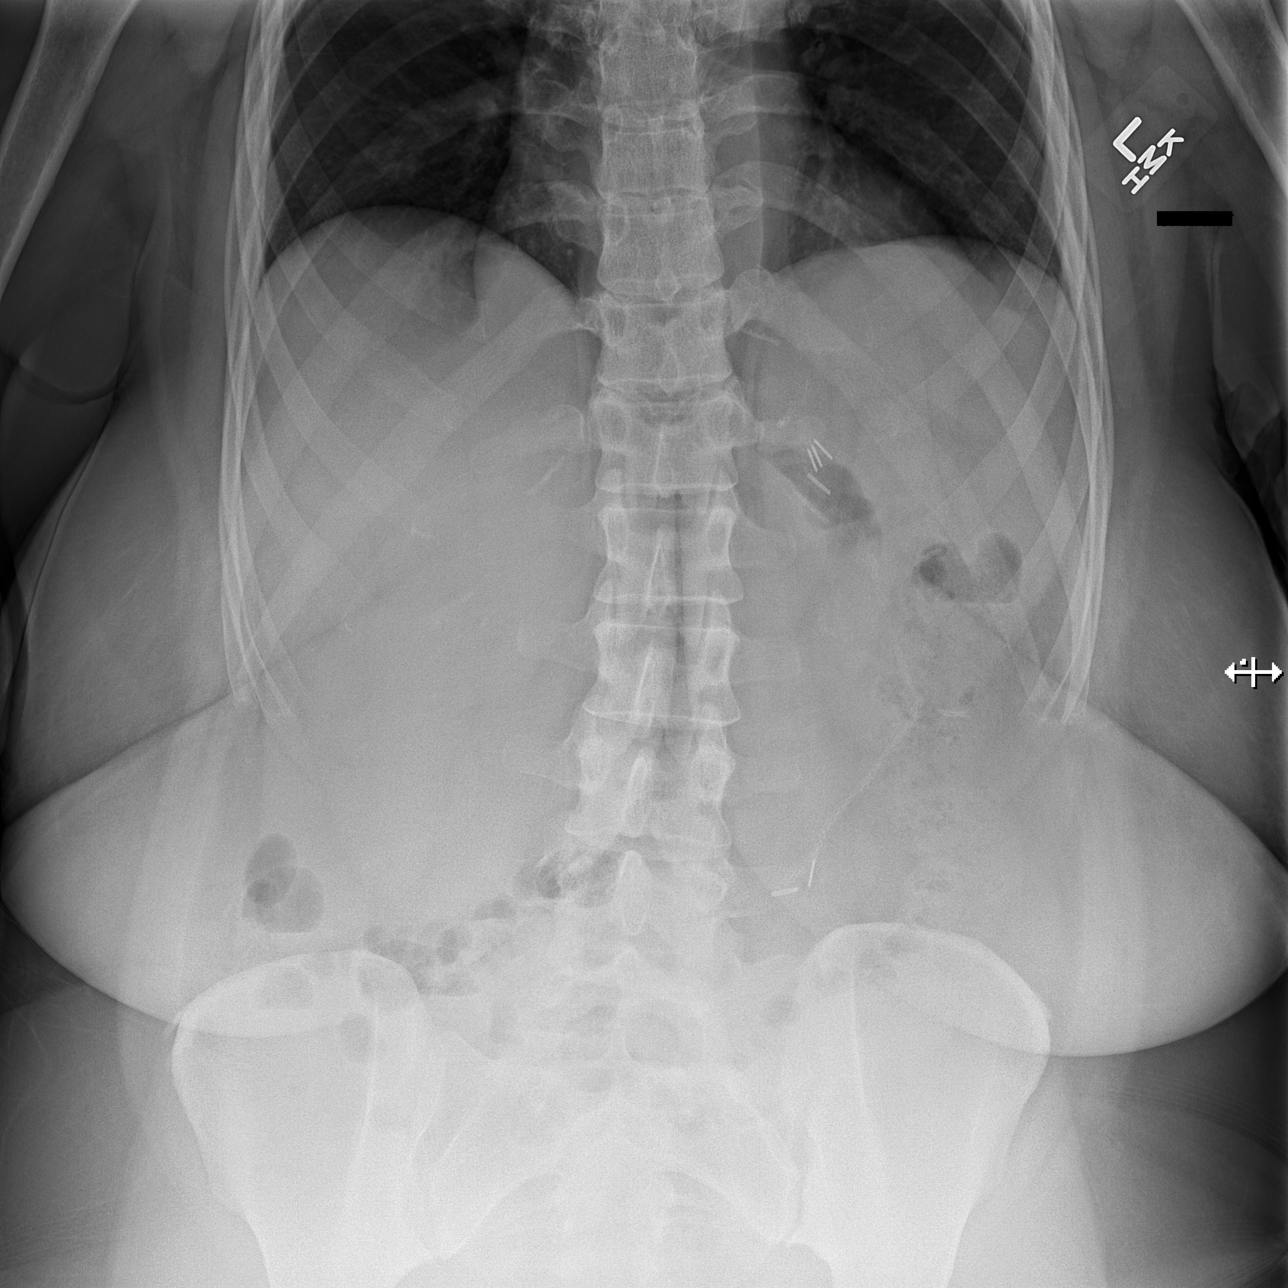

[t abdomen supine (1 of 2)]
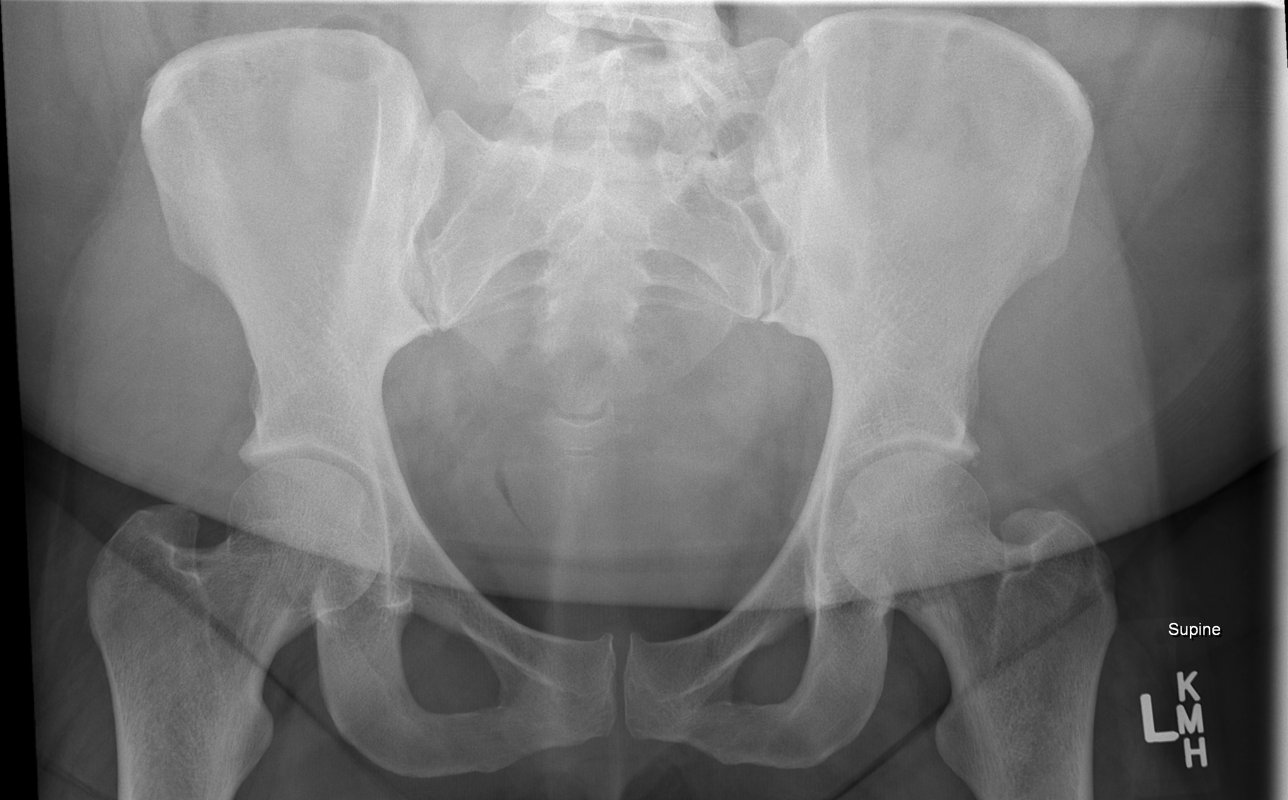

[t abdomen supine (2 of 2)]
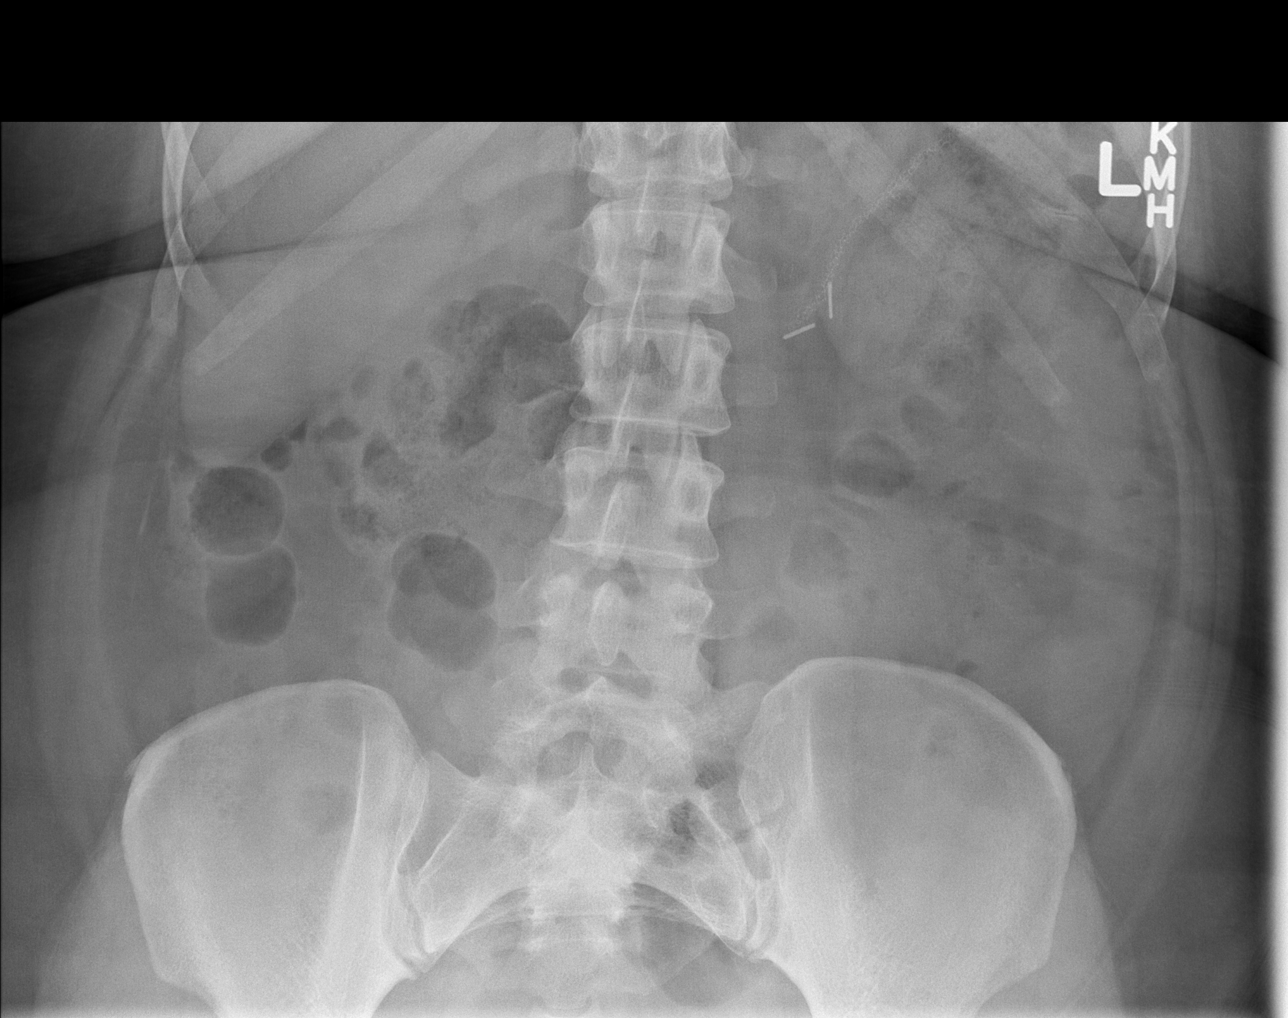

[4 of 4 positions shown; findings below may reference images not displayed]

FINDINGS: Normal heart size and pulmonary vascularity. No focal airspace
disease or consolidation in the lungs. No blunting of costophrenic
angles. No pneumothorax. Mediastinal contours appear intact.

Scattered gas and stool in the colon. No small or large bowel
distention. No free intra-abdominal air. No abnormal air-fluid
levels. No radiopaque stones. Visualized bones appear intact.
Surgical clips in the left abdomen consistent with history of
gastric sleeve procedure.
IMPRESSION: No evidence of active pulmonary disease. Nonobstructive bowel gas
pattern.

## 2016-03-06 ENCOUNTER — Encounter (HOSPITAL_COMMUNITY): Payer: Self-pay | Admitting: Emergency Medicine

## 2016-03-06 ENCOUNTER — Emergency Department (HOSPITAL_COMMUNITY): Payer: BLUE CROSS/BLUE SHIELD

## 2016-03-06 ENCOUNTER — Observation Stay (HOSPITAL_COMMUNITY)
Admission: EM | Admit: 2016-03-06 | Discharge: 2016-03-08 | Disposition: A | Discharge status: Home / Self Care | Payer: BLUE CROSS/BLUE SHIELD | Attending: Internal Medicine | Admitting: Internal Medicine

## 2016-03-06 DIAGNOSIS — R569 Unspecified convulsions: Secondary | ICD-10-CM | POA: Diagnosis not present

## 2016-03-06 DIAGNOSIS — G40409 Other generalized epilepsy and epileptic syndromes, not intractable, without status epilepticus: Secondary | ICD-10-CM | POA: Diagnosis not present

## 2016-03-06 DIAGNOSIS — I10 Essential (primary) hypertension: Secondary | ICD-10-CM | POA: Diagnosis not present

## 2016-03-06 DIAGNOSIS — Z6834 Body mass index (BMI) 34.0-34.9, adult: Secondary | ICD-10-CM | POA: Diagnosis not present

## 2016-03-06 DIAGNOSIS — I4891 Unspecified atrial fibrillation: Secondary | ICD-10-CM | POA: Insufficient documentation

## 2016-03-06 DIAGNOSIS — Z9884 Bariatric surgery status: Secondary | ICD-10-CM | POA: Insufficient documentation

## 2016-03-06 DIAGNOSIS — E669 Obesity, unspecified: Secondary | ICD-10-CM | POA: Insufficient documentation

## 2016-03-06 LAB — POC URINE PREG, ED: Preg Test, Ur: NEGATIVE

## 2016-03-06 LAB — RAPID URINE DRUG SCREEN, HOSP PERFORMED
AMPHETAMINES: NOT DETECTED
Barbiturates: NOT DETECTED
Benzodiazepines: NOT DETECTED
Cocaine: NOT DETECTED
OPIATES: NOT DETECTED
Tetrahydrocannabinol: NOT DETECTED

## 2016-03-06 LAB — BASIC METABOLIC PANEL
Anion gap: 5 (ref 5–15)
BUN: 12 mg/dL (ref 6–20)
CHLORIDE: 104 mmol/L (ref 101–111)
CO2: 27 mmol/L (ref 22–32)
CREATININE: 0.73 mg/dL (ref 0.44–1.00)
Calcium: 8.8 mg/dL — ABNORMAL LOW (ref 8.9–10.3)
GFR calc Af Amer: >60 mL/min (ref >60)
GFR calc non Af Amer: >60 mL/min (ref >60)
GLUCOSE: 90 mg/dL (ref 65–99)
Potassium: 3.7 mmol/L (ref 3.5–5.1)
Sodium: 136 mmol/L (ref 135–145)

## 2016-03-06 LAB — MAGNESIUM: Magnesium: 1.7 mg/dL (ref 1.7–2.4)

## 2016-03-06 LAB — HEPATIC FUNCTION PANEL
ALBUMIN: 2.9 g/dL — AB (ref 3.5–5.0)
ALT: 13 U/L — AB (ref 14–54)
AST: 18 U/L (ref 15–41)
Alkaline Phosphatase: 47 U/L (ref 38–126)
BILIRUBIN TOTAL: 0.3 mg/dL (ref 0.3–1.2)
Total Protein: 6.7 g/dL (ref 6.5–8.1)

## 2016-03-06 LAB — TSH: TSH: 0.561 u[IU]/mL (ref 0.350–4.500)

## 2016-03-06 LAB — URINALYSIS, ROUTINE W REFLEX MICROSCOPIC
Bilirubin Urine: NEGATIVE
Glucose, UA: NEGATIVE mg/dL
HGB URINE DIPSTICK: NEGATIVE
Ketones, ur: NEGATIVE mg/dL
LEUKOCYTES UA: NEGATIVE
Nitrite: NEGATIVE
PROTEIN: NEGATIVE mg/dL
SPECIFIC GRAVITY, URINE: 1.014 (ref 1.005–1.030)
pH: 8 (ref 5.0–8.0)

## 2016-03-06 LAB — CBC
HCT: 31.1 % — ABNORMAL LOW (ref 36.0–46.0)
Hemoglobin: 9.1 g/dL — ABNORMAL LOW (ref 12.0–15.0)
MCH: 21.7 pg — AB (ref 26.0–34.0)
MCHC: 29.3 g/dL — AB (ref 30.0–36.0)
MCV: 74 fL — AB (ref 78.0–100.0)
PLATELETS: 364 10*3/uL (ref 150–400)
RBC: 4.2 MIL/uL (ref 3.87–5.11)
RDW: 15.6 % — AB (ref 11.5–15.5)
WBC: 10.1 10*3/uL (ref 4.0–10.5)

## 2016-03-06 LAB — PHOSPHORUS: PHOSPHORUS: 3.3 mg/dL (ref 2.5–4.6)

## 2016-03-06 LAB — T4, FREE: FREE T4: 0.99 ng/dL (ref 0.61–1.12)

## 2016-03-06 LAB — CBG MONITORING, ED: Glucose-Capillary: 88 mg/dL (ref 65–99)

## 2016-03-06 LAB — CK: Total CK: 354 U/L — ABNORMAL HIGH (ref 38–234)

## 2016-03-06 LAB — TROPONIN I: TROPONIN I: 0.03 ng/mL (ref <0.031)

## 2016-03-06 MED ORDER — VERAPAMIL HCL 120 MG PO TABS
60.0000 mg | ORAL_TABLET | Freq: Three times a day (TID) | ORAL | Status: DC
  Administered 2016-03-06: 60 mg via ORAL
  Filled 2016-03-06 (×4): qty 0.5

## 2016-03-06 MED ORDER — LEVETIRACETAM 500 MG PO TABS
500.0000 mg | ORAL_TABLET | Freq: Two times a day (BID) | ORAL | Status: DC
  Administered 2016-03-06 – 2016-03-08 (×4): 500 mg via ORAL
  Filled 2016-03-06 (×4): qty 1

## 2016-03-06 MED ORDER — ACETAMINOPHEN 325 MG PO TABS
650.0000 mg | ORAL_TABLET | ORAL | Status: DC | PRN
  Administered 2016-03-06 – 2016-03-07 (×2): 650 mg via ORAL
  Filled 2016-03-06 (×2): qty 2

## 2016-03-06 MED ORDER — MAGNESIUM SULFATE 2 GM/50ML IV SOLN
2.0000 g | Freq: Once | INTRAVENOUS | Status: AC
  Administered 2016-03-06: 2 g via INTRAVENOUS
  Filled 2016-03-06: qty 50

## 2016-03-06 MED ORDER — ONDANSETRON HCL 4 MG/2ML IJ SOLN: 4.0000 mg | Freq: Four times a day (QID) | INTRAMUSCULAR | Status: DC | PRN

## 2016-03-06 MED ORDER — LORAZEPAM 2 MG/ML IJ SOLN: 1.0000 mg | INTRAMUSCULAR | Status: DC | PRN

## 2016-03-06 MED ORDER — SODIUM CHLORIDE 0.9 % IV SOLN
75.0000 mL/h | INTRAVENOUS | Status: DC
  Administered 2016-03-06 – 2016-03-07 (×2): 75 mL/h via INTRAVENOUS

## 2016-03-06 MED ORDER — ENOXAPARIN SODIUM 120 MG/0.8ML ~~LOC~~ SOLN
110.0000 mg | Freq: Two times a day (BID) | SUBCUTANEOUS | Status: DC
  Administered 2016-03-06 – 2016-03-07 (×3): 110 mg via SUBCUTANEOUS
  Filled 2016-03-06 (×2): qty 0.73
  Filled 2016-03-06: qty 0.8

## 2016-03-06 MED ORDER — VERAPAMIL HCL 80 MG PO TABS: 80.0000 mg | ORAL_TABLET | Freq: Three times a day (TID) | ORAL | Status: DC

## 2016-03-06 MED ORDER — SODIUM CHLORIDE 0.9 % IV SOLN
1000.0000 mg | Freq: Once | INTRAVENOUS | Status: AC
  Administered 2016-03-06: 1000 mg via INTRAVENOUS
  Filled 2016-03-06: qty 10

## 2016-03-06 MED ORDER — SENNOSIDES-DOCUSATE SODIUM 8.6-50 MG PO TABS
1.0000 | ORAL_TABLET | Freq: Every evening | ORAL | Status: DC | PRN
  Administered 2016-03-06: 1 via ORAL
  Filled 2016-03-06: qty 1

## 2016-03-06 MED ORDER — ONDANSETRON HCL 4 MG PO TABS: 4.0000 mg | ORAL_TABLET | Freq: Four times a day (QID) | ORAL | Status: DC | PRN

## 2016-03-06 MED ORDER — SODIUM CHLORIDE 0.9 % IV BOLUS (SEPSIS)
1000.0000 mL | Freq: Once | INTRAVENOUS | Status: AC
  Administered 2016-03-06: 1000 mL via INTRAVENOUS

## 2016-03-06 MED ORDER — ALBUTEROL SULFATE (2.5 MG/3ML) 0.083% IN NEBU
2.5000 mg | INHALATION_SOLUTION | RESPIRATORY_TRACT | Status: DC | PRN
Start: 2016-03-06 — End: 2016-03-08

## 2016-03-06 MED ORDER — ACETAMINOPHEN 650 MG RE SUPP: 650.0000 mg | RECTAL | Status: DC | PRN

## 2016-03-06 NOTE — Consult Note (Addendum)
Neurology Consultation Reason for Consult: Seizures Referring Physician: Maryan Rued, W  CC: Seizures  History is obtained from: Patient, family  HPI: Tammie [name] is a 40 y.o. female with a history of a single previous episode concerning for seizure. Apparently she was making some type of vocalization and by the time that family saw her, she had wet the bed and bitten her tongue. She was quite confused when they found her with gradual improvement. Today, her mother states that she went to wake her up and she was difficult to arouse. When she did begin to get up they noticed that there was blood on her pillow and that she had wet the bed. She was very fatigued, but was brought into the hospital.  Here, she was found to have atrial fibrillation but otherwise be back to her normal self.   LKW: 5/27 prior to bed tpa given?: no, out of window    ROS: A 14 point ROS was performed and is negative except as noted in the HPI.  Past Medical History  Diagnosis Date  . Hypertension   . Fibroid   . Anemia   . Abnormal Pap smear   . Hypopotassemia   . Esophageal reflux   . Palpitations   . Pregnancy induced hypertension   . Dysrhythmia     pvc's  . Asthma     as child  . Obesity      Family History  Problem Relation Age of Onset  . Hypertension Father   . Kidney disease Father   . Cancer Father   . Diabetes Father   . Diabetes Maternal Grandmother   . Hypertension Paternal Grandmother   . Epilepsy Cousin   . Epilepsy Maternal Aunt   . Epilepsy Maternal Uncle      Social History:  reports that she has never smoked. She has never used smokeless tobacco. She reports that she drinks alcohol. She reports that she does not use illicit drugs.   Exam: Current vital signs: BP 141/80 mmHg  Pulse 111  Temp(Src) 98.4 F (36.9 C) (Oral)  Resp 11  Ht 5\' 10"  (1.778 m)  Wt 108.41 kg (239 lb)  BMI 34.29 kg/m2  SpO2 99%  LMP 02/24/2016 Vital signs in last 24 hours: Temp:   [98.4 F (36.9 C)] 98.4 F (36.9 C) (05/28 0828) Pulse Rate:  [95-111] 111 (05/28 1030) Resp:  [11-20] 11 (05/28 1030) BP: (132-147)/(79-92) 141/80 mmHg (05/28 1030) SpO2:  [97 %-99 %] 99 % (05/28 1030) Weight:  [108.41 kg (239 lb)] 108.41 kg (239 lb) (05/28 [id number])   Physical Exam  Constitutional: Appears well-developed and well-nourished.  Psych: Affect appropriate to situation Eyes: No scleral injection HENT: Tongue with evidence of bites on both sides along the lateral aspect as would be consistent with seizure. Head: Normocephalic.  Cardiovascular: Normal rate and regular rhythm.  Respiratory: Effort normal and breath sounds normal to anterior ascultation GI: Soft.  No distension. There is no tenderness.  Skin: WDI  Neuro: Mental Status: Patient is awake, alert, oriented to person, place, month, year, and situation. Patient is able to give a clear and coherent history. No signs of aphasia or neglect Cranial Nerves: II: Visual Fields are full. Pupils are equal, round, and reactive to light.   III,IV, VI: EOMI without ptosis or diploplia.  V: Facial sensation is symmetric to temperature VII: Facial movement is symmetric.  VIII: hearing is intact to voice X: Uvula elevates symmetrically XI: Shoulder shrug is symmetric. XII: tongue is midline  without atrophy or fasciculations.  Motor: Tone is normal. Bulk is normal. 5/5 strength was present in all four extremities.  Sensory: Sensation is symmetric to light touch and temperature in the arms and legs. Cerebellar: FNF intact bilaterally   I have reviewed labs in epic and the results pertinent to this consultation are: BMP-unremarkable  I have reviewed the images obtained: CT head-unremarkable  Impression: 40 year old female with 2 events which as described are fairly convincing for unwitnessed seizures with tongue biting, urinary incontinence, postictal confusion. I would favor starting antiepileptic therapy at this time.  Atrial fibrillation can occur after physiological stressors and seizures are certainly a significant physiological stressor. My suspicion is the atrial fibrillation as a result of the seizure as opposed to atrial fibrillation starting first, causing small ischemic infarct with resultant seizure.  In any case, I would favor starting antiepileptic therapy at this time.  Recommendations: 1) Keppra 500 mg twice a day following 1 g load 2) MRI brain 3) EEG 4) neurology will continue to follow   Roland Rack, MD [facility] [phone]  If 7pm- 7am, please page neurology on call as listed in [place].

## 2016-03-06 NOTE — H&P (Signed)
Date: 03/06/2016               Patient Name:  Tammie [name] MRN: [id number]  DOB: 07/20/1976 Age / Sex: 40 y.o., female   PCP: No Pcp Per Patient         Medical Service: Internal Medicine Teaching Service         Attending Physician: Dr. Blanchie Dessert, MD    First Contact: Dr. Blane Ohara Pager: [phone]  Second Contact: Dr. Jacques Earthly Pager: [phone]       After Hours (After 5p/  First Contact Pager: [phone]  weekends / holidays): Second Contact Pager: [phone]   Chief Complaint: Seizure  History of Present Illness: [name] is a 40 year old with a history of gastric sleeve complicated by anemia and hypokalemia, HTN, and one previous seizure 7 weeks ago who presents with a recurrent seizure. This morning, she woke up and both she and her family noticed that her pillow was soaked in blood and her bed was wet with urine, and she had bit her tongue. She also noted a sore throat and diffuse muscle aches when she woke up this morning. She felt completely normal and she went to bed last night. She says this is very similar to the episode she had at the beginning of last month. Currently, she notes some fatigue and intermittent palpitations, without other associated symptoms, such as recent illness, chest pain, shortness of breath, nausea, vomiting, abdominal pain, headache, vision changes, focal numbness or weakness, or confusion. She has lost over 50 pounds since the sleeve was placed. 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 She denies any other stressors, tobacco or alcohol abuse or other recreational drug use. She has not had any seizures in between the episode on April 8 and today. She has a cousin, an aunt and an uncle who all have a history of seizures. She has no other complaints at this time.  Meds: Current Facility-Administered Medications    Medication Dose Route Frequency Provider Last Rate Last Dose  . levETIRAcetam (KEPPRA) tablet 500 mg  500 mg Oral BID Maryan Puls, MD       Current Outpatient Prescriptions  Medication Sig Dispense Refill  . albuterol (PROVENTIL HFA;VENTOLIN HFA) 108 (90 BASE) MCG/ACT inhaler Inhale 1-2 puffs into the lungs every 6 (six) hours as needed for wheezing or shortness of breath.    . Calcium-Vitamin D-Vitamin K (CALCIUM SOFT CHEWS PO) Take 1 tablet by mouth 3 (three) times daily. Take 1 chew tablet by mouth three times daily    . lubiprostone (AMITIZA) 24 MCG capsule Take 1 capsule (24 mcg total) by mouth 2 (two) times daily with a meal. 20 capsule 0  . verapamil (CALAN) 120 MG tablet Take 1 tablet (120 mg total) by mouth 2 (two) times daily. 60 tablet 11  . vitamin B-12 (CYANOCOBALAMIN) 100 MCG tablet Take 1 tablet by mouth daily.      Allergies: Allergies as of 03/06/2016 - Review Complete 03/06/2016  Allergen Reaction Noted  . Sulfamethoxazole Anaphylaxis 08/22/2014  . Sulfa antibiotics Rash and Other (See Comments) 05/06/2011   Past Medical History  Diagnosis Date  . Hypertension   . Fibroid   . Anemia   . Abnormal Pap smear   . Hypopotassemia   . Esophageal reflux   . Palpitations   . Pregnancy  induced hypertension   . Dysrhythmia     pvc's  . Asthma     as child  . Obesity    Past Surgical History  Procedure Laterality Date  . Tubal ligation    . Colposcopy    . Oophorectomy      Left  . Laparoscopic gastric sleeve resection     Family History  Problem Relation Age of Onset  . Hypertension Father   . Kidney disease Father   . Cancer Father   . Diabetes Father   . Diabetes Maternal Grandmother   . Hypertension Paternal Grandmother    Social History   Social History  . Marital Status: Single    Spouse Name: N/A  . Number of Children: N/A  . Years of Education: N/A   Occupational History  . Not on file.   Social History Main Topics  . Smoking status:  Never Smoker   . Smokeless tobacco: Never Used  . Alcohol Use: Yes     Comment: drank last night; does not usually drink  . Drug Use: No  . Sexual Activity: Yes    Birth Control/ Protection: Condom, Surgical   Other Topics Concern  . Not on file   Social History Narrative    Review of Systems: Pertinent items noted in HPI and remainder of comprehensive ROS otherwise negative.  Physical Exam: Blood pressure 141/80, pulse 111, temperature 98.4 F (36.9 C), temperature source Oral, resp. rate 11, height 5\' 10"  (1.778 m), weight 239 lb (108.41 kg), last menstrual period 02/24/2016, SpO2 99 %.   Gen: Well-appearing, alert and oriented to person, place, and time HEENT: Tongue with scant dried blood. Teeth without blood. Neck: No cervical LAD, no thyromegaly or nodules, no JVD noted. CV: Normal rate, regular rhythm, no murmurs, rubs, or gallops Pulmonary: Normal effort, CTA bilaterally, no crackles or wheezes Abdominal: Soft, non-tender, non-distended, without rebound, guarding, or masses Extremities: Distal pulses 2+ in upper and lower extremities bilaterally, no tenderness, erythema or edema Neuro: CN II-XII grossly intact, 5 out of 5 strength in upper and lower extremities bilaterally. No finger to nose dysmetria, no dysdiadochokinesia, no focal sensory deficits noted. Skin: No atypical appearing moles. No rashes  Lab results: Basic Metabolic Panel:  Recent Labs  03/06/16 0853  NA 136  K 3.7  CL 104  CO2 27  GLUCOSE 90  BUN 12  CREATININE 0.73  CALCIUM 8.8*   CBC:  Recent Labs  03/06/16 0853  WBC 10.1  HGB 9.1*  HCT 31.1*  MCV 74.0*  PLT 364   Cardiac Enzymes:  Recent Labs  03/06/16 0853  CKTOTAL 354*   CBG:  Recent Labs  03/06/16 0859  GLUCAP 88   Thyroid Function Tests:  Recent Labs  03/06/16 0946  TSH 0.561  FREET4 0.99   Urine Drug Screen: Drugs of Abuse     Component Value Date/Time   LABOPIA NONE DETECTED 03/06/2016 0943    COCAINSCRNUR NONE DETECTED 03/06/2016 0943   LABBENZ NONE DETECTED 03/06/2016 0943   AMPHETMU NONE DETECTED 03/06/2016 0943   THCU NONE DETECTED 03/06/2016 0943   LABBARB NONE DETECTED 03/06/2016 0943   Imaging results:  Dg Chest 2 View  03/06/2016  CLINICAL DATA:  Patient with diffuse body aches and weakness. Possible seizure. EXAM: CHEST  2 VIEW COMPARISON:  Chest radiograph 07/05/2015. FINDINGS: Stable enlarged cardiac and mediastinal contours. No consolidative pulmonary opacities. No pleural effusion or pneumothorax. Thoracic spine degenerative changes. IMPRESSION: No active cardiopulmonary disease. Electronically Signed  By: Lovey Newcomer M.D.   On: 03/06/2016 10:13   Other results: EKG: atrial fibrillation, rate 119  Assessment & Plan by Problem: 1. Seizure - this is now the patient second seizure. She is not on epileptic medications currently. She was arranged to follow-up in the neurology clinic after her last episode, but has been unable to do so yet. Patient without focal neurologic deficits, only postictal symptoms are myalgias, throat pain. CT head in April was negative. UDS, TSH, Upreg all negative Possible precipitants include significant stress through work and possibly vitamin deficiencies through her gastric sleeve. Patient without recent illness or meningeal signs with normal mental status so meningoencephalitis is unlikely and without focal neurologic deficits so CVA is also unlikely. -Neurology following; appreciate the thoughtful care of this mutual patient -MRI, EEG -Further workup as recommended by neurology -Keppra IV loaded initially, continue PO on discharge -Check LFT, magnesium, phos, repeat CBC and BMP in the a.m. replace when necessary -Seizure precautions  2. New atrial fibrillation - currently with palpitations, otherwise HDS. Possibly precipitated by seizure episode or vice-versa. CHADS-VASC of 2 (female, HTN), so technically would be a candidate for  anticoagulation on discharge. -Restart home verapamil -TTE -Lovenox per pharm  3. HTN -Restart home verapamil  Dispo: Disposition is deferred at this time, awaiting improvement of current medical problems. Anticipated discharge in approximately 1-2 day(s).   The patient does not have a current PCP (No Pcp Per Patient) and does need an [facility] follow-up appointment after discharge.  The patient does have transportation limitations that hinder transportation to clinic appointments.  Signed: [name], MD 03/06/2016, 11:13 AM

## 2016-03-06 NOTE — Progress Notes (Signed)
ANTICOAGULATION CONSULT NOTE - Initial Consult  Pharmacy Consult for Lovenox  Indication: atrial fibrillation  Allergies  Allergen Reactions  . Sulfamethoxazole Anaphylaxis  . Sulfa Antibiotics Rash and Other (See Comments)    "Burning"    Patient Measurements: Height: 5\' 10"  (177.8 cm) Weight: 239 lb (108.41 kg) IBW/kg (Calculated) : 68.5 Heparin Dosing Weight: 90 kg   Vital Signs: Temp: 98.4 F (36.9 C) (05/28 0828) Temp Source: Oral (05/28 0828) BP: 141/80 mmHg (05/28 1030) Pulse Rate: 111 (05/28 1030)  Labs:  Recent Labs  03/06/16 0853  HGB 9.1*  HCT 31.1*  PLT 364  CREATININE 0.73  CKTOTAL 354*    Estimated Creatinine Clearance: 127.2 mL/min (by C-G formula based on Cr of 0.73).   Medical History: Past Medical History  Diagnosis Date  . Hypertension   . Fibroid   . Anemia   . Abnormal Pap smear   . Hypopotassemia   . Esophageal reflux   . Palpitations   . Pregnancy induced hypertension   . Dysrhythmia     pvc's  . Asthma     as child  . Obesity     Medications:   (Not in a hospital admission)  Assessment: 63 YOF who presented with a seizure and new Afib to start Subq Lovenox. CHADSVASC score of 2. H/H low, Plt wnl. CrCl > 100 mL/min   Goal of Therapy:  Heparin level 0.3-0.7 units/ml Monitor platelets by anticoagulation protocol: Yes   Plan:  -Start Lovenox 110 mg (0.5 mg/kg) SQ twice daily  -Monitor Q 72 h CBC and s/s of bleeding -F/u transition to oral anticoagulation   Albertina Parr, PharmD., BCPS Clinical Pharmacist Pager [phone]

## 2016-03-06 NOTE — ED Notes (Signed)
Patient transported to X-ray 

## 2016-03-06 NOTE — ED Notes (Addendum)
From home via EMS: When mother woke her up this morning, her bed was wet with urine and there was blood on her pillow. She noted an injury to her tongue and feels like all her muscles are aching. Arrives drowsy, easily arouseable. Oriented x 4. Has had one seizure in the past two months ago; not on any medication. Reports feeling palpitations; on monitor irregular, 122 rate.

## 2016-03-06 NOTE — ED Notes (Signed)
Patient transported to MRI 

## 2016-03-06 NOTE — ED Provider Notes (Signed)
CSN: [id number]     Arrival date & time 03/06/16  0818 History   First MD Initiated Contact with Patient 03/06/16 [phone]     Chief Complaint  Patient presents with  . Seizures     (Consider location/radiation/quality/duration/timing/severity/associated sxs/prior Treatment) Patient is a 40 y.o. female presenting with general illness. The history is provided by the patient, medical records and a relative.  Illness Location:  Seizure Severity:  Moderate Onset quality:  Sudden Duration: Unknown. Timing:  Sporadic Progression:  Resolved Chronicity:  Recurrent Context:  Patient presented 1 month ago with seizure activity. CT head at that time - no acute findings. Supposed to follow up with neuro as an outpatient, hasn't done that. Believed to have had another sz this AM. Found by family - blood per mouth, tongue biting, wet the bed. Took 54m  to return to baseline. No HA or vision changes. No f/c. No other focal sx.  Associated symptoms: fatigue and myalgias   Associated symptoms: no abdominal pain, no chest pain, no cough, no diarrhea, no fever, no headaches, no nausea, no rash, no shortness of breath and no vomiting     Past Medical History  Diagnosis Date  . Hypertension   . Fibroid   . Anemia   . Abnormal Pap smear   . Hypopotassemia   . Esophageal reflux   . Palpitations   . Pregnancy induced hypertension   . Dysrhythmia     pvc's  . Asthma     as child  . Obesity    Past Surgical History  Procedure Laterality Date  . Tubal ligation    . Colposcopy    . Oophorectomy      Left  . Laparoscopic gastric sleeve resection     Family History  Problem Relation Age of Onset  . Hypertension Father   . Kidney disease Father   . Cancer Father   . Diabetes Father   . Diabetes Maternal Grandmother   . Hypertension Paternal Grandmother    Social History  Substance Use Topics  . Smoking status: Never Smoker   . Smokeless tobacco: Never Used  . Alcohol Use: Yes     Comment:  drank last night; does not usually drink   OB History    Gravida Para Term Preterm AB TAB SAB Ectopic Multiple Living   2 2 2       2      Review of Systems  Constitutional: Positive for fatigue. Negative for fever and chills.  HENT:       Tongue biting  Eyes: Negative for visual disturbance.  Respiratory: Negative for cough and shortness of breath.   Cardiovascular: Negative for chest pain.  Gastrointestinal: Negative for nausea, vomiting, abdominal pain, diarrhea and blood in stool.  Genitourinary: Negative for dysuria, vaginal bleeding and vaginal discharge.       Enuresis  Musculoskeletal: Positive for myalgias. Negative for back pain, neck pain and neck stiffness.  Skin: Negative for rash.  Neurological: Positive for seizures. Negative for facial asymmetry, speech difficulty, weakness, light-headedness, numbness and headaches.  All other systems reviewed and are negative.     Allergies  Sulfamethoxazole and Sulfa antibiotics  Home Medications   Prior to Admission medications   Medication Sig Start Date End Date Taking? Authorizing Provider  albuterol (PROVENTIL HFA;VENTOLIN HFA) 108 (90 BASE) MCG/ACT inhaler Inhale 1-2 puffs into the lungs every 6 (six) hours as needed for wheezing or shortness of breath.    Historical Provider, MD  Calcium-Vitamin D-Vitamin K (  CALCIUM SOFT CHEWS PO) Take 1 tablet by mouth 3 (three) times daily. Take 1 chew tablet by mouth three times daily    Historical Provider, MD  lubiprostone (AMITIZA) 24 MCG capsule Take 1 capsule (24 mcg total) by mouth 2 (two) times daily with a meal. 01/16/16   Recardo Evangelist, PA-C  verapamil (CALAN) 120 MG tablet Take 1 tablet (120 mg total) by mouth 2 (two) times daily. 01/20/16   Rhonda G Barrett, PA-C  vitamin B-12 (CYANOCOBALAMIN) 100 MCG tablet Take 1 tablet by mouth daily.    Historical Provider, MD   BP 132/92 mmHg  Pulse 111  Temp(Src) 98.4 F (36.9 C) (Oral)  Resp 12  Ht 5\' 10"  (1.778 m)  Wt 108.41  kg  BMI 34.29 kg/m2  SpO2 97%  LMP 02/24/2016 Physical Exam  Constitutional: She is oriented to person, place, and time. She appears well-developed and well-nourished. No distress.  AAOx3 Appears fatigued  HENT:  Head: Normocephalic and atraumatic.  Small laceration on the right lateral aspect of tongue. No active bleeding. Not through and through.   Eyes: EOM are normal. Pupils are equal, round, and reactive to light.  Neck: Normal range of motion. No rigidity. Normal range of motion present.  Cardiovascular: An irregularly irregular rhythm present. Tachycardia present.   Pulmonary/Chest: No tachypnea. No respiratory distress.  Abdominal: Soft. Normal appearance. There is no tenderness. There is no rebound, no guarding and no CVA tenderness.  Musculoskeletal:       Right lower leg: She exhibits no swelling and no edema.       Left lower leg: She exhibits no swelling and no edema.  Neurological: She is alert and oriented to person, place, and time. She has normal strength. No cranial nerve deficit or sensory deficit. Coordination normal. GCS eye subscore is 4. GCS verbal subscore is 5. GCS motor subscore is 6.  Skin: Skin is warm and dry.    ED Course  Procedures (including critical care time) Labs Review Labs Reviewed  BASIC METABOLIC PANEL - Abnormal; Notable for the following:    Calcium 8.8 (*)    All other components within normal limits  CBC - Abnormal; Notable for the following:    Hemoglobin 9.1 (*)    HCT 31.1 (*)    MCV 74.0 (*)    MCH 21.7 (*)    MCHC 29.3 (*)    RDW 15.6 (*)    All other components within normal limits  CK - Abnormal; Notable for the following:    Total CK 354 (*)    All other components within normal limits  URINE RAPID DRUG SCREEN, HOSP PERFORMED  TSH  T4, FREE  HEPATIC FUNCTION PANEL  MAGNESIUM  PHOSPHORUS  HIV ANTIBODY (ROUTINE TESTING)  URINALYSIS, ROUTINE W REFLEX MICROSCOPIC (NOT AT [facility])  TROPONIN I  CBG MONITORING, ED  POC URINE  PREG, ED    Imaging Review Dg Chest 2 View  03/06/2016  CLINICAL DATA:  Patient with diffuse body aches and weakness. Possible seizure. EXAM: CHEST  2 VIEW COMPARISON:  Chest radiograph 07/05/2015. FINDINGS: Stable enlarged cardiac and mediastinal contours. No consolidative pulmonary opacities. No pleural effusion or pneumothorax. Thoracic spine degenerative changes. IMPRESSION: No active cardiopulmonary disease. Electronically Signed   By: Lovey Newcomer M.D.   On: 03/06/2016 10:13   I have personally reviewed and evaluated these images and lab results as part of my medical decision-making.   EKG Interpretation   Date/Time:  Sunday Mar 06 2016 08:30:38 EDT  Ventricular Rate:  119 PR Interval:    QRS Duration: 83 QT Interval:  327 QTC Calculation: 460 R Axis:   56 Text Interpretation:  new Atrial fibrillation Confirmed by Maryan Rued  MD,  Loree Fee ([id number]) on 03/06/2016 9:04:54 AM      MDM   Final diagnoses:  Atrial fibrillation, unspecified type [facility])    40 year old female presented after a second seizure. No prior history of seizures. CT head one month ago was within normal limits without any evidence of intracranial bleeds or masses. Was supposed to follow up with neurology; has not done that yet. Had another seizure likely this morning while sleeping. Electrolytes Within normal limits. Hemoglobin consistent with draw a month ago. Point of care glucose within normal limits. No focal neurological deficits on exam. CT head from a month ago was normal. Doubt CVA. Patient otherwise well-appearing in no acute distress, no fevers and chills, no meningeal signs. Doubt encephalitis or meningitis. UDS without evidence of ingestion. Denies alcohol use. Spoke with neurology. Per the recommendation starting her on Keppra load and then Keppra twice a day, and getting an MRI brain.  Patient does have a small laceration on the right lateral aspect of her tongue. Not through and through. Movements of  tongue intact. Will not require fixation here.  EKG shows evidence of atrial fibrillation. No prior history of atrial fibrillation. No other evidence of interval abnormality or ischemic changes. Patient not having any chest pain or shortness of breath. Doubt ACS or pulmonary embolism. Does endorse palpitations. Chest x-ray WN, no evidence of PNA or PTX, normal cardiac size. CHAD2VASC score of 2. Will need anticoagulation. Electrolytes WNL. Hemoglobin is consistent with last blood draw a month ago. TSH and T4 WNL. UDS without evidence of ingestion.  Spoke with the internal medicine team. They will admit to their service for further intervention and observation.  Maryan Puls, MD 03/06/16 [place], MD 03/08/16 [phone]

## 2016-03-07 ENCOUNTER — Inpatient Hospital Stay (HOSPITAL_BASED_OUTPATIENT_CLINIC_OR_DEPARTMENT_OTHER): Payer: BLUE CROSS/BLUE SHIELD

## 2016-03-07 DIAGNOSIS — I10 Essential (primary) hypertension: Secondary | ICD-10-CM | POA: Diagnosis not present

## 2016-03-07 DIAGNOSIS — I4891 Unspecified atrial fibrillation: Secondary | ICD-10-CM

## 2016-03-07 DIAGNOSIS — R569 Unspecified convulsions: Secondary | ICD-10-CM | POA: Diagnosis not present

## 2016-03-07 LAB — CBC
HCT: 29.1 % — ABNORMAL LOW (ref 36.0–46.0)
Hemoglobin: 8.2 g/dL — ABNORMAL LOW (ref 12.0–15.0)
MCH: 20.9 pg — ABNORMAL LOW (ref 26.0–34.0)
MCHC: 28.2 g/dL — ABNORMAL LOW (ref 30.0–36.0)
MCV: 74.2 fL — ABNORMAL LOW (ref 78.0–100.0)
Platelets: 307 10*3/uL (ref 150–400)
RBC: 3.92 MIL/uL (ref 3.87–5.11)
RDW: 15.8 % — AB (ref 11.5–15.5)
WBC: 7.9 10*3/uL (ref 4.0–10.5)

## 2016-03-07 LAB — ECHOCARDIOGRAM COMPLETE
HEIGHTINCHES: 70 in
Weight: 3824 oz

## 2016-03-07 MED ORDER — ENOXAPARIN SODIUM 40 MG/0.4ML ~~LOC~~ SOLN
40.0000 mg | SUBCUTANEOUS | Status: DC
  Administered 2016-03-08: 40 mg via SUBCUTANEOUS
  Filled 2016-03-07: qty 0.4

## 2016-03-07 NOTE — Progress Notes (Signed)
Unable to perform EEG at this time. Pt has a sewn in weave where she would have to have it professionally removed.

## 2016-03-07 NOTE — Progress Notes (Signed)
Subjective: Tammie [name] had NAEON. She says she feels a little 'groggy' this morning with improving myalgias but otherwise denies other symptoms. She also denies any recurrent palpitations or SOB. Telemetry shows no recurrent episodes of atrial fibrillation since in the ED.  Objective: Vital signs in last 24 hours: Filed Vitals:   03/06/16 1728 03/06/16 2126 03/07/16 0137 03/07/16 0457  BP: 124/52 115/42 125/56 107/49  Pulse: 107 87 76 80  Temp: 98.7 F (37.1 C) 98.5 F (36.9 C) 97.8 F (36.6 C) 98 F (36.7 C)  TempSrc: Oral Oral Oral Oral  Resp: 18 18 18 18   Height:      Weight:      SpO2: 97% 100% 100% 95%   Gen: Well-appearing, alert and oriented to person, place, and time HEENT: Tongue with scant dried blood. Teeth without blood. CV: Normal rate, regular rhythm, no murmurs, rubs, or gallops Pulmonary: Normal effort, CTA bilaterally, no crackles or wheezes Abdominal: Soft, non-tender, non-distended, without rebound, guarding, or masses Neuro: CN II-XII grossly intact, no focal weakness or sensory deficits noted  Lab Results: Basic Metabolic Panel:  Recent Labs Lab 03/06/16 0853 03/06/16 1303  NA 136  --   K 3.7  --   CL 104  --   CO2 27  --   GLUCOSE 90  --   BUN 12  --   CREATININE 0.73  --   CALCIUM 8.8*  --   MG  --  1.7  PHOS  --  3.3   CBC:  Recent Labs Lab 03/06/16 0853 03/07/16 0448  WBC 10.1 7.9  HGB 9.1* 8.2*  HCT 31.1* 29.1*  MCV 74.0* 74.2*  PLT 364 307   Studies/Results: Dg Chest 2 View  03/06/2016  CLINICAL DATA:  Patient with diffuse body aches and weakness. Possible seizure. EXAM: CHEST  2 VIEW COMPARISON:  Chest radiograph 07/05/2015. FINDINGS: Stable enlarged cardiac and mediastinal contours. No consolidative pulmonary opacities. No pleural effusion or pneumothorax. Thoracic spine degenerative changes. IMPRESSION: No active cardiopulmonary disease. Electronically Signed   By: Lovey Newcomer M.D.   On: 03/06/2016 10:13   Mr Tammie [name]  Contrast  03/06/2016  CLINICAL DATA:  40 year old hypertensive female with seizure. Subsequent encounter. EXAM: MRI HEAD WITHOUT CONTRAST TECHNIQUE: Multiplanar, multiecho pulse sequences of the Tammie and surrounding structures were obtained without intravenous contrast. COMPARISON:  01/16/2016 head CT.  No comparison Tammie MR. FINDINGS: Exam is motion degraded. No acute infarct or intracranial hemorrhage. No evidence of mesial temporal sclerosis. No intracranial mass lesion noted on this unenhanced exam. Partially empty slightly expanded sella without secondary findings of pseudotumor. Major intracranial vascular structures are patent. No obvious orbital abnormality. IMPRESSION: Motion degraded unenhanced examination without seizure focus identified. Please see above. Electronically Signed   By: Genia Del M.D.   On: 03/06/2016 13:02   Assessment/Plan: 1. Seizure - this is now the patient second seizure. She is not on epileptic medications currently. She was arranged to follow-up in the neurology clinic after her last episode, but has been unable to do so yet. Patient without focal neurologic deficits, only postictal symptoms are myalgias, throat pain. MRI negative,  UDS, TSH, Upreg all negative Possible precipitants include significant stress through work and and possibly OSA given her body habitus in combination with nocturnal hypotension.  -Neurology following; appreciate the thoughtful care of this mutual patient -EEG today -Further workup as recommended by neurology -Keppra -Seizure precautions -Consider split sleep study order at hospital f/u  2. New atrial fibrillation -  likely induced by her seizures. CHADS-VASC of 2 (female, HTN), so technically would be a candidate for anticoagulation on discharge, but without recurrent episodes, it may be more prudent to defer anticoagulation and consider 30-day event monitor, given the patient remains seizure-free.. -Continue home  verapamil -TTE -Lovenox per pharm   3. HTN -Continue home verapamil  Dispo: Disposition is deferred at this time, awaiting improvement of current medical problems.  Anticipated discharge later today or tomorrow morning.  The patient does not have a current PCP (No Pcp Per Patient) and does need an [facility] hospital follow-up appointment after discharge.  The patient does not have transportation limitations that hinder transportation to clinic appointments.   LOS: 1 day   [name], MD 03/07/2016, 9:29 AM

## 2016-03-07 NOTE — Progress Notes (Signed)
Interval History:                                                                                                                      Tammie [name] is an 40 y.o. female patient with a history of a single previous episode concerning for seizure. Apparently she was making some type of vocalization and by the time that family saw her, she had wet the bed and bitten her tongue. She was quite confused when they found her with gradual improvement. Today, her mother states that she went to wake her up and she was difficult to arouse. When she did begin to get up they noticed that there was blood on her pillow and that she had wet the bed. She was very fatigued, but was brought into the hospital. She has been placed on Keppra 500 mg BID and has had no further seizures. She is unable to have EEG due to Weave. She currently feels very drowsy on Keppra and is questioning if she should be on another AED.    Past Medical History: Past Medical History  Diagnosis Date  . Hypertension   . Fibroid   . Anemia   . Abnormal Pap smear   . Hypopotassemia   . Esophageal reflux   . Palpitations   . Pregnancy induced hypertension   . Dysrhythmia     pvc's  . Asthma     as child  . Obesity     Past Surgical History  Procedure Laterality Date  . Tubal ligation    . Colposcopy    . Oophorectomy      Left  . Laparoscopic gastric sleeve resection      Family History: Family History  Problem Relation Age of Onset  . Hypertension Father   . Kidney disease Father   . Cancer Father   . Diabetes Father   . Diabetes Maternal Grandmother   . Hypertension Paternal Grandmother   . Epilepsy Cousin   . Epilepsy Maternal Aunt   . Epilepsy Maternal Uncle     Social History:   reports that she has never smoked. She has never used smokeless tobacco. She reports that she drinks alcohol. She reports that she does not use illicit drugs.  Allergies:  Allergies  Allergen Reactions  . Sulfa Antibiotics  Anaphylaxis, Rash and Other (See Comments)    "Burning"  . Sulfamethoxazole Anaphylaxis, Rash and Other (See Comments)    burning     Medications:  Current facility-administered medications:  .  acetaminophen (TYLENOL) tablet 650 mg, 650 mg, Oral, Q4H PRN, 650 mg at 03/06/16 2156 **OR** acetaminophen (TYLENOL) suppository 650 mg, 650 mg, Rectal, Q4H PRN, Milagros Loll, MD .  albuterol (PROVENTIL) (2.5 MG/3ML) 0.083% nebulizer solution 2.5 mg, 2.5 mg, Nebulization, Q4H PRN, Milagros Loll, MD .  Derrill Memo ON 03/08/2016] enoxaparin (LOVENOX) injection 40 mg, 40 mg, Subcutaneous, Q24H, Milagros Loll, MD .  levETIRAcetam (KEPPRA) tablet 500 mg, 500 mg, Oral, BID, Maryan Puls, MD, 500 mg at 03/07/16 [id number] .  LORazepam (ATIVAN) injection 1-2 mg, 1-2 mg, Intravenous, Q2H PRN, Milagros Loll, MD .  ondansetron [facility]) tablet 4 mg, 4 mg, Oral, Q6H PRN **OR** ondansetron (ZOFRAN) injection 4 mg, 4 mg, Intravenous, Q6H PRN, Milagros Loll, MD .  senna-docusate (Senokot-S) tablet 1 tablet, 1 tablet, Oral, QHS PRN, Milagros Loll, MD, 1 tablet at 03/06/16 2159   Neurologic Examination:                                                                                                     Today's Vitals   03/07/16 0137 03/07/16 0457 03/07/16 0955 03/07/16 1043  BP: 125/56 107/49 132/58 139/66  Pulse: 76 80  72  Temp: 97.8 F (36.6 C) 98 F (36.7 C)  98.7 F (37.1 C)  TempSrc: Oral Oral  Oral  Resp: 18 18  18   Height:      Weight:      SpO2: 100% 95%  100%  PainSc:        Evaluation of higher integrative functions including: Level of alertness: Alert,  Oriented to time, place and person Speech: fluent, no evidence of dysarthria or aphasia noted.  Test the following cranial nerves: 2-12 grossly intact Motor examination: Normal tone, bulk, full 5/5 motor strength in  all 4 extremities Examination of sensation : Normal and symmetric sensation to pinprick in all 4 extremities and on face Examination of deep tendon reflexes: 2+, normal and symmetric in all extremities, normal plantars bilaterally Test coordination: Normal finger nose testing, with no evidence of limb appendicular ataxia or abnormal involuntary movements or tremors noted.  Gait: Deferred   Lab Results: Basic Metabolic Panel:  Recent Labs Lab 03/06/16 0853 03/06/16 1303  NA 136  --   K 3.7  --   CL 104  --   CO2 27  --   GLUCOSE 90  --   BUN 12  --   CREATININE 0.73  --   CALCIUM 8.8*  --   MG  --  1.7  PHOS  --  3.3    Liver Function Tests:  Recent Labs Lab 03/06/16 1303  AST 18  ALT 13*  ALKPHOS 47  BILITOT 0.3  PROT 6.7  ALBUMIN 2.9*   No results for input(s): LIPASE, AMYLASE in the last 168 hours. No results for input(s): AMMONIA in the last 168 hours.  CBC:  Recent Labs Lab 03/06/16 0853 03/07/16 0448  WBC 10.1 7.9  HGB 9.1* 8.2*  HCT 31.1* 29.1*  MCV 74.0* 74.2*  PLT 364  307    Cardiac Enzymes:  Recent Labs Lab 03/06/16 0853 03/06/16 1303  CKTOTAL 354*  --   TROPONINI  --  0.03    Lipid Panel: No results for input(s): CHOL, TRIG, HDL, CHOLHDL, VLDL, LDLCALC in the last 168 hours.  CBG:  Recent Labs Lab 03/06/16 0859  GLUCAP 88    Microbiology: Results for orders placed or performed in visit on 02/02/15  GC/Chlamydia Probe Amp     Status: None   Collection Time: 02/02/15  4:43 PM  Result Value Ref Range Status   CT Probe RNA NEGATIVE  Final   GC Probe RNA NEGATIVE  Final    Comment:                                                                                         **Normal Reference Range: Negative**         Assay performed using the Gen-Probe APTIMA COMBO2 (R) Assay.   Acceptable specimen types for this assay include APTIMA Swabs (Unisex, endocervical, urethral, or vaginal), first void urine, and ThinPrep liquid based  cytology samples.     Imaging: Dg Chest 2 View  03/06/2016  CLINICAL DATA:  Patient with diffuse body aches and weakness. Possible seizure. EXAM: CHEST  2 VIEW COMPARISON:  Chest radiograph 07/05/2015. FINDINGS: Stable enlarged cardiac and mediastinal contours. No consolidative pulmonary opacities. No pleural effusion or pneumothorax. Thoracic spine degenerative changes. IMPRESSION: No active cardiopulmonary disease. Electronically Signed   By: Lovey Newcomer M.D.   On: 03/06/2016 10:13   [name] Contrast  03/06/2016  CLINICAL DATA:  40 year old hypertensive female with seizure. Subsequent encounter. EXAM: MRI HEAD WITHOUT CONTRAST TECHNIQUE: Multiplanar, multiecho pulse sequences of the brain and surrounding structures were obtained without intravenous contrast. COMPARISON:  01/16/2016 head CT.  No comparison brain MR. FINDINGS: Exam is motion degraded. No acute infarct or intracranial hemorrhage. No evidence of mesial temporal sclerosis. No intracranial mass lesion noted on this unenhanced exam. Partially empty slightly expanded sella without secondary findings of pseudotumor. Major intracranial vascular structures are patent. No obvious orbital abnormality. IMPRESSION: Motion degraded unenhanced examination without seizure focus identified. Please see above. Electronically Signed   By: Genia Del M.D.   On: 03/06/2016 13:02    Assessment and plan:   ELEYNA LISI is an 40 y.o. female patient with

## 2016-03-07 NOTE — Progress Notes (Signed)
  Echocardiogram 2D Echocardiogram has been performed.  Tammie [name] 03/07/2016, 5:03 PM

## 2016-03-07 NOTE — Care Management Note (Addendum)
Case Management Note  Patient Details  Name: Tammie [name] MRN: [id number] Date of Birth: Aug 22, 1976  Subjective/Objective:                    Action/Plan: CM consult placed for transportation for the patient post discharge. CM consulted with CSW, then met with the patient and provided her information on SCAT. Pt confused by the information. She was not aware she was not going to be driving. Pt also without a PCP. Pt given the [facility] number to assist her in finding a PCP if she is not followed by the Internal Medicine clinic.    Expected Discharge Date:                  Expected Discharge Plan:  Home/Self Care  In-House Referral:     Discharge planning Services     Post Acute Care Choice:    Choice offered to:     DME Arranged:    DME Agency:     HH Arranged:    HH Agency:     Status of Service:  In process, will continue to follow  Medicare Important Message Given:    Date Medicare IM Given:    Medicare IM give by:    Date Additional Medicare IM Given:    Additional Medicare Important Message give by:     If discussed at [place] of Stay Meetings, dates discussed:    Additional Comments:  Pollie Friar, RN 03/07/2016, 2:30 PM

## 2016-03-07 NOTE — Progress Notes (Signed)
Nutrition Brief Note  Patient identified on the Malnutrition Screening Tool (MST) Report. Patient with weight loss related to gastric sleeve procedure ~8 months ago. Weight loss was not unintentional.   Wt Readings from Last 15 Encounters:  03/06/16 239 lb (108.41 kg)  01/20/16 242 lb (109.77 kg)  07/05/15 265 lb (120.203 kg)  02/02/15 294 lb 12.8 oz (133.72 kg)  08/22/14 288 lb 12.8 oz (130.999 kg)  08/03/14 291 lb 12.8 oz (132.36 kg)  04/22/14 290 lb 1.6 oz (131.588 kg)  03/05/14 282 lb (127.914 kg)  02/28/14 292 lb (132.45 kg)  06/18/13 290 lb (131.543 kg)  04/19/13 279 lb (126.554 kg)  12/27/12 274 lb 12.8 oz (124.648 kg)  07/15/12 298 lb 4 oz (135.285 kg)  11/16/11 276 lb (125.193 kg)  05/11/11 284 lb (128.822 kg)    Body mass index is 34.29 kg/(m^2). Patient meets criteria for class 1 obesity based on current BMI.   Current diet order is regular, patient is consuming approximately 100% of meals at this time. Labs and medications reviewed.   No nutrition interventions warranted at this time. If nutrition issues arise, please consult RD.   Molli Barrows, RD, LDN, [place] Pager [phone] After Hours Pager [phone]

## 2016-03-07 NOTE — Progress Notes (Signed)
  Date: 03/07/2016  Patient name: Tammie [name]  Medical record number: [id number]  Date of birth: [birth year]   I have seen and evaluated [name] and discussed their care with the Residency Team. Ms Hammock is a 40 yo female who was admitted for her second seizure after her second sz in 7 weeks. Both occurred in her sleep. After her first she was encouraged to F/U outpt neuro but has not yet seen neuro. She is not on any antiepileptic meds. She denies any ingestions. She does have a fam hx of szs in an aunt, uncle, and cousin.   She was also found to be in A Fib with a rate of 111. She has no known h/o A Fib. She did feel palpitations during this. She saw cards after the first sz (bc not clear if sxs or syncope) and plan was to do even monitor only if no other etiology found. Diff dx at that time were nutritional deficiency 2/2 gastric sleeve, poor oral intake 2/2 job demands, cardiac cause.   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   Filed Vitals:   03/07/16 0457 03/07/16 0955  BP: 107/49 132/58  Pulse: 80   Temp: 98 F (36.7 C)   Resp: 18    NAD Converted to sinus No LE edema + 2 DP pulses  HgB 8.2 MCV 74  I personally viewed his CXR images and confirmed by reading with the official read. No abnl  I personally viewed his EKG and confirmed by reading with the official read. A Fib, nl axis, no ischemic changes  Assessment and Plan: I have seen and evaluated the patient as outlined above. I agree with the formulated Assessment and Plan as detailed in the residents' note, with the following changes:   1. Seizure - this is her second sz in 7 weeks. The labs that we ordered did not reveal a metabolic abnl and she has no intoxication hx. Her MRI does not show a focus. Neuro has started Keppra and ordered an EEG. She wondered why both szs occurred at night and wondered if had to do with her BP dropping at night which she feels it does. She is  also obese with a BMI of 34 and although she is losing weight after the gastric sleeve, she could have OSA. Hypoxia and hypotension could lead to a decreased LOC while not a classic precipitant for SZ, might be considered. She does not have a PCP and we encouraged her to est with a PCP so that someone could be looking at the whole picture and discuss sleep study.   2. A Fib - this is likely as a result of the sz. She converted spontaneously overnight. Dr Merrilyn Puma has addressed this issue of anticoag in his note and we are not starting AC at this time.   Possibly home today if EEG done and neuro agrees. Consult care mgmt for short term disability info as pt will not be able to drive.  Bartholomew Crews, MD 5/29/201710:33 AM

## 2016-03-08 ENCOUNTER — Encounter: Payer: Self-pay | Admitting: Internal Medicine

## 2016-03-08 DIAGNOSIS — G40409 Other generalized epilepsy and epileptic syndromes, not intractable, without status epilepticus: Secondary | ICD-10-CM

## 2016-03-08 DIAGNOSIS — I4891 Unspecified atrial fibrillation: Secondary | ICD-10-CM | POA: Diagnosis not present

## 2016-03-08 LAB — HIV ANTIBODY (ROUTINE TESTING W REFLEX): HIV SCREEN 4TH GENERATION: NONREACTIVE

## 2016-03-08 MED ORDER — LEVETIRACETAM 500 MG PO TABS: 500.0000 mg | ORAL_TABLET | Freq: Two times a day (BID) | ORAL | Status: DC

## 2016-03-08 MED ORDER — LUBIPROSTONE 24 MCG PO CAPS: 24.0000 ug | ORAL_CAPSULE | Freq: Two times a day (BID) | ORAL | Status: DC

## 2016-03-08 MED ORDER — POLYETHYLENE GLYCOL 3350 17 G PO PACK: 17.0000 g | PACK | Freq: Every day | ORAL | Status: DC

## 2016-03-08 NOTE — Progress Notes (Signed)
Patient reports dizziness after taking keppra. MD aware. Patient states it is slowly resolving, educated patient to sit at side of bed before ambulating.

## 2016-03-08 NOTE — Discharge Instructions (Signed)
Levetiracetam tablets What is this medicine? LEVETIRACETAM (lee ve tye RA se tam) is an antiepileptic drug. It is used with other medicines to treat certain types of seizures. This medicine may be used for other purposes; ask your health care provider or pharmacist if you have questions. What should I tell my health care provider before I take this medicine? They need to know if you have any of these conditions: -kidney disease -suicidal thoughts, plans, or attempt; a previous suicide attempt by you or a family member -an unusual or allergic reaction to levetiracetam, other medicines, foods, dyes, or preservatives -pregnant or trying to get pregnant -breast-feeding How should I use this medicine? Take this medicine by mouth with a glass of water. Follow the directions on the prescription label. Swallow the tablets whole. Do not crush or chew this medicine. You may take this medicine with or without food. Take your doses at regular intervals. Do not take your medicine more often than directed. Do not stop taking this medicine or any of your seizure medicines unless instructed by your doctor or health care professional. Stopping your medicine suddenly can increase your seizures or their severity. A special MedGuide will be given to you by the pharmacist with each prescription and refill. Be sure to read this information carefully each time. Contact your pediatrician or health care professional regarding the use of this medication in children. While this drug may be prescribed for children as young as 76 years of age for selected conditions, precautions do apply. Overdosage: If you think you have taken too much of this medicine contact a poison control center or emergency room at once. NOTE: This medicine is only for you. Do not share this medicine with others. What if I miss a dose? If you miss a dose, take it as soon as you can. If it is almost time for your next dose, take only that dose. Do not  take double or extra doses. What may interact with this medicine? This medicine may interact with the following medications: -carbamazepine -colesevelam -probenecid -sevelamer This list may not describe all possible interactions. Give your health care provider a list of all the medicines, herbs, non-prescription drugs, or dietary supplements you use. Also tell them if you smoke, drink alcohol, or use illegal drugs. Some items may interact with your medicine. What should I watch for while using this medicine? Visit your doctor or health care professional for a regular check on your progress. Wear a medical identification bracelet or chain to say you have epilepsy, and carry a card that lists all your medications. It is important to take this medicine exactly as instructed by your health care professional. When first starting treatment, your dose may need to be adjusted. It may take weeks or months before your dose is stable. You should contact your doctor or health care professional if your seizures get worse or if you have any new types of seizures. You may get drowsy or dizzy. Do not drive, use machinery, or do anything that needs mental alertness until you know how this medicine affects you. Do not stand or sit up quickly, especially if you are an older patient. This reduces the risk of dizzy or fainting spells. Alcohol may interfere with the effect of this medicine. Avoid alcoholic drinks. The use of this medicine may increase the chance of suicidal thoughts or actions. Pay special attention to how you are responding while on this medicine. Any worsening of mood, or thoughts of suicide or dying  should be reported to your health care professional right away. Women who become pregnant while using this medicine may enroll in the [place] Pregnancy Registry by calling 902-035-2757. This registry collects information about the safety of antiepileptic drug use during  pregnancy. What side effects may I notice from receiving this medicine? Side effects you should report to your doctor or health care professional as soon as possible: -allergic reactions like skin rash, itching or hives, swelling of the face, lips, or tongue -breathing problems -dark urine -general ill feeling or flu-like symptoms -problems with balance, talking, walking -unusually weak or tired -worsening of mood, thoughts or actions of suicide or dying -yellowing of the eyes or skin Side effects that usually do not require medical attention (report to your doctor or health care professional if they continue or are bothersome): -diarrhea -dizzy, drowsy -headache -loss of appetite This list may not describe all possible side effects. Call your doctor for medical advice about side effects. You may report side effects to FDA at 1-800-FDA-1088. Where should I keep my medicine? Keep out of reach of children. Store at room temperature between 15 and 30 degrees C (59 and 86 degrees F). Throw away any unused medicine after the expiration date. NOTE: This sheet is a summary. It may not cover all possible information. If you have questions about this medicine, talk to your doctor, pharmacist, or health care provider.    2016, Elsevier/Gold Standard. (2013-08-20 08:42:48)  Seizure, Adult A seizure is abnormal electrical activity in the brain. Seizures usually last from 30 seconds to 2 minutes. There are various types of seizures. Before a seizure, you may have a warning sensation (aura) that a seizure is about to occur. An aura may include the following symptoms:   Fear or anxiety.  Nausea.  Feeling like the room is spinning (vertigo).  Vision changes, such as seeing flashing lights or spots. Common symptoms during a seizure include:  A change in attention or behavior (altered mental status).  Convulsions with rhythmic jerking movements.  Drooling.  Rapid eye  movements.  Grunting.  Loss of bladder and bowel control.  Bitter taste in the mouth.  Tongue biting. After a seizure, you may feel confused and sleepy. You may also have an injury resulting from convulsions during the seizure. HOME CARE INSTRUCTIONS   If you are given medicines, take them exactly as prescribed by your health care provider.  Keep all follow-up appointments as directed by your health care provider.  Do not swim or drive or engage in risky activity during which a seizure could cause further injury to you or others until your health care provider says it is OK.  Get adequate rest.  Teach friends and family what to do if you have a seizure. They should:  Lay you on the ground to prevent a fall.  Put a cushion under your head.  Loosen any tight clothing around your neck.  Turn you on your side. If vomiting occurs, this helps keep your airway clear.  Stay with you until you recover.  Know whether or not you need emergency care. SEEK IMMEDIATE MEDICAL CARE IF:  The seizure lasts longer than 5 minutes.  The seizure is severe or you do not wake up immediately after the seizure.  You have an altered mental status after the seizure.  You are having more frequent or worsening seizures. Someone should drive you to the emergency department or call local emergency services (911 in [place] MAKE SURE YOU:  Understand these instructions.  Will watch your condition.  Will get help right away if you are not doing well or get worse.   This information is not intended to replace advice given to you by your health care provider. Make sure you discuss any questions you have with your health care provider.   Document Released: 09/23/2000 Document Revised: 10/17/2014 Document Reviewed: 05/08/2013 Elsevier Interactive Patient Education [employer].

## 2016-03-08 NOTE — Progress Notes (Signed)
Subjective: Ms. Bjorkman had NAEON. This morning, her grogginess is much improved. She has some leg cramps that improve with ambulation. She denies other symptoms at this time, however. Telemetry still without recurrent episodes of atrial fibrillation.  Objective: Vital signs in last 24 hours: Filed Vitals:   03/07/16 1753 03/07/16 2138 03/08/16 0104 03/08/16 0548  BP: 129/54 127/62 130/54 131/52  Pulse: 89 78 72 66  Temp: 99.3 F (37.4 C) 98.4 F (36.9 C) 97.8 F (36.6 C) 98.4 F (36.9 C)  TempSrc: Oral Oral Oral Oral  Resp: 18 18 18 18   Height:      Weight:      SpO2: 100% 100% 95% 98%   Gen: Well-appearing, alert and oriented to person, place, and time HEENT: Tongue with scant dried blood. Teeth without blood. CV: Normal rate, regular rhythm, no murmurs, rubs, or gallops Pulmonary: Normal effort, CTA bilaterally, no crackles or wheezes Abdominal: Soft, non-tender, non-distended, without rebound, guarding, or masses Neuro: CN II-XII grossly intact, no focal weakness or sensory deficits noted  Lab Results: Basic Metabolic Panel:  Recent Labs Lab 03/06/16 0853 03/06/16 1303  NA 136  --   K 3.7  --   CL 104  --   CO2 27  --   GLUCOSE 90  --   BUN 12  --   CREATININE 0.73  --   CALCIUM 8.8*  --   MG  --  1.7  PHOS  --  3.3   CBC:  Recent Labs Lab 03/06/16 0853 03/07/16 0448  WBC 10.1 7.9  HGB 9.1* 8.2*  HCT 31.1* 29.1*  MCV 74.0* 74.2*  PLT 364 307   Studies/Results: Dg Chest 2 View  03/06/2016  CLINICAL DATA:  Patient with diffuse body aches and weakness. Possible seizure. EXAM: CHEST  2 VIEW COMPARISON:  Chest radiograph 07/05/2015. FINDINGS: Stable enlarged cardiac and mediastinal contours. No consolidative pulmonary opacities. No pleural effusion or pneumothorax. Thoracic spine degenerative changes. IMPRESSION: No active cardiopulmonary disease. Electronically Signed   By: Lovey Newcomer M.D.   On: 03/06/2016 10:13   [name] Contrast  03/06/2016   CLINICAL DATA:  40 year old hypertensive female with seizure. Subsequent encounter. EXAM: MRI HEAD WITHOUT CONTRAST TECHNIQUE: Multiplanar, multiecho pulse sequences of the brain and surrounding structures were obtained without intravenous contrast. COMPARISON:  01/16/2016 head CT.  No comparison brain MR. FINDINGS: Exam is motion degraded. No acute infarct or intracranial hemorrhage. No evidence of mesial temporal sclerosis. No intracranial mass lesion noted on this unenhanced exam. Partially empty slightly expanded sella without secondary findings of pseudotumor. Major intracranial vascular structures are patent. No obvious orbital abnormality. IMPRESSION: Motion degraded unenhanced examination without seizure focus identified. Please see above. Electronically Signed   By: Genia Del M.D.   On: 03/06/2016 13:02   Assessment/Plan: 1. Seizure - this is now the patient second seizure. She is not on epileptic medications currently. She was arranged to follow-up in the neurology clinic after her last episode, but has been unable to do so yet. Patient without focal neurologic deficits, only postictal symptoms are myalgias, throat pain. MRI negative,  UDS, TSH, Upreg all negative Possible precipitants include significant stress through work and and possibly OSA given her body habitus in combination with nocturnal hypotension.  -Neurology recs; appreciate the thoughtful care of this mutual patient -EEG not done 2/2 weave -Keppra PO BID, f/u in neurology clinic -Seizure precautions -Consider split sleep study order at hospital f/u  2. New atrial fibrillation - likely induced  by her seizures. CHADS-VASC of 2 (female, HTN), so technically would be a candidate for anticoagulation on discharge, but without recurrent episodes, it may be more prudent to defer anticoagulation and consider 30-day event monitor, given the patient remains seizure-free. TTE EF 99991111, grade 2 diastolic dysfunction, mild LAE, mild TR,  dilated ascending aorta.  -Home verapamil held; [pressures have been good off -Lovenox per pharm   3. HTN -Home verapamil held  Dispo: Disposition is deferred at this time, awaiting improvement of current medical problems.  Anticipated discharge later today or tomorrow morning.  The patient does not have a current PCP (No Pcp Per Patient) and does need an [facility] hospital follow-up appointment after discharge.  The patient does not have transportation limitations that hinder transportation to clinic appointments.   LOS: 2 days   [name], MD 03/08/2016, 7:32 AM

## 2016-03-08 NOTE — Care Management Note (Signed)
Case Management Note  Patient Details  Name: RESHELL DEMAYO MRN: [id number] Date of Birth: 1976-09-13  Subjective/Objective:                    Action/Plan: Pt discharging home with self care. No further needs per CM.   Expected Discharge Date:                  Expected Discharge Plan:  Home/Self Care  In-House Referral:     Discharge planning Services     Post Acute Care Choice:    Choice offered to:     DME Arranged:    DME Agency:     HH Arranged:    [place] Agency:     Status of Service:  Completed, signed off  Medicare Important Message Given:    Date Medicare IM Given:    Medicare IM give by:    Date Additional Medicare IM Given:    Additional Medicare Important Message give by:     If discussed at [place] of Stay Meetings, dates discussed:    Additional Comments:  Pollie Friar, RN 03/08/2016, 11:32 AM

## 2016-03-08 NOTE — Progress Notes (Signed)
Discharge instructions received. RN discussed follow up appointment with internal med, chmg, patient verbalized understanding. MD states he will make referral for eeg when patient is seen at follow up appointment. IV removed, tele removed. Patient reports no bowel movement since 5/25, states she is passing flatus. MD notified, senna ordered, amitiza continued. RN discussed constipation prevention- increase activity, fluids. Patient states she feels dizzy after taking keppra. Orthostatic vitals are being obtained, MD to be notified.

## 2016-03-09 NOTE — Discharge Summary (Signed)
Name: Tammie [name] MRN: [id number] DOB: April 10, 1976 40 y.o. PCP: No Pcp Per Patient  Date of Admission: 03/06/2016  8:18 AM Date of Discharge: 03/09/2016 Attending Physician: No att. providers found  Discharge Diagnosis: 1. Tonic-clonic seizure  2. Atrial fibrillation Active Problems:   Seizure [facility])  Discharge Medications:   Medication List    STOP taking these medications        verapamil 120 MG tablet  Commonly known as:  CALAN      TAKE these medications        acetaminophen 500 MG tablet  Commonly known as:  TYLENOL  Take 1,000 mg by mouth every 6 (six) hours as needed (pain).     albuterol 108 (90 Base) MCG/ACT inhaler  Commonly known as:  PROVENTIL HFA;VENTOLIN HFA  Inhale 1-2 puffs into the lungs every 6 (six) hours as needed for wheezing or shortness of breath.     CALCIUM + D PO  Take 3 tablets by mouth daily.     levETIRAcetam 500 MG tablet  Commonly known as:  KEPPRA  Take 1 tablet (500 mg total) by mouth 2 (two) times daily.     lubiprostone 24 MCG capsule  Commonly known as:  AMITIZA  Take 1 capsule (24 mcg total) by mouth 2 (two) times daily with a meal.     multivitamin with minerals Tabs tablet  Take 1 tablet by mouth daily.     polyethylene glycol packet  Commonly known as:  MIRALAX / GLYCOLAX  Take 17 g by mouth daily.     VITAMIN B-12 PO  Take 1 tablet by mouth daily.        Disposition and follow-up:   Tammie [name] was discharged from [facility] in Good condition.  At the hospital follow up visit please address:  1.  Is patient's grogginess/dizziness on Keppra improving? Has the patient had any recurrent seizures? Is the patient not driving as instructed? Is the patient having any palpitations?  2.  Labs / imaging needed at time of follow-up: Make outpatient neurology clinic referral for epilepsy follow-up, with EEG per neuro. Consider 30-day event monitor at next cardiology follow-up  3.  Pending  labs/ test needing follow-up: None  Follow-up Appointments:   Discharge Instructions:     Discharge Instructions    Diet - low sodium heart healthy    Complete by:  As directed      Increase activity slowly    Complete by:  As directed            Consultations:  Neurology  Procedures Performed:  Dg Chest 2 View  03/06/2016  CLINICAL DATA:  Patient with diffuse body aches and weakness. Possible seizure. EXAM: CHEST  2 VIEW COMPARISON:  Chest radiograph 07/05/2015. FINDINGS: Stable enlarged cardiac and mediastinal contours. No consolidative pulmonary opacities. No pleural effusion or pneumothorax. Thoracic spine degenerative changes. IMPRESSION: No active cardiopulmonary disease. Electronically Signed   By: Tammie [name] M.D.   On: 03/06/2016 10:13   [name] Contrast  03/06/2016  CLINICAL DATA:  40 year old hypertensive female with seizure. Subsequent encounter. EXAM: MRI HEAD WITHOUT CONTRAST TECHNIQUE: Multiplanar, multiecho pulse sequences of the brain and surrounding structures were obtained without intravenous contrast. COMPARISON:  01/16/2016 head CT.  No comparison brain MR. FINDINGS: Exam is motion degraded. No acute infarct or intracranial hemorrhage. No evidence of mesial temporal sclerosis. No intracranial mass lesion noted on this unenhanced exam. Partially empty slightly expanded sella without secondary findings  of pseudotumor. Major intracranial vascular structures are patent. No obvious orbital abnormality. IMPRESSION: Motion degraded unenhanced examination without seizure focus identified. Please see above. Electronically Signed   By: Tammie [name] M.D.   On: 03/06/2016 13:02    2D Echo: 03/07/2016 Study Conclusions  - Left ventricle: The cavity size was normal. Wall thickness was  normal. Systolic function was normal. The estimated ejection  fraction was in the range of 50% to 55%. Wall motion was normal;  there were no regional wall motion abnormalities. Features  are  consistent with a pseudonormal left ventricular filling pattern,  with concomitant abnormal relaxation and increased filling  pressure (grade 2 diastolic dysfunction). - Left atrium: The atrium was mildly dilated.  Impressions:  - Low normal LV systolic function; grade 2 diastolic dysfunction;  mild LAE; mild TR; severely dilated ascending aorta (51 mm);  suggest CTA or MRA to further assess.  Cardiac Cath: None  Admission HPI: Tammie [name] is a 40 year old with a history of gastric sleeve complicated by anemia and hypokalemia, HTN, and one previous seizure 7 weeks ago who presents with a recurrent seizure. This morning, she woke up and both she and her family noticed that her pillow was soaked in blood and her bed was wet with urine, and she had bit her tongue. She also noted a sore throat and diffuse muscle aches when she woke up this morning. She felt completely normal and she went to bed last night. She says this is very similar to the episode she had at the beginning of last month. Currently, she notes some fatigue and intermittent palpitations, without other associated symptoms, such as recent illness, chest pain, shortness of breath, nausea, vomiting, abdominal pain, headache, vision changes, focal numbness or weakness, or confusion. She has lost over 50 pounds since the sleeve was placed. 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 She denies any other stressors, tobacco or alcohol abuse or other recreational drug use. She has not had any seizures in between the episode on April 8 and today. She has a cousin, an aunt and an uncle who all have a history of seizures. She has no other complaints at this time.  Hospital Course by problem list: Active Problems:   Seizure ([place])   1. Seizure - this was now the patient's second seizure, not on epileptic  medications previously but started on Keppra here. Patient was without focal neurologic deficits, only postictal symptoms are myalgias, throat pain. MRI negative, UDS, TSH, Upreg all negative. Neurology also recommended an EEG but was unable to be done due to her hair piece and will need to be arranged by outpatient neurology if still needed.  2. New atrial fibrillation - she had transient atrial fibrillation likely induced by her seizures. CHADS-VASC of 2 (female, HTN), so technically she would be a candidate for anticoagulation on discharge, but she had no recurrent episodes after her seizures subsided on ~48 hours of telemetry, so it was thought prudent to defer anticoagulation and consider 30-day event monitor, given the patient remains seizure-free. TTE EF 99991111, grade 2 diastolic dysfunction, mild LAE, mild TR, dilated ascending aorta. Her verapamil was also held as her pressures were much improved and normotensive while inpatient.  Discharge Vitals:   BP 150/80 mmHg  Pulse 82  Temp(Src) 98.4 F (36.9 C) (Oral)  Resp 18  Ht  5\' 10"  (1.778 m)  Wt 239 lb (108.41 kg)  BMI 34.29 kg/m2  SpO2 98%  LMP 02/24/2016  Discharge Labs:  No results found for this or any previous visit (from the past 24 hour(s)).  Signed: [name], MD 03/09/2016, 8:24 AM    Services Ordered on Discharge: None Equipment Ordered on Discharge: None

## 2016-03-14 ENCOUNTER — Telehealth: Payer: Self-pay | Admitting: Internal Medicine

## 2016-03-14 NOTE — Telephone Encounter (Signed)
Patient need TOC discharge date 03/08/16 HFU 03/23/16

## 2016-03-15 ENCOUNTER — Encounter: Payer: Self-pay | Admitting: Internal Medicine

## 2016-03-15 ENCOUNTER — Telehealth: Payer: Self-pay | Admitting: *Deleted

## 2016-03-15 ENCOUNTER — Ambulatory Visit (INDEPENDENT_AMBULATORY_CARE_PROVIDER_SITE_OTHER): Payer: BLUE CROSS/BLUE SHIELD | Admitting: Internal Medicine

## 2016-03-15 VITALS — BP 126/69 | HR 83 | Temp 98.4°F | Wt 240.2 lb

## 2016-03-15 DIAGNOSIS — G40909 Epilepsy, unspecified, not intractable, without status epilepticus: Secondary | ICD-10-CM | POA: Diagnosis not present

## 2016-03-15 DIAGNOSIS — I480 Paroxysmal atrial fibrillation: Secondary | ICD-10-CM | POA: Diagnosis not present

## 2016-03-15 DIAGNOSIS — R569 Unspecified convulsions: Secondary | ICD-10-CM

## 2016-03-15 NOTE — Progress Notes (Signed)
Patient ID: Tammie [name], female   DOB: May 11, 1976, 40 y.o.   MRN: [id number]   Subjective:   Patient ID: Tammie [name] female   DOB: October 20, 1975 40 y.o.   MRN: [id number]  HPI: Tammie [name] is a 40 y.o. female with PMH as below, here for hospital f/u of seizure and new atrial fibrillation.  Please see Problem-Based charting for the status of the patient's chronic medical issues.  Patient presented with second seizure and was started on Keppra while admitted.  Since admission, she has been taking her Keppra 500 mg BID.  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  She denies HA, weakness, or vision changes, except for a short period where she felt she had blurry peripheral vision.  She would like a work note for a short period.  She has been driving to and from work in the interim.  She was also diagnosed with transient afib, thought to be 2/2 the seizure.  She deferred anticoagulation at that time. Since discharge, she has had one episode of palpitations while stressed at work.  She reports checking her rate, which was ~125 bpm.  The episode lasted about 30 minutes.   Past Medical History  Diagnosis Date  . Hypertension   . Fibroid   . Anemia   . Abnormal Pap smear   . Hypopotassemia   . Esophageal reflux   . Palpitations   . Pregnancy induced hypertension   . Dysrhythmia     pvc's  . Asthma     as child  . Obesity    Current Outpatient Prescriptions  Medication Sig Dispense Refill  . acetaminophen (TYLENOL) 500 MG tablet Take 1,000 mg by mouth every 6 (six) hours as needed (pain).    Marland Kitchen albuterol (PROVENTIL HFA;VENTOLIN HFA) 108 (90 BASE) MCG/ACT inhaler Inhale 1-2 puffs into the lungs every 6 (six) hours as needed for wheezing or shortness of breath.    . Calcium Citrate-Vitamin D (CALCIUM + D PO) Take 3 tablets by mouth daily.    . Cyanocobalamin  (VITAMIN B-12 PO) Take 1 tablet by mouth daily.    Marland Kitchen levETIRAcetam (KEPPRA) 500 MG tablet Take 1 tablet (500 mg total) by mouth 2 (two) times daily. 60 tablet 3  . lubiprostone (AMITIZA) 24 MCG capsule Take 1 capsule (24 mcg total) by mouth 2 (two) times daily with a meal. 20 capsule 0  . Multiple Vitamin (MULTIVITAMIN WITH MINERALS) TABS tablet Take 1 tablet by mouth daily.    . polyethylene glycol (MIRALAX / GLYCOLAX) packet Take 17 g by mouth daily. 14 each 0   No current facility-administered medications for this visit.   Family History  Problem Relation Age of Onset  . Hypertension Father   . Kidney disease Father   . Cancer Father   . Diabetes Father   . Diabetes Maternal Grandmother   . Hypertension Paternal Grandmother   . Epilepsy Cousin   . Epilepsy Maternal Aunt   . Epilepsy Maternal Uncle    Social History   Social History  . Marital Status: Single    Spouse Name: N/A  . Number of Children: N/A  . Years of Education: N/A   Social History Main Topics  . Smoking status: Never Smoker   . Smokeless tobacco: Never Used  . Alcohol Use: Yes     Comment: drank last night; does not usually drink  .  Drug Use: No  . Sexual Activity: Yes    Birth Control/ Protection: Condom, Surgical   Other Topics Concern  . Not on file   Social History Narrative   Review of Systems: Pertinent items are noted in HPI. Objective:  Physical Exam: Filed Vitals:   03/15/16 1613  BP: 126/69  Pulse: 83  Temp: 98.4 F (36.9 C)  Weight: 240 lb 3.2 oz (108.954 kg)  SpO2: 100%   Physical Exam  Constitutional: She is oriented to person, place, and time and well-developed, well-nourished, and in no distress. No distress.  Tearing, but NAD, appropriately responsive.  HENT:  Head: Normocephalic and atraumatic.  Eyes: EOM are normal. Pupils are equal, round, and reactive to light. No scleral icterus.  Neck: No tracheal deviation present.  Cardiovascular: Normal rate, regular rhythm and  normal heart sounds.   Regular rate and rhythm.  Pulmonary/Chest: Effort normal and breath sounds normal. No stridor. No respiratory distress. She has no wheezes. She has no rales.  Musculoskeletal: She exhibits no edema.  Neurological: She is alert and oriented to person, place, and time.  CNII-XII intact to bedside testing. Strength 5/5 in b/l UE and LE.  Skin: Skin is warm and dry. She is not diaphoretic.     Assessment & Plan:   Patient and case were discussed with Dr. Lynnae January.  Please refer to Problem Based charting for further documentation.

## 2016-03-15 NOTE — Patient Instructions (Signed)
1. Follow up with Neurology to have the EEG.  MAKE SURE YOUR WEAVE IS REMOVED PRIOR TO EEG APPOINTMENT. 2. Follow up to have your Event Monitor placed as soon as possible.  It will help your Cardiologist make treatment decisions. 3. DO NOT DRIVE.  Epilepsy Epilepsy is a disorder in which a person has repeated seizures over time. A seizure is a release of abnormal electrical activity in the brain. Seizures can cause a change in attention, behavior, or the ability to remain awake and alert (altered mental status). Seizures often involve uncontrollable shaking (convulsions).  Most people with epilepsy lead normal lives. However, people with epilepsy are at an increased risk of falls, accidents, and injuries. Therefore, it is important to begin treatment right away. CAUSES  Epilepsy has many possible causes. Anything that disturbs the normal pattern of brain cell activity can lead to seizures. This may include:   Head injury.  Birth trauma.  High fever as a child.  Stroke.  Bleeding into or around the brain.  Certain drugs.  Prolonged low oxygen, such as what occurs after CPR efforts.  Abnormal brain development.  Certain illnesses, such as meningitis, encephalitis (brain infection), malaria, and other infections.  An imbalance of nerve signaling chemicals (neurotransmitters).  SIGNS AND SYMPTOMS  The symptoms of a seizure can vary greatly from one person to another. Right before a seizure, you may have a warning (aura) that a seizure is about to occur. An aura may include the following symptoms:  Fear or anxiety.  Nausea.  Feeling like the room is spinning (vertigo).  Vision changes, such as seeing flashing lights or spots. Common symptoms during a seizure include:  Abnormal sensations, such as an abnormal smell or a bitter taste in the mouth.   Sudden, general body stiffness.   Convulsions that involve rhythmic jerking of the face, arm, or leg on one or both sides.    Sudden change in consciousness.   Appearing to be awake but not responding.   Appearing to be asleep but cannot be awakened.   Grimacing, chewing, lip smacking, drooling, tongue biting, or loss of bowel or bladder control. After a seizure, you may feel sleepy for a while. DIAGNOSIS  Your health care provider will ask about your symptoms and take a medical history. Descriptions from any witnesses to your seizures will be very helpful in the diagnosis. A physical exam, including a detailed neurological exam, is necessary. Various tests may be done, such as:   An electroencephalogram (EEG). This is a painless test of your brain waves. In this test, a diagram is created of your brain waves. These diagrams can be interpreted by a specialist.  An MRI of the brain.   A CT scan of the brain.   A spinal tap (lumbar puncture, LP).  Blood tests to check for signs of infection or abnormal blood chemistry. TREATMENT  There is no cure for epilepsy, but it is generally treatable. Once epilepsy is diagnosed, it is important to begin treatment as soon as possible. For most people with epilepsy, seizures can be controlled with medicines. The following may also be used:  A pacemaker for the brain (vagus nerve stimulator) can be used for people with seizures that are not well controlled by medicine.  Surgery on the brain. For some people, epilepsy eventually goes away. HOME CARE INSTRUCTIONS   Follow your health care provider's recommendations on driving and safety in normal activities.  Get enough rest. Lack of sleep can cause seizures.  Only take over-the-counter or prescription medicines as directed by your health care provider. Take any prescribed medicine exactly as directed.  Avoid any known triggers of your seizures.  Keep a seizure diary. Record what you recall about any seizure, especially any possible trigger.   Make sure the people you live and work with know that you are  prone to seizures. They should receive instructions on how to help you. In general, a witness to a seizure should:   Cushion your head and body.   Turn you on your side.   Avoid unnecessarily restraining you.   Not place anything inside your mouth.   Call for emergency medical help if there is any question about what has occurred.   Follow up with your health care provider as directed. You may need regular blood tests to monitor the levels of your medicine.  SEEK MEDICAL CARE IF:   You develop signs of infection or other illness. This might increase the risk of a seizure.   You seem to be having more frequent seizures.   Your seizure pattern is changing.  SEEK IMMEDIATE MEDICAL CARE IF:   You have a seizure that does not stop after a few moments.   You have a seizure that causes any difficulty in breathing.   You have a seizure that results in a very severe headache.   You have a seizure that leaves you with the inability to speak or use a part of your body.    This information is not intended to replace advice given to you by your health care provider. Make sure you discuss any questions you have with your health care provider.   Document Released: 09/26/2005 Document Revised: 07/17/2013 Document Reviewed: 05/08/2013 Elsevier Interactive Patient Education 2016 Else[employer] Inc[employer]  Atrial Fibrillation Atrial fibrillation is a type of irregular or rapid heartbeat (arrhythmia). In atrial fibrillation, the heart quivers continuously in a chaotic pattern. This occurs when parts of the heart receive disorganized signals that make the heart unable to pump blood normally. This can increase the risk for stroke, heart failure, and other heart-related conditions. There are different types of atrial fibrillation, including:  Paroxysmal atrial fibrillation. This type starts suddenly, and it usually stops on its own shortly after it starts.  Persistent atrial fibrillation. This  type often lasts longer than a week. It may stop on its own or with treatment.  Long-lasting persistent atrial fibrillation. This type lasts longer than 12 months.  Permanent atrial fibrillation. This type does not go away. Talk with your health care provider to learn about the type of atrial fibrillation that you have. CAUSES This condition is caused by some heart-related conditions or procedures, including:  A heart attack.  Coronary artery disease.  Heart failure.  Heart valve conditions.  High blood pressure.  Inflammation of the sac that surrounds the heart (pericarditis).  Heart surgery.  Certain heart rhythm disorders, such as Wolf-Parkinson-White syndrome. Other causes include:  Pneumonia.  Obstructive sleep apnea.  Blockage of an artery in the lungs (pulmonary embolism, or PE).  Lung cancer.  Chronic lung disease.  Thyroid problems, especially if the thyroid is overactive (hyperthyroidism).  Caffeine.  Excessive alcohol use or illegal drug use.  Use of some medicines, including certain decongestants and diet pills. Sometimes, the cause cannot be found. RISK FACTORS This condition is more likely to develop in:  People who are older in age.  People who smoke.  People who have diabetes mellitus.  People who are overweight (obese).  Athletes who exercise vigorously. SYMPTOMS Symptoms of this condition include:  A feeling that your heart is beating rapidly or irregularly.  A feeling of discomfort or pain in your chest.  Shortness of breath.  Sudden light-headedness or weakness.  Getting tired easily during exercise. In some cases, there are no symptoms. DIAGNOSIS Your health care provider may be able to detect atrial fibrillation when taking your pulse. If detected, this condition may be diagnosed with:  An electrocardiogram (ECG).  A Holter monitor test that records your heartbeat patterns over a 24-hour period.  Transthoracic  echocardiogram (TTE) to evaluate how blood flows through your heart.  Transesophageal echocardiogram (TEE) to view more detailed images of your heart.  A stress test.  Imaging tests, such as a CT scan or chest X-ray.  Blood tests. TREATMENT The main goals of treatment are to prevent blood clots from forming and to keep your heart beating at a normal rate and rhythm. The type of treatment that you receive depends on many factors, such as your underlying medical conditions and how you feel when you are experiencing atrial fibrillation. This condition may be treated with:  Medicine to slow down the heart rate, bring the heart's rhythm back to normal, or prevent clots from forming.  Electrical cardioversion. This is a procedure that resets your heart's rhythm by delivering a controlled, low-energy shock to the heart through your skin.  Different types of ablation, such as catheter ablation, catheter ablation with pacemaker, or surgical ablation. These procedures destroy the heart tissues that send abnormal signals. When the pacemaker is used, it is placed under your skin to help your heart beat in a regular rhythm. HOME CARE INSTRUCTIONS  Take over-the counter and prescription medicines only as told by your health care provider.  If your health care provider prescribed a blood-thinning medicine (anticoagulant), take it exactly as told. Taking too much blood-thinning medicine can cause bleeding. If you do not take enough blood-thinning medicine, you will not have the protection that you need against stroke and other problems.  Do not use tobacco products, including cigarettes, chewing tobacco, and e-cigarettes. If you need help quitting, ask your health care provider.  If you have obstructive sleep apnea, manage your condition as told by your health care provider.  Do not drink alcohol.  Do not drink beverages that contain caffeine, such as coffee, soda, and tea.  Maintain a healthy  weight. Do not use diet pills unless your health care provider approves. Diet pills may make heart problems worse.  Follow diet instructions as told by your health care provider.  Exercise regularly as told by your health care provider.  Keep all follow-up visits as told by your health care provider. This is important. PREVENTION  Avoid drinking beverages that contain caffeine or alcohol.  Avoid certain medicines, especially medicines that are used for breathing problems.  Avoid certain herbs and herbal medicines, such as those that contain ephedra or ginseng.  Do not use illegal drugs, such as cocaine and amphetamines.  Do not smoke.  Manage your high blood pressure. SEEK MEDICAL CARE IF:  You notice a change in the rate, rhythm, or strength of your heartbeat.  You are taking an anticoagulant and you notice increased bruising.  You tire more easily when you exercise or exert yourself. SEEK IMMEDIATE MEDICAL CARE IF:  You have chest pain, abdominal pain, sweating, or weakness.  You feel nauseous.  You notice blood in your vomit, bowel movement, or urine.  You have shortness  of breath.  You suddenly have swollen feet and ankles.  You feel dizzy.  You have sudden weakness or numbness of the face, arm, or leg, especially on one side of the body.  You have trouble speaking, trouble understanding, or both (aphasia).  Your face or your eyelid droops on one side. These symptoms may represent a serious problem that is an emergency. Do not wait to see if the symptoms will go away. Get medical help right away. Call your local emergency services (911 in the [place] Do not drive yourself to the hospital.   This information is not intended to replace advice given to you by your health care provider. Make sure you discuss any questions you have with your health care provider.   Document Released: 09/26/2005 Document Revised: 06/17/2015 Document Reviewed: 01/21/2015 Elsevier  Interactive Patient Education [employer].

## 2016-03-15 NOTE — Telephone Encounter (Signed)
Patient called stating that she had been to work today and was feeling very fatigued. States that her employer kept asking "are you okay, you don't seem yourself and you sound different". Per patient, employer requested that she be evaluated before returning to work. Patient's speech was clear, denied any headache, or unilateral weakness, alert and oriented. Added patient to 3:45 slot with Dr. Loura Back.

## 2016-03-15 NOTE — Telephone Encounter (Signed)
Transition Care Management Follow-up Telephone Call      Date discharged? 03/08/2016   How have you been since you were released from the hospital? No seizure activity, but feeling "weak and not like myself" No chest pain or shortness of breat but "I can feel the A. Fib. Sometimes, but it goes away"   Do you understand why you were in the hospital? Yes for my seizure and A. Fib   Do you understand the discharge instructions? Yes   Where were you discharged to? Home   Items Reviewed:  Medications reviewed: Yes  Allergies reviewed:No   Dietary changes reviewed: No  Referrals reviewed: no   Functional Questionnaire:   Activities of Daily Living (ADLs):   She states they are independent in the following: ambulation, bathing and hygiene, feeding, continence, grooming, toileting and dressing States they require assistance with the following: N/A   Any transportation issues/concerns?: no   Any patient concerns? yes, weakness    Confirmed importance and date/time of follow-up visits scheduled yes  Provider Appointment booked with Dr. Merrilyn Puma  Confirmed with patient if condition begins to worsen call PCP or go to the ER.  Patient was given the office number and encouraged to call back with question or concerns.  : yes

## 2016-03-15 NOTE — Assessment & Plan Note (Signed)
A/P: Epilepsy, on Keppra.  Patient reports still feeling tired, groggy, unable to concentrate.  She denies repeat seizures, but symptoms still c/f postictal symptoms.  She has not had an EEG yet.  She continues to drive. Patient counseled extensively on the legality and dangers of driving, and she was explicitly told not to drive for 6 months. Work note provided until 03/24/16, with another clinic visit on 03/23/16. - Keppra 500 mg BID - Neurology referral, including EEG. Patient told to have hair weave removed prior to appointment - NO DRIVING

## 2016-03-15 NOTE — Assessment & Plan Note (Signed)
Patient had one repeat episode of palpitations concerning for repeat afib paroxysm.  She has a cardiology appointment on 7/12.  Due to her repeat episode, will try to have 30 day event monitor placed so that treatment decisions can be made at time of Cardiology appointment. We still agree that it is reasonable to withhold anticoagulation, especially given her regular rhythm in clinic today. - Cardiology follow up - 30 day event monitor

## 2016-03-16 NOTE — Progress Notes (Signed)
Internal Medicine Clinic Attending  Case discussed with Dr. Taylor at the time of the visit.  We reviewed the resident's history and exam and pertinent patient test results.  I agree with the assessment, diagnosis, and plan of care documented in the resident's note. 

## 2016-03-17 ENCOUNTER — Other Ambulatory Visit: Payer: Self-pay | Admitting: Internal Medicine

## 2016-03-17 ENCOUNTER — Ambulatory Visit (INDEPENDENT_AMBULATORY_CARE_PROVIDER_SITE_OTHER): Payer: BLUE CROSS/BLUE SHIELD

## 2016-03-17 DIAGNOSIS — I480 Paroxysmal atrial fibrillation: Secondary | ICD-10-CM | POA: Diagnosis not present

## 2016-03-17 DIAGNOSIS — R002 Palpitations: Secondary | ICD-10-CM | POA: Diagnosis not present

## 2016-03-17 DIAGNOSIS — R000 Tachycardia, unspecified: Secondary | ICD-10-CM

## 2016-03-18 ENCOUNTER — Telehealth: Payer: Self-pay

## 2016-03-18 NOTE — Telephone Encounter (Signed)
Received call from [facility] pt HR at 9:45am was 194 with 10 beats of V. Tach; pt pressed button stating that she was feeling palpitations and fluttering feeling. Pt now back in NSR with HR of 85.  Pt was in bed, did not feel faint nor did she pass out at any time. Pt monitor placed yesterday, this is first event since monitor was placed yesterday. Pt has no c/o at this time she if feeling fine. She is not taking any BP medications as changes were made during her ED visit by neuro on 5/28 as she was experiencing seizures and decrease consciousness.  Pt instructed to continue with same regimen will send message to MD to review.

## 2016-03-19 NOTE — Telephone Encounter (Signed)
She needs to be seen by EP

## 2016-03-21 ENCOUNTER — Ambulatory Visit (INDEPENDENT_AMBULATORY_CARE_PROVIDER_SITE_OTHER): Payer: BLUE CROSS/BLUE SHIELD | Admitting: Neurology

## 2016-03-21 ENCOUNTER — Telehealth: Payer: Self-pay | Admitting: Nurse Practitioner

## 2016-03-21 ENCOUNTER — Encounter: Payer: Self-pay | Admitting: Neurology

## 2016-03-21 VITALS — BP 120/80 | HR 85 | Ht 70.0 in | Wt 240.4 lb

## 2016-03-21 DIAGNOSIS — G40909 Epilepsy, unspecified, not intractable, without status epilepticus: Secondary | ICD-10-CM

## 2016-03-21 NOTE — Procedures (Signed)
HPI:  40 y/o with new onset seizure  TECHNICAL SUMMARY:  A multichannel referential and bipolar montage EEG using the standard international 10-20 system was performed on the patient described as briefly awake, drowsy and asleep.  The dominant background activity consists of 9-10 hertz activity seen most prominantly over the posterior head region.  The backgound activity is reactive to eye opening and closing procedures.  Low voltage fast (beta) activity is distributed symmetrically and maximally over the anterior head regions.  There is significant artifact over the T6 electrode.  ACTIVATION:  Stepwise photic stimulation at 4-20 flashes per second was performed and did not elicit any abnormal waveforms.  Hyperventilation was performed for 3 minutes with ,at times, poor effort (was in stage II sleep during portions of this) and did not reveal any abnormalities.  EPILEPTIFORM ACTIVITY:  There were no spikes, sharp waves or paroxysmal activity.  SLEEP:  Both stage I and stage II sleep were noted.  CARDIAC:  The EKG lead revealed a regular sinus rhythm.  IMPRESSION:  This is a normal EEG for the patients stated age.  There were no focal, hemispheric or lateralizing features.  No epileptiform activity was recorded.  A normal EEG does not exclude the diagnosis of a seizure disorder and if seizure remains high on the list of differential diagnosis, an ambulatory EEG may be of value.  Clinical correlation is required.

## 2016-03-21 NOTE — Progress Notes (Signed)
[facility] Clinic Note - Initial Visit   Date: 03/21/2016  ARYAN BELLO MRN: [id number] DOB: 1975/10/30   Dear Dr Lovena Le:  Thank you for your kind referral of BRISTYL MCLEES for consultation of seizures. Although her history is well known to you, please allow [place] to reiterate it for the purpose of our medical record. The patient was accompanied to the clinic by son who also provides collateral information.     History of Present Illness: Tammie [name] is a 40 y.o. right-handed African American female with obesity s/p gastric sleeve with 57lb weight loss (October 2016) presenting for evaluation of new onset seizure disorder.    She had her first seizure in April 2017.  Her sons had noticed that she was not awake by 10am which is very unusual for her so went to check on her and her eldest son found her in grunting in her bed and staring at him.  She was not responding to him and was very sleepy. Her does not recall seeing abnormal movements, but states that it took about 2 minutes for her to respond and answer questions.  She is amnestic of the event.  She did have urinary he reports wetting her bed and waking up with her tongue bleeding. She recalls having a lot of body aches and being confused. Her son called EMS where she was evaluated in the ED.  She had another spell on May 28th, where again, she was found by her mother unconscious in her bed, again with urinary incontinence, and woke up on a bloody pillow.  On event, she recalls having a racing heart rate.  Duration of these spells is unknown since it occurs at night.  She reports being extremely tired with generalized myalgias.  She was admitted to [facility] on 5/28 - 03/08/2016 for evaluation. When she presented to the ER, patient was somnolent and noted to be in atrial fibrillation, which self converted later that day. She underwent MRI brain which was normal and started on Keppra 514m  twice daily.  She gets tired with Keppra, but it is less so than when she first started the medication.  She has not had any further spells.  She is seeing cardiology for atrial fibrillation and has a 30-day cardiac monitor.    She recalls having spells of staring for several years with reduced peripheral vision. She would get really tired and fatigued following these spells.  There was no abnormal movements. She always attributed this to her PCP.   She reports taking verapamil three times daily for blood pressure but since loosing 57lb her blood pressure has normalized.  She feels that her blood pressure was getting too low at night time which may have triggered her seizures.    The patient has no aura.      Seizure triggers include stress.  For the past two months, she has been working overtime as a dEngineer, manufacturing  Birth and Early Development:  Met all developmental milestones on time.  Pregnancy was uncomplicated.  RISK FACTORS FOR SEIZURES:   1. Head Trauma (No); 2. CNS Infections (No); 3. Family History of Seizures Yes (counsins and aunts); 4. Developmental Delay (No); 5. Febrile Seizures (No); 6. CNS Tumors (No); 7. CNS Vascular Disease (No); 8. Significant Medical History (No)  CURRENT ANTICONVULSANTS: Keppra 5019mtwice daily  The patient forgets a dose: rarely The patient's side effects to the current medications: fatigue   Out-side paper records, electronic medical  record, and images have been reviewed where available and summarized as:  Lab Results  Component Value Date   TSH 0.561 03/06/2016   Lab Results  Component Value Date   HGBA1C 4.7 11/16/2011   MRI brain wo contrast 03/06/2016:  Motion degraded unenhanced examination without seizure focus identified.   Past Medical History  Diagnosis Date  . Hypertension   . Fibroid   . Anemia   . Abnormal Pap smear   . Hypopotassemia   . Esophageal reflux   . Palpitations   . Pregnancy induced hypertension   .  Dysrhythmia     pvc's  . Asthma     as child  . Obesity     Past Surgical History  Procedure Laterality Date  . Tubal ligation    . Colposcopy    . Oophorectomy      Left  . Laparoscopic gastric sleeve resection       Medications:  Outpatient Encounter Prescriptions as of 03/21/2016  Medication Sig  . acetaminophen (TYLENOL) 500 MG tablet Take 1,000 mg by mouth every 6 (six) hours as needed (pain).  Marland Kitchen albuterol (PROVENTIL HFA;VENTOLIN HFA) 108 (90 BASE) MCG/ACT inhaler Inhale 1-2 puffs into the lungs every 6 (six) hours as needed for wheezing or shortness of breath.  . Calcium Citrate-Vitamin D (CALCIUM + D PO) Take 3 tablets by mouth daily.  . Cyanocobalamin (VITAMIN B-12 PO) Take 1 tablet by mouth daily.  Marland Kitchen levETIRAcetam (KEPPRA) 500 MG tablet Take 1 tablet (500 mg total) by mouth 2 (two) times daily.  Marland Kitchen lubiprostone (AMITIZA) 24 MCG capsule Take 1 capsule (24 mcg total) by mouth 2 (two) times daily with a meal.  . Multiple Vitamin (MULTIVITAMIN WITH MINERALS) TABS tablet Take 1 tablet by mouth daily.  . polyethylene glycol (MIRALAX / GLYCOLAX) packet Take 17 g by mouth daily.   No facility-administered encounter medications on file as of 03/21/2016.     Allergies:  Allergies  Allergen Reactions  . Sulfa Antibiotics Anaphylaxis, Rash and Other (See Comments)    "Burning"  . Sulfamethoxazole Anaphylaxis, Rash and Other (See Comments)    burning    Family History: Family History  Problem Relation Age of Onset  . Hypertension Father   . Kidney disease Father   . Cancer Father   . Diabetes Father   . Diabetes Maternal Grandmother   . Hypertension Paternal Grandmother   . Epilepsy Cousin   . Epilepsy Maternal Aunt   . Epilepsy Maternal Uncle   . Febrile seizures Son   . Asthma Son     Social History: Social History  Substance Use Topics  . Smoking status: Never Smoker   . Smokeless tobacco: Never Used  . Alcohol Use: 0.0 oz/week    0 Standard drinks or  equivalent per week     Comment: drank last night; does not usually drink   Social History   Social History Narrative   Lives with children in an apartment on the second floor.  Has 2 sons.  Works as Chief Strategy Officer.  Education: 2 1/2 years of college.      Review of Systems:  CONSTITUTIONAL: No fevers, chills, night sweats, or weight loss.   EYES: No visual changes or eye pain ENT: No hearing changes.  No history of nose bleeds.   RESPIRATORY: No cough, wheezing and shortness of breath.   CARDIOVASCULAR: Negative for chest pain, and palpitations.   GI: Negative for abdominal discomfort, blood in stools or black stools.  No recent  change in bowel habits.   GU:  No history of incontinence.   MUSCLOSKELETAL: No history of joint pain or swelling.  No myalgias.   SKIN: Negative for lesions, rash, and itching.   HEMATOLOGY/ONCOLOGY: Negative for prolonged bleeding, bruising easily, and swollen nodes.  No history of cancer.   ENDOCRINE: Negative for cold or heat intolerance, polydipsia or goiter.   PSYCH:  No depression or anxiety symptoms.   NEURO: As Above.   Vital Signs:  BP 120/80 mmHg  Pulse 85  Ht 5' 10" (1.778 m)  Wt 240 lb 7 oz (109.062 kg)  BMI 34.50 kg/m2  SpO2 97%  LMP 02/24/2016 Pain Scale: 0 on a scale of 0-10   General Medical Exam:   General:  Well appearing, comfortable.   Eyes/ENT: see cranial nerve examination.   Neck: No masses appreciated.  Full range of motion without tenderness.  No carotid bruits. Respiratory:  Clear to auscultation, good air entry bilaterally.   Cardiac:  Regular rate and rhythm, no murmur.   Extremities:  No deformities, edema, or skin discoloration.  Skin:  No rashes or lesions.  Neurological Exam: MENTAL STATUS including orientation to time, place, person, recent and remote memory, attention span and concentration, language, and fund of knowledge is normal.  Speech is not dysarthric.  CRANIAL NERVES: II:  No visual field defects.   Unremarkable fundi.   III-IV-VI: Pupils equal round and reactive to light.  Normal conjugate, extra-ocular eye movements in all directions of gaze.  No nystagmus.  No ptosis.   V:  Normal facial sensation.  Jaw jerk is absent.   VII:  Normal facial symmetry and movements.  No pathologic facial reflexes.  VIII:  Normal hearing and vestibular function.   IX-X:  Normal palatal movement.   XI:  Normal shoulder shrug and head rotation.   XII:  Normal tongue strength and range of motion, no deviation or fasciculation.  MOTOR:  No atrophy, fasciculations or abnormal movements.  No pronator drift.  Tone is normal.    Right Upper Extremity:    Left Upper Extremity:    Deltoid  5/5   Deltoid  5/5   Biceps  5/5   Biceps  5/5   Triceps  5/5   Triceps  5/5   Wrist extensors  5/5   Wrist extensors  5/5   Wrist flexors  5/5   Wrist flexors  5/5   Finger extensors  5/5   Finger extensors  5/5   Finger flexors  5/5   Finger flexors  5/5   Dorsal interossei  5/5   Dorsal interossei  5/5   Abductor pollicis  5/5   Abductor pollicis  5/5   Tone (Ashworth scale)  0  Tone (Ashworth scale)  0   Right Lower Extremity:    Left Lower Extremity:    Hip flexors  5/5   Hip flexors  5/5   Hip extensors  5/5   Hip extensors  5/5   Knee flexors  5/5   Knee flexors  5/5   Knee extensors  5/5   Knee extensors  5/5   Dorsiflexors  5/5   Dorsiflexors  5/5   Plantarflexors  5/5   Plantarflexors  5/5   Toe extensors  5/5   Toe extensors  5/5   Toe flexors  5/5   Toe flexors  5/5   Tone (Ashworth scale)  0  Tone (Ashworth scale)  0   MSRs:  Right  Left brachioradialis 2+  brachioradialis 2+  biceps 2+  biceps 2+  triceps 2+  triceps 2+  patellar 2+  patellar 2+  ankle jerk 2+  ankle jerk 2+  Hoffman no  Hoffman no  plantar response down  plantar response down   SENSORY:  Normal and symmetric perception of light touch, pinprick, vibration, and  proprioception.  Romberg's sign absent.   COORDINATION/GAIT: Normal finger-to- nose-finger.  Intact rapid alternating movements bilaterally.  Able to rise from a chair without using arms.  Gait narrow based and stable. Tandem and stressed gait intact.    IMPRESSION: Ms. Octaviano Glow is a pleasant 40 year-old female referred for evaluation of generalized seizure disorder.  Her neurological exam is normal.  MRI brain does not show any abnormalities.  She has had two seizures associated with tongue biting, incontinence, and postictal confusion and fatigue, both occuring during sleep.  She will have EEG performed to better classify her seizure disorder, but based on her history, this is generalized.  In the meantime, she will continue Keppra 535m twice daily which is she tolerating fairly well.    Regarding the onset of atrial fibrillation following her seizure, I agree with Dr. KBirder Robsonthat this is most likely due an ictal phenomenon moreso than cardioembolic ischemic infarct causing seizure.   PLAN/RECOMMENDATIONS:  1.  Continue Keppra 5084mtwice daily 2.  Routine EEG 3.  [place] driving laws were discussed with the patient, and she knows to stop driving after an episode of loss of consciousness, until 6 months event-free. 4.  Letter provided for work restrictions  Return to clinic in 2-3 months   The duration of this appointment visit was 60 minutes of face-to-face time with the patient.  Greater than 50% of this time was spent in counseling, explanation of diagnosis, planning of further management, and coordination of care.   Thank you for allowing me to participate in patient's care.  If I can answer any additional questions, I would be pleased to do so.    Sincerely,    Donika K. PaPosey ProntoDO

## 2016-03-21 NOTE — Patient Instructions (Addendum)
1.  Continue Keppra 500mg  twice daily 2.  Routine EEG 3.  [place] driving laws were discussed with the patient, and she knows to stop driving after an episode of loss of consciousness, until 6 months event-free. 4.  Letter provided for work restrictions  Return to clinic in 2-3 months  Seizure Precautions:  1. If medication has been prescribed for you to prevent seizures, take it exactly as directed. Do not stop taking the medicine without talking to your doctor first, even if you have not had a seizure in a long time.  2. Avoid activities in which a seizure would cause danger to yourself or to others. Don't operate dangerous machinery, swim alone, or climb in high or dangerous places, such as on ladders, roofs, or girders. Do not drive unless your doctor says you may.  3. If you have any warning that you may have a seizure, lay down in a safe place where you can't hurt yourself.  4. No driving for 6 months from last seizure, as per [facility]. Please refer to the following link on the [place] website for more information: http://www.epilepsyfoundation.org/answerplace/Social/driving/drivingu.cfm  5. Maintain good sleep hygiene.  6. Notify your neurology if you are planning pregnancy or if you become pregnant.  7. Contact your doctor if you have any problems that may be related to the medicine you are taking.  8. Call 911 and bring the patient back to the ED if:  A. The seizure lasts longer than 5 minutes.  B. The patient doesn't awaken shortly after the seizure  C. The patient has new problems such as difficulty seeing, speaking or moving  D. The patient was injured during the seizure  E. The patient has a temperature over 102 F (39C)  F. The patient vomited and now is having trouble breathing   SEIZURE PRECAUTIONS  Bathroom Safety  A person with seizures may want to shower instead of bathe to avoid accidental drowning. If falls occur during the patient's  typical seizure, a person should use a shower seat, preferably one with a safety strap.  . Use nonskid strips in your shower or tub.  . Never use electrical equipment near water. This prevents accidental electrocution.  . Consider changing glass in shower doors to shatterproof glass.   Risk analyst . If possible, cook when someone else is nearby.  . Use the back burners of the stove to prevent accidental burns.  . Use shatterproof containers as much as possible. For instance, sauces can be transferred from glass bottles to plastic containers for use.  . Limit time that is required using knives or other sharp objects. If possible, buy foods that are already cut, or ask someone to help in meal preparation.   General Safety at Home . Do not smoke or light fires in the fireplace unless someone else is present.  . Do not use space heaters that can be accidentally overturned.  . When alone, avoid using step stools or ladders, and do not clean rooftop gutters.  . Purchase power tools and motorized lawn equipment which have a safety switch that will stop the machine if you release the handle (a 'dead man's' switch).   Driving and Transportation . Avoid driving unless your seizures are well controlled and/or you have permission to drive from your state's Department of Motor Vehicles  [facility]). Each state has different laws. Please refer to the following link on the [place] website for more information: http://www.epilepsyfoundation.org/answerplace/Social/driving/drivingu.cfm  .  If you ride a bicycle, wear a helmet and any other necessary protective gear.  . When taking public transportation like the bus or subway, stay clear of the platform edge.   Outdoor Dentist . Swimming is okay, but does present certain risks. Never swim alone, and tell friends what to do if you have a seizure while swimming.  . Wear appropriate protective equipment.  . Ski with a friend. If a  seizure occurs, your friend can seek help, if needed. He or she can also help to get you out of the cold. Consider using a safety hook or belt while riding the ski lift.   Safety Issues for Parents . Feed, nurse, dress, and change your infant while sitting on the floor, in a well-protected area.  Yevette Edwards your house as much as possible. If you are home alone with your child, consider using a safe play area or playpen. Use child safety gates to prevent a child from falling down stairs or to prevent your child from wandering in the event that you have a seizure.  . As your child grows, explain what seizures are in terms that he or she can understand. Some people perform 'seizure drills.' Many people teach their children how to call 911 in an emergency.

## 2016-03-22 ENCOUNTER — Encounter: Payer: Self-pay | Admitting: *Deleted

## 2016-03-22 NOTE — Telephone Encounter (Signed)
Pt made aware that scheduling will be in contact with her to setup appt with EP.  Pt verbalized understanding, no additional questions at this time.

## 2016-03-23 ENCOUNTER — Ambulatory Visit: Payer: BLUE CROSS/BLUE SHIELD | Admitting: Internal Medicine

## 2016-03-25 ENCOUNTER — Ambulatory Visit (INDEPENDENT_AMBULATORY_CARE_PROVIDER_SITE_OTHER): Payer: BLUE CROSS/BLUE SHIELD | Admitting: Internal Medicine

## 2016-03-25 ENCOUNTER — Encounter: Payer: Self-pay | Admitting: Internal Medicine

## 2016-03-25 VITALS — BP 128/70 | HR 81 | Ht 70.0 in | Wt 241.0 lb

## 2016-03-25 DIAGNOSIS — I10 Essential (primary) hypertension: Secondary | ICD-10-CM

## 2016-03-25 DIAGNOSIS — R931 Abnormal findings on diagnostic imaging of heart and coronary circulation: Secondary | ICD-10-CM

## 2016-03-25 DIAGNOSIS — I472 Ventricular tachycardia: Secondary | ICD-10-CM | POA: Insufficient documentation

## 2016-03-25 DIAGNOSIS — I480 Paroxysmal atrial fibrillation: Secondary | ICD-10-CM

## 2016-03-25 DIAGNOSIS — I4729 Other ventricular tachycardia: Secondary | ICD-10-CM | POA: Insufficient documentation

## 2016-03-25 MED ORDER — DILTIAZEM HCL 30 MG PO TABS: 30.0000 mg | ORAL_TABLET | Freq: Four times a day (QID) | ORAL | Status: DC

## 2016-03-25 NOTE — Patient Instructions (Addendum)
Medication Instructions:  Your physician has recommended you make the following change in your medication:  1) Start Cardizem 30 mg - Take 1 tablet by mouth every six hours as needed    Labwork: None ordered      Testing/Procedures:  MRA of the chest with and without to follow up on thoracic aneurysm      Follow-Up: Your physician wants you to follow-up : Keep scheduled appointment with Truitt Merle, NP 04/20/16 at 9:00    If you need a refill on your cardiac medications before your next appointment, please call your pharmacy.

## 2016-03-25 NOTE — Progress Notes (Signed)
Electrophysiology Office Note   Date:  03/25/2016   ID:  Tammie [name], DOB 1976-04-30, MRN [id number]  PCP:  She is unaware Cardiologist:  Previously Tammie [name] Primary Electrophysiologist: Tammie Grayer, MD    Chief Complaint  Patient presents with  . Atrial Fibrillation  . Shortness of Breath     History of Present Illness: Tammie [name] is a 40 y.o. female who presents today for electrophysiology evaluation.   She has recently been found to have NSVT at afib on 30 day monitor and is referred for EP evaluation.  She reports initially having tachycardia diagnosed in her 34s after having tachypalpitations.  She did well with verapamil.  She was followed by Tammie [name].   More recently, she has been diagnosed with seizure disorder after having loss of consciousness.  She had seizure 4/17 for which she was evaluated at [facility].  She was sleeping and was then found by her son to be awake but unresponsive.   She had bitten her tongue and had urinary incontinence.  She also had diffuse muscle aches. She was evaluated by neurology. 03/06/16, she was found by her mother to be acting "strange".  She had again bitten her tongue and had urinary incontinence.  While recovering from this episode, she had abrupt heart "racing" and was brought to [facility] where she was found to have atrial fibrillation.  Her atrial fibrillation stopped on its own.  She has had rare palpitations since that time.  She is currently wearing an event monitor.  This has documented a 10 beat run of NSVT6/8/17 at 2:12.  She reports waking with abrupt breathlessness with this event.  She has had no other symptomatic arrhythmias.  She has had a similar feeling since a teenager.  She doesn't think that verapamil was very helpful.  Today, she denies symptoms of chest pain, shortness of breath, orthopnea, PND, lower extremity edema, claudication, dizziness, presyncope, syncope, bleeding. The patient is tolerating medications  without difficulties and is otherwise without complaint today.    Past Medical History  Diagnosis Date  . Hypertension   . Fibroid   . Anemia   . Abnormal Pap smear   . Hypopotassemia   . Esophageal reflux   . Palpitations   . Pregnancy induced hypertension   . Asthma     as child  . Obesity     s/p gastric sleeve  . Paroxysmal atrial fibrillation (HCC)   . Premature ventricular contraction   . Seizure ([place])   . Aortic root enlargement ([place]) 5/17    5.1cm thoracic aorta by echo   Past Surgical History  Procedure Laterality Date  . Tubal ligation    . Colposcopy    . Oophorectomy      Left  . Laparoscopic gastric sleeve resection  9/16    [name]     Current Outpatient Prescriptions  Medication Sig Dispense Refill  . acetaminophen (TYLENOL) 500 MG tablet Take 1,000 mg by mouth every 6 (six) hours as needed (pain).    Marland Kitchen albuterol (PROVENTIL HFA;VENTOLIN HFA) 108 (90 BASE) MCG/ACT inhaler Inhale 1-2 puffs into the lungs every 6 (six) hours as needed for wheezing or shortness of breath.    . Calcium Citrate-Vitamin D (CALCIUM + D PO) Take 3 tablets by mouth daily.    . Cyanocobalamin (VITAMIN B-12 PO) Take 1 tablet by mouth daily.    Marland Kitchen levETIRAcetam (KEPPRA) 500 MG tablet Take 1 tablet (500 mg total) by mouth 2 (  two) times daily. 60 tablet 3  . lubiprostone (AMITIZA) 24 MCG capsule Take 1 capsule (24 mcg total) by mouth 2 (two) times daily with a meal. 20 capsule 0  . Multiple Vitamin (MULTIVITAMIN WITH MINERALS) TABS tablet Take 1 tablet by mouth daily.     No current facility-administered medications for this visit.    Allergies:   Sulfa antibiotics and Sulfamethoxazole   Social History:  The patient  reports that she has never smoked. She has never used smokeless tobacco. She reports that she drinks alcohol. She reports that she does not use illicit drugs.   Family History:  The patient's  family history includes Asthma in her son; Cancer in her  father; Diabetes in her father and maternal grandmother; Epilepsy in her cousin, maternal aunt, and maternal uncle; Febrile seizures in her son; Hypertension in her father and paternal grandmother; Kidney disease in her father.  Denies FH of sudden death or arrhythmia  ROS:  Please see the history of present illness.   All other systems are reviewed and negative.    PHYSICAL EXAM: VS:  BP 128/70 mmHg  Pulse 81  Ht 5\' 10"  (1.778 m)  Wt 241 lb (109.317 kg)  BMI 34.58 kg/m2  SpO2 98%  LMP 03/25/2016 (Approximate) , BMI Body mass index is 34.58 kg/(m^2). GEN: Well nourished, well developed, in no acute distress HEENT: normal Neck: no JVD, carotid bruits, or masses Cardiac: RRR; no murmurs, rubs, or gallops,no edema  Respiratory:  clear to auscultation bilaterally, normal work of breathing GI: soft, nontender, nondistended, + BS MS: no deformity or atrophy Skin: warm and dry  Neuro:  Strength and sensation are intact Psych: euthymic mood, full affect  EKG:  EKG is not ordered today. 5/17 ekg is reviewed and normal   Recent Labs: 03/06/2016: ALT 13*; BUN 12; Creatinine, Ser 0.73; Magnesium 1.7; Potassium 3.7; Sodium 136; TSH 0.561 03/07/2016: Hemoglobin 8.2*; Platelets 307    Lipid Panel     Component Value Date/Time   CHOL 183 06/18/2013 1006   TRIG 75.0 06/18/2013 1006   HDL 42.60 06/18/2013 1006   CHOLHDL 4 06/18/2013 1006   VLDL 15.0 06/18/2013 1006   LDLCALC 125* 06/18/2013 1006     Wt Readings from Last 3 Encounters:  03/25/16 241 lb (109.317 kg)  03/21/16 240 lb 7 oz (109.062 kg)  03/15/16 240 lb 3.2 oz (108.954 kg)      Other studies Reviewed: Additional studies/ records that were reviewed today include: echo, Tammie [name]'s note, hospital records, event monitor    ASSESSMENT AND PLAN:  1.  Paroxysmal atrial fibriillation Only a single recent episode in the setting of seizures Echo reviewed, TSH normal Only risk factors for stroke are female sex and  htn.  Several recent seizures.  I would therefore not starting anticoagulation at this time. Prn cardizem 30mg  for tachypalpitations Follow-up with Truitt Merle as scheduled once 30 day monitor is complete  2. NSVT No evidence of structural heart disease.  No FH of arrhythmias.  No ischemic symptoms Would maintain electrolytes within therapeutic range and continue to follow conservatively  3. HTN Stable No change required today Could consider addition of beta blocker in the future given palpitations  4. Obesity Body mass index is 34.58 kg/(m^2). S/p recent gastric surgery She continues to workup on weight loss  5. Thoracic aortic enlargement Says this was told to her years ago Will order MRI check to evaluate as per echo recommendation  Follow-up with Truitt Merle as scheduled  I will see when needed  Current medicines are reviewed at length with the patient today.   The patient does not have concerns regarding her medicines.  The following changes were made today:  none    Signed, Tammie Grayer, MD  03/25/2016 11:26 AM     [facility] [address] [place] [address] [phone] (office) [phone] (fax)

## 2016-03-30 ENCOUNTER — Ambulatory Visit: Payer: BLUE CROSS/BLUE SHIELD | Admitting: Internal Medicine

## 2016-03-31 ENCOUNTER — Encounter: Payer: Self-pay | Admitting: *Deleted

## 2016-03-31 ENCOUNTER — Encounter (HOSPITAL_COMMUNITY): Payer: Self-pay | Admitting: Family Medicine

## 2016-03-31 ENCOUNTER — Emergency Department (HOSPITAL_COMMUNITY)
Admission: EM | Admit: 2016-03-31 | Discharge: 2016-03-31 | Disposition: A | Discharge status: Home / Self Care | Payer: BLUE CROSS/BLUE SHIELD | Attending: Emergency Medicine | Admitting: Emergency Medicine

## 2016-03-31 ENCOUNTER — Telehealth: Payer: Self-pay | Admitting: *Deleted

## 2016-03-31 ENCOUNTER — Telehealth: Payer: Self-pay | Admitting: Internal Medicine

## 2016-03-31 ENCOUNTER — Ambulatory Visit (HOSPITAL_COMMUNITY)
Admission: RE | Admit: 2016-03-31 | Discharge: 2016-03-31 | Disposition: A | Discharge status: Home / Self Care | Payer: BLUE CROSS/BLUE SHIELD | Source: Ambulatory Visit | Attending: Internal Medicine | Admitting: Internal Medicine

## 2016-03-31 ENCOUNTER — Other Ambulatory Visit: Payer: Self-pay

## 2016-03-31 DIAGNOSIS — J45909 Unspecified asthma, uncomplicated: Secondary | ICD-10-CM | POA: Insufficient documentation

## 2016-03-31 DIAGNOSIS — Y999 Unspecified external cause status: Secondary | ICD-10-CM | POA: Insufficient documentation

## 2016-03-31 DIAGNOSIS — I959 Hypotension, unspecified: Secondary | ICD-10-CM | POA: Diagnosis not present

## 2016-03-31 DIAGNOSIS — X58XXXA Exposure to other specified factors, initial encounter: Secondary | ICD-10-CM | POA: Diagnosis not present

## 2016-03-31 DIAGNOSIS — R569 Unspecified convulsions: Secondary | ICD-10-CM | POA: Diagnosis present

## 2016-03-31 DIAGNOSIS — Y929 Unspecified place or not applicable: Secondary | ICD-10-CM | POA: Diagnosis not present

## 2016-03-31 DIAGNOSIS — S0993XA Unspecified injury of face, initial encounter: Secondary | ICD-10-CM | POA: Insufficient documentation

## 2016-03-31 DIAGNOSIS — Y939 Activity, unspecified: Secondary | ICD-10-CM | POA: Insufficient documentation

## 2016-03-31 DIAGNOSIS — G40909 Epilepsy, unspecified, not intractable, without status epilepticus: Secondary | ICD-10-CM | POA: Insufficient documentation

## 2016-03-31 DIAGNOSIS — R931 Abnormal findings on diagnostic imaging of heart and coronary circulation: Secondary | ICD-10-CM

## 2016-03-31 LAB — I-STAT BETA HCG BLOOD, ED (MC, WL, AP ONLY): I-stat hCG, quantitative: <5 m[IU]/mL (ref <5)

## 2016-03-31 LAB — BASIC METABOLIC PANEL
Anion gap: 7 (ref 5–15)
BUN: 14 mg/dL (ref 6–20)
CHLORIDE: 105 mmol/L (ref 101–111)
CO2: 24 mmol/L (ref 22–32)
Calcium: 9.1 mg/dL (ref 8.9–10.3)
Creatinine, Ser: 0.78 mg/dL (ref 0.44–1.00)
GFR calc Af Amer: >60 mL/min (ref >60)
GFR calc non Af Amer: >60 mL/min (ref >60)
Glucose, Bld: 101 mg/dL — ABNORMAL HIGH (ref 65–99)
POTASSIUM: 3.7 mmol/L (ref 3.5–5.1)
SODIUM: 136 mmol/L (ref 135–145)

## 2016-03-31 LAB — CBC
HEMATOCRIT: 29.4 % — AB (ref 36.0–46.0)
Hemoglobin: 8.4 g/dL — ABNORMAL LOW (ref 12.0–15.0)
MCH: 20.7 pg — AB (ref 26.0–34.0)
MCHC: 28.6 g/dL — ABNORMAL LOW (ref 30.0–36.0)
MCV: 72.4 fL — AB (ref 78.0–100.0)
Platelets: 350 10*3/uL (ref 150–400)
RBC: 4.06 MIL/uL (ref 3.87–5.11)
RDW: 15.9 % — ABNORMAL HIGH (ref 11.5–15.5)
WBC: 8.8 10*3/uL (ref 4.0–10.5)

## 2016-03-31 MED ORDER — LEVETIRACETAM 750 MG PO TABS: 750.0000 mg | ORAL_TABLET | Freq: Two times a day (BID) | ORAL | Status: DC

## 2016-03-31 NOTE — Telephone Encounter (Signed)
Patient called this morning stating that she had a seizure last night.  She doesn't remember her mom or son trying to wake her up.  She has a BP monitor that she wears at night and her readings were as low as 67/30 and 67/32 with a HR of 47.  She also said that she chewed her tongue up pretty bad during her seizure.  Please advise.

## 2016-03-31 NOTE — Telephone Encounter (Signed)
Spoke with patient and she has also called Neuro.  She had another seizure last night and says her BP dropped low.  When she spoke with Neuro they asked her to call us to make [place] aware.  She has had a gastric sleeve done and lost around 40-50 pounds.  She is not currently on any medications for HTN.  She has a follow up next week with her new PCP Dr Gwenlyn Fudge of whom will be following her HTN needs.  Dr Rayann Heman is seeing back prn.  I explained to her that she may not be hydrating her body enough.  She says this is a possibility as she feels she can not drink enough secondary to sleeve.  She is going to try and hydrate more and keep her PCP follow up.  I let her know I would call her with the results of her MRA once read to assess thoracic aneurysm

## 2016-03-31 NOTE — Discharge Instructions (Signed)
Hypotension As your heart beats, it forces blood through your arteries. This force is your blood pressure. If your blood pressure is too low for you to go about your normal activities or to support the organs of your body, you have hypotension. Hypotension is also referred to as low blood pressure. When your blood pressure becomes too low, you may not get enough blood to your brain. As a result, you may feel weak, feel lightheaded, or develop a rapid heart rate. In a more severe case, you may faint. CAUSES Various conditions can cause hypotension. These include:  Blood loss.  Dehydration.  Heart or endocrine problems.  Pregnancy.  Severe infection.  Not having a well-balanced diet filled with needed nutrients.  Severe allergic reactions (anaphylaxis). Some medicines, such as blood pressure medicine or water pills (diuretics), may lower your blood pressure below normal. Sometimes taking too much medicine or taking medicine not as directed can cause hypotension. TREATMENT  Hospitalization is sometimes required for hypotension if fluid or blood replacement is needed, if time is needed for medicines to wear off, or if further monitoring is needed. Treatment might include changing your diet, changing your medicines (including medicines aimed at raising your blood pressure), and use of support stockings. HOME CARE INSTRUCTIONS   Drink enough fluids to keep your urine clear or pale yellow.  Take your medicines as directed by your health care provider.  Get up slowly from reclining or sitting positions. This gives your blood pressure a chance to adjust.  Wear support stockings as directed by your health care provider.  Maintain a healthy diet by including nutritious food, such as fruits, vegetables, nuts, whole grains, and lean meats. SEEK MEDICAL CARE IF:  You have vomiting or diarrhea.  You have a fever for more than 2-3 days.  You feel more thirsty than usual.  You feel weak and  tired. SEEK IMMEDIATE MEDICAL CARE IF:   You have chest pain or a fast or irregular heartbeat.  You have a loss of feeling in some part of your body, or you lose movement in your arms or legs.  You have trouble speaking.  You become sweaty or feel lightheaded.  You faint. MAKE SURE YOU:   Understand these instructions.  Will watch your condition.  Will get help right away if you are not doing well or get worse.   This information is not intended to replace advice given to you by your health care provider. Make sure you discuss any questions you have with your health care provider.   Document Released: 09/26/2005 Document Revised: 07/17/2013 Document Reviewed: 03/29/2013 Elsevier Interactive Patient Education [employer]. Seizure, Adult A seizure is abnormal electrical activity in the brain. Seizures usually last from 30 seconds to 2 minutes. There are various types of seizures. Before a seizure, you may have a warning sensation (aura) that a seizure is about to occur. An aura may include the following symptoms:   Fear or anxiety.  Nausea.  Feeling like the room is spinning (vertigo).  Vision changes, such as seeing flashing lights or spots. Common symptoms during a seizure include:  A change in attention or behavior (altered mental status).  Convulsions with rhythmic jerking movements.  Drooling.  Rapid eye movements.  Grunting.  Loss of bladder and bowel control.  Bitter taste in the mouth.  Tongue biting. After a seizure, you may feel confused and sleepy. You may also have an injury resulting from convulsions during the seizure. HOME CARE INSTRUCTIONS  If you are given medicines, take them exactly as prescribed by your health care provider.  Keep all follow-up appointments as directed by your health care provider.  Do not swim or drive or engage in risky activity during which a seizure could cause further injury to you or others until your health  care provider says it is OK.  Get adequate rest.  Teach friends and family what to do if you have a seizure. They should:  Lay you on the ground to prevent a fall.  Put a cushion under your head.  Loosen any tight clothing around your neck.  Turn you on your side. If vomiting occurs, this helps keep your airway clear.  Stay with you until you recover.  Know whether or not you need emergency care. SEEK IMMEDIATE MEDICAL CARE IF:  The seizure lasts longer than 5 minutes.  The seizure is severe or you do not wake up immediately after the seizure.  You have an altered mental status after the seizure.  You are having more frequent or worsening seizures. Someone should drive you to the emergency department or call local emergency services (911 in [place] MAKE SURE YOU:  Understand these instructions.  Will watch your condition.  Will get help right away if you are not doing well or get worse.   This information is not intended to replace advice given to you by your health care provider. Make sure you discuss any questions you have with your health care provider.   Document Released: 09/23/2000 Document Revised: 10/17/2014 Document Reviewed: 05/08/2013 Elsevier Interactive Patient Education [employer].

## 2016-03-31 NOTE — Telephone Encounter (Signed)
Please instruct her to increase Keppra to 750mg  twice daily.  I am not sure why her blood pressure is staying so low and may want to discuss this with her PCP/cardiologist.  In the meantime, encourage her to drink plenty of water.

## 2016-03-31 NOTE — Telephone Encounter (Signed)
New message   Pt c/o BP issue: STAT if pt c/o blurred vision, one-sided weakness or slurred speech  1. What are your last 5 BP readings? 63/38 HR 47 2. Are you having any other symptoms (ex. Dizziness, headache, blurred vision, passed out)? Pt did pass out she has a seizure  3. What is your BP issue? Low  She also needs to talk about her Her short term disability

## 2016-03-31 NOTE — Addendum Note (Signed)
Addended by: Alda Berthold on: 03/31/2016 08:09 AM   Modules accepted: Orders

## 2016-03-31 NOTE — ED Provider Notes (Signed)
CSN: [id number]     Arrival date & time 03/31/16  1630 History   First MD Initiated Contact with Patient 03/31/16 1737     Chief Complaint  Patient presents with  . Seizures  . Hypotension     (Consider location/radiation/quality/duration/timing/severity/associated sxs/prior Treatment) HPI Comments: Patient presents to the emergency department with chief complaint of seizures and reported hypotension. Patient was scheduled to have an MRA today, and when she went to her imaging appointment, she mentioned that she had had several bouts of low blood pressure at night. MRI is in the patient to the emergency department for evaluation. Patient states that she had a seizure last night. It was unwitnessed, but her mother heard her "grunting and the other room." The mother states that the patient was postictal for approximately 45 minutes. She is seen by neurology, and recently had her Keppra increased from 500 mg twice a day to 750 mg twice a day. She reports having some muscle cramps in her extremities and feels sore.  She also complains of mild tongue pain, which she bit during her seizure last night.  The history is provided by the patient. No language interpreter was used.    Past Medical History  Diagnosis Date  . Hypertension   . Fibroid   . Anemia   . Abnormal Pap smear   . Hypopotassemia   . Esophageal reflux   . Palpitations   . Pregnancy induced hypertension   . Asthma     as child  . Obesity     s/p gastric sleeve  . Paroxysmal atrial fibrillation (HCC)   . Premature ventricular contraction   . Seizure ([place])   . Aortic root enlargement ([place]) 5/17    5.1cm thoracic aorta by echo   Past Surgical History  Procedure Laterality Date  . Tubal ligation    . Colposcopy    . Oophorectomy      Left  . Laparoscopic gastric sleeve resection  9/16    [facility]   Family History  Problem Relation Age of Onset  . Hypertension Father   . Kidney disease Father   .  Cancer Father   . Diabetes Father   . Diabetes Maternal Grandmother   . Hypertension Paternal Grandmother   . Epilepsy Cousin   . Epilepsy Maternal Aunt   . Epilepsy Maternal Uncle   . Febrile seizures Son   . Asthma Son    Social History  Substance Use Topics  . Smoking status: Never Smoker   . Smokeless tobacco: Never Used  . Alcohol Use: 0.0 oz/week    0 Standard drinks or equivalent per week     Comment: drank last night; does not usually drink   OB History    Gravida Para Term Preterm AB TAB SAB Ectopic Multiple Living   2 2 2       2      Review of Systems  Neurological: Positive for seizures.  All other systems reviewed and are negative.     Allergies  Sulfa antibiotics and Sulfamethoxazole  Home Medications   Prior to Admission medications   Medication Sig Start Date End Date Taking? Authorizing Provider  acetaminophen (TYLENOL) 500 MG tablet Take 1,000 mg by mouth every 6 (six) hours as needed (pain).    Historical Provider, MD  albuterol (PROVENTIL HFA;VENTOLIN HFA) 108 (90 BASE) MCG/ACT inhaler Inhale 1-2 puffs into the lungs every 6 (six) hours as needed for wheezing or shortness of breath.    Historical  Provider, MD  Calcium Citrate-Vitamin D (CALCIUM + D PO) Take 3 tablets by mouth daily.    Historical Provider, MD  Cyanocobalamin (VITAMIN B-12 PO) Take 1 tablet by mouth daily.    Historical Provider, MD  diltiazem (CARDIZEM) 30 MG tablet Take 1 tablet (30 mg total) by mouth 4 (four) times daily. 03/25/16   Thompson Grayer, MD  levETIRAcetam (KEPPRA) 750 MG tablet Take 1 tablet (750 mg total) by mouth 2 (two) times daily. 03/31/16   Donika Keith Rake, DO  lubiprostone (AMITIZA) 24 MCG capsule Take 1 capsule (24 mcg total) by mouth 2 (two) times daily with a meal. 03/08/16   [name], MD  Multiple Vitamin (MULTIVITAMIN WITH MINERALS) TABS tablet Take 1 tablet by mouth daily.    Historical Provider, MD   BP 121/81 mmHg  Pulse 80  Temp(Src) 98.8 F (37.1 C)  (Oral)  Resp 18  SpO2 100%  LMP 03/25/2016 (Approximate) Physical Exam  Constitutional: She is oriented to person, place, and time. She appears well-developed and well-nourished.  HENT:  Head: Normocephalic and atraumatic.  Mild right tongue injury, no laceration  Eyes: Conjunctivae and EOM are normal. Pupils are equal, round, and reactive to light.  Neck: Normal range of motion. Neck supple.  Cardiovascular: Normal rate and regular rhythm.  Exam reveals no gallop and no friction rub.   No murmur heard. Pulmonary/Chest: Effort normal and breath sounds normal. No respiratory distress. She has no wheezes. She has no rales. She exhibits no tenderness.  Abdominal: Soft. Bowel sounds are normal. She exhibits no distension and no mass. There is no tenderness. There is no rebound and no guarding.  Musculoskeletal: Normal range of motion. She exhibits no edema or tenderness.  Neurological: She is alert and oriented to person, place, and time.  CN 3-12 intact Speech is clear Movements are goal oriented Sensation and strength intact  Skin: Skin is warm and dry.  Psychiatric: She has a normal mood and affect. Her behavior is normal. Judgment and thought content normal.  Nursing note and vitals reviewed.   ED Course  Procedures (including critical care time) Results for orders placed or performed during the hospital encounter of [id number]  Basic metabolic panel - if new onset seizures  Result Value Ref Range   Sodium 136 135 - 145 mmol/L   Potassium 3.7 3.5 - 5.1 mmol/L   Chloride 105 101 - 111 mmol/L   CO2 24 22 - 32 mmol/L   Glucose, Bld 101 (H) 65 - 99 mg/dL   BUN 14 6 - 20 mg/dL   Creatinine, Ser 0.78 0.44 - 1.00 mg/dL   Calcium 9.1 8.9 - 10.3 mg/dL   GFR calc non Af Amer >60 >60 mL/min   GFR calc Af Amer >60 >60 mL/min   Anion gap 7 5 - 15  CBC - if new onset seizures  Result Value Ref Range   WBC 8.8 4.0 - 10.5 K/uL   RBC 4.06 3.87 - 5.11 MIL/uL   Hemoglobin 8.4 (L) 12.0 - 15.0  g/dL   HCT 29.4 (L) 36.0 - 46.0 %   MCV 72.4 (L) 78.0 - 100.0 fL   MCH 20.7 (L) 26.0 - 34.0 pg   MCHC 28.6 (L) 30.0 - 36.0 g/dL   RDW 15.9 (H) 11.5 - 15.5 %   Platelets 350 150 - 400 K/uL  I-Stat beta hCG blood, ED  Result Value Ref Range   I-stat hCG, quantitative <5.0 <5 mIU/mL   Comment 3  Dg Chest 2 View  03/06/2016  CLINICAL DATA:  Patient with diffuse body aches and weakness. Possible seizure. EXAM: CHEST  2 VIEW COMPARISON:  Chest radiograph 07/05/2015. FINDINGS: Stable enlarged cardiac and mediastinal contours. No consolidative pulmonary opacities. No pleural effusion or pneumothorax. Thoracic spine degenerative changes. IMPRESSION: No active cardiopulmonary disease. Electronically Signed   By: Lovey Newcomer M.D.   On: 03/06/2016 10:13   [name] Contrast  03/06/2016  CLINICAL DATA:  40 year old hypertensive female with seizure. Subsequent encounter. EXAM: MRI HEAD WITHOUT CONTRAST TECHNIQUE: Multiplanar, multiecho pulse sequences of the brain and surrounding structures were obtained without intravenous contrast. COMPARISON:  01/16/2016 head CT.  No comparison brain MR. FINDINGS: Exam is motion degraded. No acute infarct or intracranial hemorrhage. No evidence of mesial temporal sclerosis. No intracranial mass lesion noted on this unenhanced exam. Partially empty slightly expanded sella without secondary findings of pseudotumor. Major intracranial vascular structures are patent. No obvious orbital abnormality. IMPRESSION: Motion degraded unenhanced examination without seizure focus identified. Please see above. Electronically Signed   By: Genia Del M.D.   On: 03/06/2016 13:02    I have personally reviewed and evaluated these images and lab results as part of my medical decision-making.   ED ECG REPORT  I personally interpreted this EKG   Date: 03/31/2016   Rate: 81  Rhythm: normal sinus rhythm  QRS Axis: normal  Intervals: borderline prolonged  ST/T Wave  abnormalities: normal  Conduction Disutrbances:none  Narrative Interpretation:   Old EKG Reviewed: none available   MDM   Final diagnoses:  Seizure ([place])  Hypotension, unspecified    Patient was scheduled to have that outpatient MRI today. She was sent to the emergency department because she reported that she has had hypotension during the night. He is not hypotensive now. She is alert and oriented. She also states that she had a seizure last night. She is in the middle of a workup by neurology and cardiology for tachycardia palpitations and seizures.  Her vital signs are stable now. She is well-appearing. She is not in any apparent distress.  I have spoken with MRI, and they asked that the patient contact them in the morning to be rescheduled.  Patient discussed with Dr. Venora Maples, who agrees that no additional workup is indicated now. Patient is stable and ready for discharge.    Montine Circle, PA-C 03/31/16 [place], MD 04/01/16 [phone]

## 2016-03-31 NOTE — ED Notes (Signed)
Pt here for seizure like activity this am and woke up not feeling well. sts her BP has been dropping in the night into the 60s. Sts was scheduled for MRI today and unable because not feeling well. Pt recent dx of seizures and on meds. Pt has bites to tongue.

## 2016-04-04 ENCOUNTER — Telehealth: Payer: Self-pay | Admitting: Neurology

## 2016-04-04 ENCOUNTER — Institutional Professional Consult (permissible substitution): Payer: BLUE CROSS/BLUE SHIELD | Admitting: Internal Medicine

## 2016-04-04 NOTE — Telephone Encounter (Signed)
Tammie [name] [birth year] she would like you to please call her at [phone]. She was unable to have heart procedure yesterday due to them thinking she had a stroke, she explained it was a seizure. She is having it done tomorrow. Thank you

## 2016-04-04 NOTE — Telephone Encounter (Signed)
Pt calling again about note [phone]

## 2016-04-05 ENCOUNTER — Ambulatory Visit (HOSPITAL_COMMUNITY): Admission: RE | Admit: 2016-04-05 | Payer: BLUE CROSS/BLUE SHIELD | Source: Ambulatory Visit

## 2016-04-05 ENCOUNTER — Ambulatory Visit: Payer: BLUE CROSS/BLUE SHIELD | Admitting: Internal Medicine

## 2016-04-05 NOTE — Telephone Encounter (Signed)
Spoke with patient and she said that she is having the cardiac procedure done on Friday since the machine was broken.  Informed her that Dr. Posey Pronto has her out of work until July 21 according to her FMLA papers that were faxed in yesterday.

## 2016-04-07 ENCOUNTER — Ambulatory Visit (HOSPITAL_COMMUNITY): Payer: BLUE CROSS/BLUE SHIELD

## 2016-04-13 ENCOUNTER — Telehealth: Payer: Self-pay | Admitting: Neurology

## 2016-04-13 ENCOUNTER — Ambulatory Visit (HOSPITAL_COMMUNITY)
Admission: RE | Admit: 2016-04-13 | Discharge: 2016-04-13 | Disposition: A | Discharge status: Home / Self Care | Payer: BLUE CROSS/BLUE SHIELD | Source: Ambulatory Visit | Attending: Internal Medicine | Admitting: Internal Medicine

## 2016-04-13 ENCOUNTER — Other Ambulatory Visit: Payer: Self-pay | Admitting: Internal Medicine

## 2016-04-13 DIAGNOSIS — I712 Thoracic aortic aneurysm, without rupture: Secondary | ICD-10-CM | POA: Insufficient documentation

## 2016-04-13 DIAGNOSIS — R931 Abnormal findings on diagnostic imaging of heart and coronary circulation: Secondary | ICD-10-CM

## 2016-04-13 MED ORDER — GADOBENATE DIMEGLUMINE 529 MG/ML IV SOLN
20.0000 mL | Freq: Once | INTRAVENOUS | Status: AC | PRN
  Administered 2016-04-13: 20 mL via INTRAVENOUS

## 2016-04-13 NOTE — Telephone Encounter (Signed)
PT called in regards to some disability papers that needed to be filled out/Dawn CB# [phone]

## 2016-04-14 ENCOUNTER — Telehealth: Payer: Self-pay | Admitting: *Deleted

## 2016-04-14 DIAGNOSIS — I712 Thoracic aortic aneurysm, without rupture, unspecified: Secondary | ICD-10-CM

## 2016-04-14 NOTE — Telephone Encounter (Signed)
Spoke with pt, aware of results. referral placed for dr gerhardt and sent to [facility] to follow up

## 2016-04-14 NOTE — Telephone Encounter (Signed)
-----   Message from Thompson Grayer, MD sent at 04/13/2016  3:25 PM EDT ----- Results reviewed.  Claiborne Billings, please inform pt of result.  Please refer to Dr Servando Snare. I will route to primary care also.

## 2016-04-14 NOTE — Telephone Encounter (Signed)
Left message for pt to call.

## 2016-04-14 NOTE — Telephone Encounter (Signed)
Patient informed that I am waiting for papers to be signed and I will then fax them.

## 2016-04-18 ENCOUNTER — Telehealth: Payer: Self-pay | Admitting: Neurology

## 2016-04-18 NOTE — Telephone Encounter (Signed)
PT called in regards to her disability papers and needs a call back/Dawn CB# [phone]

## 2016-04-19 NOTE — Telephone Encounter (Signed)
I spoke with patient and informed her that I received the request for the notes and will fax them over this morning.

## 2016-04-20 ENCOUNTER — Ambulatory Visit: Payer: BLUE CROSS/BLUE SHIELD | Admitting: Nurse Practitioner

## 2016-04-20 DIAGNOSIS — R0989 Other specified symptoms and signs involving the circulatory and respiratory systems: Secondary | ICD-10-CM

## 2016-04-21 ENCOUNTER — Institutional Professional Consult (permissible substitution) (INDEPENDENT_AMBULATORY_CARE_PROVIDER_SITE_OTHER): Payer: BLUE CROSS/BLUE SHIELD | Admitting: Cardiothoracic Surgery

## 2016-04-21 VITALS — BP 140/84 | HR 89 | Resp 20 | Ht 70.0 in | Wt 240.0 lb

## 2016-04-21 DIAGNOSIS — I712 Thoracic aortic aneurysm, without rupture: Secondary | ICD-10-CM | POA: Diagnosis not present

## 2016-04-21 DIAGNOSIS — I7121 Aneurysm of the ascending aorta, without rupture: Secondary | ICD-10-CM

## 2016-04-21 NOTE — Patient Instructions (Addendum)
It's best to avoid activities that cause grunting or straining (medically referred to as a "valsalva maneuver"). This happens when a person bears down against a closed throat to increase the strength of arm or abdominal muscles. There's often a tendency to do this when lifting heavy weights, doing sit-ups, push-ups or chin-ups, etc., but it may be harmful.   Avoid Pregnancy, as pregnancy increases risk of dissection  Continue close follow up with cardiology to monitor BP and meds    Thoracic Aortic Aneurysm An aneurysm is a bulge in an artery. It happens when the wall of the artery is weakened or damaged. If the aneurysm gets too big, it bursts (ruptures) and severe bleeding occurs. A thoracic aortic aneurysm is an aneurysm that occurs in the first part of the aorta, between the heart and the diaphragm. The aorta is the main artery and supplies blood from the heart to the rest of the body. A thoracic aortic aneurysm can enlarge and rupture or blood can flow between the layers of the wall of the aorta through a tear (aorticdissection). Both of these conditions can cause bleeding inside the body and can be life threatening unless diagnosed and treated promptly. CAUSES  The exact cause of a thoracic aortic aneurysm is often unknown. Some contributing factors are:   A hardening of the arteries caused by the buildup of fat and other substances in the lining of a blood vessel (arteriosclerosis).  Inflammation of the walls of an artery (arteritis).  Connective tissue diseases, such as Marfan syndrome.  Injury or trauma to the aorta.  An infection, such as syphilis or staphylococcus, in the wall of the aorta (infectious aortitis) caused by bacteria. RISK FACTORS  Risk factors that contribute to a thoracic aortic aneurysm may include:  Age older than 12 years.  High blood pressure (hypertension).  Female gender.  Ethnicity (white race).  Obesity.  Family history of aneurysm (first degree  relatives only).  Pregnancy  Tobacco use. PREVENTION  The following healthy lifestyle habits may help decrease your risk of a thoracic aortic aneurysm:  Quitting smoking. Smoking can raise your blood pressure and cause arteriosclerosis.  Limiting or avoiding alcohol.  Keeping your blood pressure, blood sugar level, and cholesterol levels within normal limits.  Decreasing your salt intake. In some people, too much salt can raise blood pressure and increase your risk of abdominal aortic aneurysm.  Eating a diet low in saturated fats and cholesterol.  Increasing your fiber intake by including whole grains, vegetables, and fruits in your diet. Eating these foods may help lower blood pressure.  Maintaining a healthy weight.  Staying physically active and exercising regularly. SYMPTOMS  The symptoms of thoracic aortic aneurysm may vary depending on the size and rate of growth of the aneurysm. Most grow slowly and do not have any symptoms. When symptoms do occur, they may include:  Pain (chest, back, sides, or abdomen). The pain may vary in intensity. A sudden onset of severe pain may indicate that the aneurysm has ruptured.  Hoarseness.  Cough.  Shortness of breath.  Swallowing problems.  Nausea or vomiting or both. DIAGNOSIS  Since most unruptured thoracic aortic aneurysms have no symptoms, they are often discovered during diagnostic exams for other conditions. An aneurysm may be found during the following procedures:  Ultrasonography (a one-time screening for thoracic aortic aneurysm by ultrasonography is also recommended for all men aged 59-75 years who have ever smoked).  X-ray exams.  A CT scan.  An MRI.  Angiography  or arteriography. TREATMENT  Treatment of a thoracic aortic aneurysm depends on the size of your aneurysm, your age, and risk factors for rupture. Medicine to control blood pressure and pain may be used to manage aneurysms smaller than 2.3 in (6 cm).  Regular monitoring for enlargement may be recommended by your health care provider if:  The aneurysm is 1.2-1.5 in (3-4 cm) in size (an annual ultrasonography may be recommended).  The aneurysm is 1.5-1.8 in (4-4.5 cm) in size (an ultrasonography every 6 months may be recommended).  The aneurysm is larger than 1.8 in (4.5 cm) in size (your health care provider may ask that you be examined by a vascular surgeon). If your aneurysm is larger than 2.2 in (5.5 cm) or if it is enlarging quickly, surgical repair may be recommended. There are two main methods for repair of an aneurysm:   Endovascular repair (a minimally invasive surgery).  Open repair. This method is used if an endovascular repair is not possible.   This information is not intended to replace advice given to you by your health care provider. Make sure you discuss any questions you have with your health care provider.   Document Released: 09/26/2005 Document Revised: 07/17/2013 Document Reviewed: 04/08/2013 Elsevier Interactive Patient Education [employer].

## 2016-04-21 NOTE — Progress Notes (Signed)
[address]       [place] [address]             [phone]                    Tammie [name] Medical Record [id number] Date of Birth: Aug 25, 1976  Referring: Thompson Grayer, MD Primary Care: Pcp Not In System  Chief Complaint:    Chief Complaint  Patient presents with  . Thoracic Aortic Aneurysm    Surgical eval, MRA Chest 04/13/16, ECHO 03/07/16    History of Present Illness:    Tammie [name] 40 y.o. female is seen in the office  today for Incidental finding of dilated ascending aorta without associated bicuspid valve on echocardiogram. The patient notes that she was told she had an enlarged heart in her 37s. There is no documentation of the size of her aorta with any previous scans. Because of new onset seizures in April of this year and episodes of "fast heart rate" an echocardiogram was done which suggested a dilated ascending aorta subsequently Dr. Rayann Heman ordered a MRI of the chest.   Patient is also noted to be anemic and has been referred for workup of this    Current Activity/ Functional Status:  Patient is independent with mobility/ambulation, transfers, ADL's, IADL's.   Zubrod Score: At the time of surgery this patient's most appropriate activity status/level should be described as: []     0    Normal activity, no symptoms [x]     1    Restricted in physical strenuous activity but ambulatory, able to do out light work []     2    Ambulatory and capable of self care, unable to do work activities, up and about               >50 % of waking hours                              []     3    Only limited self care, in bed greater than 50% of waking hours []     4    Completely disabled, no self care, confined to bed or chair []     5    Moribund   Past Medical History  Diagnosis Date  . Hypertension   . Fibroid   . Anemia   . Abnormal Pap smear   . Hypopotassemia   . Esophageal reflux   . Palpitations   . Pregnancy induced  hypertension   . Asthma     as child  . Obesity     s/p gastric sleeve  . Paroxysmal atrial fibrillation (HCC)   . Premature ventricular contraction   . Seizure ([place])   . Aortic root enlargement ([place]) 5/17    5.1cm thoracic aorta by echo    Past Surgical History  Procedure Laterality Date  . Tubal ligation    . Colposcopy    . Oophorectomy      Left  . Laparoscopic gastric sleeve resection  9/16    [facility]    Family History  Problem Relation Age of Onset  . Hypertension Father   . Kidney disease Father   . Cancer Father   . Diabetes Father   . Diabetes Maternal Grandmother   . Hypertension Paternal Grandmother   . Epilepsy Cousin   . Epilepsy Maternal Aunt   .  Epilepsy Maternal Uncle   . Febrile seizures Son   . Asthma Son   Patient's mother has no history of heart disease father is deceased at age 76 of esophageal cancer one grandmother had a myocardial infarction at age 46 , t paternal grandmother died of a stroke one of her father's cousins died so suddenly and Coumadin H 16 unknown cause  Social History   Social History  . Marital Status: Single    Spouse Name: N/A  . Number of Children: N/A  . Years of Education: N/A   Occupational History  . 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 Smoking status: Never Smoker   . Smokeless tobacco: Never Used  . Alcohol Use: 0.0 oz/week    0 Standard drinks or equivalent per week     Comment: drank last night; does not usually drink  . Drug Use: No  . Sexual Activity: Yes    Birth Control/ Protection: Condom, Surgical   Other Topics Concern  . Not on file   Social History Narrative   Lives with children in an apartment on the second floor.  Has 2 sons.  Works as Chief Strategy Officer.  Education: 2 1/2 years of college.      History  Smoking status  . Never Smoker   Smokeless tobacco  . Never Used    History  Alcohol Use  . 0.0  oz/week  . 0 Standard drinks or equivalent per week    Comment: drank last night; does not usually drink     Allergies  Allergen Reactions  . Sulfa Antibiotics Anaphylaxis, Rash and Other (See Comments)    "Burning"  . Sulfamethoxazole Anaphylaxis, Rash and Other (See Comments)    burning  . Tape Rash    Leads on heart monitor have caused a rash    Current Outpatient Prescriptions  Medication Sig Dispense Refill  . acetaminophen (TYLENOL) 500 MG tablet Take 1,000 mg by mouth every 6 (six) hours as needed (pain).    Marland Kitchen albuterol (PROVENTIL HFA;VENTOLIN HFA) 108 (90 BASE) MCG/ACT inhaler Inhale 1-2 puffs into the lungs every 6 (six) hours as needed for wheezing or shortness of breath.    . Calcium Citrate-Vitamin D (CALCIUM + D PO) Take 3 tablets by mouth daily.    . Cyanocobalamin (VITAMIN B-12 PO) Take 1 tablet by mouth daily.    Marland Kitchen diltiazem (CARDIZEM) 30 MG tablet Take 1 tablet (30 mg total) by mouth 4 (four) times daily. (Patient taking differently: Take 30 mg by mouth 4 (four) times daily as needed. ) 30 tablet 1  . ibuprofen (ADVIL,MOTRIN) 200 MG tablet Take 400 mg by mouth every 6 (six) hours as needed for headache, mild pain or moderate pain.    Marland Kitchen levETIRAcetam (KEPPRA) 750 MG tablet Take 1 tablet (750 mg total) by mouth 2 (two) times daily. 60 tablet 5  . Multiple Vitamin (MULTIVITAMIN WITH MINERALS) TABS tablet Take 1 tablet by mouth daily.    . polyethylene glycol (MIRALAX / GLYCOLAX) packet Mix 1 packet in 4-8 ounces of liquid and drink once daily as needed  0  . lubiprostone (AMITIZA) 24 MCG capsule Take 1 capsule (24 mcg total) by mouth 2 (two) times daily with a meal. (Patient not taking: Reported on 04/21/2016) 20 capsule 0   No current facility-administered medications for this visit.      Review of Systems:     Cardiac Review of  Systems: Y or N  Chest Pain [n    ]  Resting SOB [  n ] Exertional SOB  [ n ]  Orthopnea [ n ]   Pedal Edema [ n  ]    Palpitations Blue.Reese   ] Syncope  [ y ]   Presyncope [  n ]  General Review of Systems: [Y] = yes [  ]=no Constitional: recent weight change [  ];  Wt loss over the last 3 months [   ] anorexia [  ]; fatigue [  ]; nausea [  ]; night sweats [  ]; fever [  ]; or chills [  ];          Dental: poor dentition[  ]; Last Dentist visit:   Eye : blurred vision [  ]; diplopia [   ]; vision changes [  ];  Amaurosis fugax[  ]; Resp: cough [  ];  wheezing[  ];  hemoptysis[  ]; shortness of breath[  ]; paroxysmal nocturnal dyspnea[  ]; dyspnea on exertion[  ]; or orthopnea[  ];  GI:  gallstones[  ], vomiting[  ];  dysphagia[  ]; melena[  ];  hematochezia [  ]; heartburn[  ];   Hx of  Colonoscopy[  ]; GU: kidney stones [  ]; hematuria[  ];   dysuria [  ];  nocturia[  ];  history of     obstruction [  ]; urinary frequency [  ]             Skin: rash, swelling[  ];, hair loss[  ];  peripheral edema[  ];  or itching[  ]; Musculosketetal: myalgias[  ];  joint swelling[  ];  joint erythema[  ];  joint pain[  ];  back pain[  ];  Heme/Lymph: bruising[  ];  bleeding[  ];  anemia[  ];  Neuro: TIA[  ];  headaches[  ];  stroke[  ];  vertigo[  ];  seizures[ y ];   paresthesias[  ];  difficulty walking[  ];  Psych:depression[  ]; anxiety[  ];  Endocrine: diabetes[  ];  thyroid dysfunction[  ];  Immunizations: Flu up to date Blue.Reese  ]; Pneumococcal up to date Blue.Reese ];  Other:  Physical Exam: BP 140/84 mmHg  Pulse 89  Resp 20  Ht 5\' 10"  (1.778 m)  Wt 240 lb (108.863 kg)  BMI 34.44 kg/m2  SpO2 98%  LMP 03/25/2016 (Approximate)  PHYSICAL EXAMINATION: General appearance: alert, cooperative and morbidly obese Head: Normocephalic, without obvious abnormality, atraumatic Neck: no adenopathy, no carotid bruit, no JVD, supple, symmetrical, trachea midline and thyroid not enlarged, symmetric, no tenderness/mass/nodules Lymph nodes: Cervical, supraclavicular, and axillary nodes normal. Resp: clear to auscultation bilaterally Back: symmetric, no  curvature. ROM normal. No CVA tenderness. Cardio: regular rate and rhythm, S1, S2 normal, no murmur, click, rub or gallop GI: soft, non-tender; bowel sounds normal; no masses,  no organomegaly Extremities: extremities normal, atraumatic, no cyanosis or edema and Homans sign is negative, no sign of DVT Neurologic: Grossly normal  Diagnostic Studies & Laboratory data:     Recent Radiology Findings:   Mr Jodene Nam Chest W Wo Contrast  04/13/2016  CLINICAL DATA:  weight loss after gastric sx. Episodes of hypotension. With seizure activity. eval for possible aorta dilatation. Pt complains of abd pain and states she has a known enlarged heart. Pt had difficult time with breathold sequences. EXAM: MRA CHEST WITH OR WITHOUT CONTRAST TECHNIQUE: Angiographic images of the  chest were obtained using MRA technique without and with intravenous contrast. CONTRAST:  59mL MULTIHANCE GADOBENATE DIMEGLUMINE 529 MG/ML IV SOLN COMPARISON:  None. FINDINGS: SVC patent. Right atrium and right ventricle are nondilated. Central pulmonary arteries normal in caliber; exam is not optimized for detection of pulmonary emboli. Pulmonary veins drain into the left atrium. Ascending thoracic aortic aneurysm with maximum transverse dimensions as follows: 3.2 cm sino-tubular junction 4.8 cm proximal ascending 4 cm distal ascending/ proximal arch 3.2 cm distal arch 2.8 cm proximal descending 3.1 cm mid descending 2.9 distal descending at the diaphragm No dissection or stenosis. Classic 3 vessel brachiocephalic arterial origin anatomy without proximal stenosis. Atheromatous irregularity in the descending segment without mural thrombus. The visualized proximal abdominal aorta is normal in caliber, unremarkable. No pericardial effusion. No mediastinal hematoma. Trace pleural effusions. No mediastinal, hilar, or axillary adenopathy. Limited evaluation of visualized portions of upper abdomen unremarkable. Regional bones unremarkable. IMPRESSION: 1. 4.8 cm  ascending aortic aneurysm without complicating features. Recommend semi-annual imaging followup by CTA or MRA and referral to cardiothoracic surgery if not already obtained. This recommendation follows 2010 ACCF/AHA/AATS/ACR/ASA/SCA/SCAI/SIR/STS/SVM Guidelines for the Diagnosis and Management of Patients With Thoracic Aortic Disease. Circulation. 2010; [id number] Electronically Signed   By: Lucrezia Europe M.D.   On: 04/13/2016 10:02  I have independently reviewed the above radiology studies  and reviewed the findings with the patient.  ECHO 02/2016 Study Conclusions  - Left ventricle: The cavity size was normal. Wall thickness was  normal. Systolic function was normal. The estimated ejection  fraction was in the range of 50% to 55%. Wall motion was normal;  there were no regional wall motion abnormalities. Features are  consistent with a pseudonormal left ventricular filling pattern,  with concomitant abnormal relaxation and increased filling  pressure (grade 2 diastolic dysfunction). - Left atrium: The atrium was mildly dilated.  Impressions:  - Low normal LV systolic function; grade 2 diastolic dysfunction;  mild LAE; mild TR; severely dilated ascending aorta (51 mm);  suggest CTA or MRA to further assess.  Transthoracic echocardiography. M-mode, complete 2D, spectral Doppler, and color Doppler. Birthdate: Patient birthdate: 1976/03/22. Age: Patient is 40 yr old. Sex: Gender: female. BMI: 34.3 kg/m^2. Blood pressure: 133/72 Patient status: Inpatient. Study date: Study date: 03/07/2016. Study time: 04:35 PM. Location: Bedside.  -------------------------------------------------------------------  ------------------------------------------------------------------- Left ventricle: The cavity size was normal. Wall thickness was normal. Systolic function was normal. The estimated ejection fraction was in the range of 50% to 55%. Wall motion was  normal; there were no regional wall motion abnormalities. Features are consistent with a pseudonormal left ventricular filling pattern, with concomitant abnormal relaxation and increased filling pressure (grade 2 diastolic dysfunction).  ------------------------------------------------------------------- Aortic valve: Trileaflet; normal thickness leaflets. Mobility was not restricted. Doppler: Transvalvular velocity was within the normal range. There was no stenosis. There was no regurgitation.  ------------------------------------------------------------------- Aorta: Aortic root: The aortic root was normal in size.  ------------------------------------------------------------------- Mitral valve: Structurally normal valve. Mobility was not restricted. Doppler: Transvalvular velocity was within the normal range. There was no evidence for stenosis. There was no regurgitation. Peak gradient (D): 3 mm Hg.  ------------------------------------------------------------------- Left atrium: The atrium was mildly dilated.  ------------------------------------------------------------------- Right ventricle: The cavity size was normal. Systolic function was normal.  ------------------------------------------------------------------- Pulmonic valve: Doppler: Transvalvular velocity was within the normal range. There was no evidence for stenosis.  ------------------------------------------------------------------- Tricuspid valve: Structurally normal valve. Doppler: Transvalvular velocity was within the normal range. There was mild regurgitation.  ------------------------------------------------------------------- Right atrium: The atrium  was normal in size.  ------------------------------------------------------------------- Pericardium: There was no pericardial effusion.  ------------------------------------------------------------------- Systemic  veins: Inferior vena cava: The vessel was normal in size.  Recent Lab Findings: Lab Results  Component Value Date   WBC 8.8 03/31/2016   HGB 8.4* 03/31/2016   HCT 29.4* 03/31/2016   PLT 350 03/31/2016   GLUCOSE 101* 03/31/2016   CHOL 183 06/18/2013   TRIG 75.0 06/18/2013   HDL 42.60 06/18/2013   LDLCALC 125* 06/18/2013   ALT 13* 03/06/2016   AST 18 03/06/2016   NA 136 03/31/2016   K 3.7 03/31/2016   CL 105 03/31/2016   CREATININE 0.78 03/31/2016   BUN 14 03/31/2016   CO2 24 03/31/2016   TSH 0.561 03/06/2016   HGBA1C 4.7 11/16/2011   Aortic Size Index=   4.8       /Body surface area is 2.32 meters squared. 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 Rayann Heman Obesity- status post sleeve gastroplasty  I reviewed carefully with the patient and her mother the radiographic findings of dilated ascending aorta, I recommended follow-up CTA scan in 6 months. The patient has specifically been warned to avoid strenuous lifting over 25 pounds, avoid pregnancy, avoid hypertension, avoid any legal or illegal drugs that may cause hypertension. Signs and symptoms of aortic dissection have been reviewed with her carefully.  According to the 2010 ACC/AHA guidelines, we recommend patients with thoracic aortic disease to maintain a LDL of less than 70 and a HDL of greater than 50. We recommend their blood pressure to remain less than 135/85. T   I  spent 40 minutes counseling the patient face to face and 50% or more the  time was spent in counseling and coordination of care. The total  time spent in the appointment was 60 minutes.  Grace Isaac MD      [address] [place] [address] Office [phone]   Beeper [phone]  04/21/2016 9:40 AM

## 2016-04-26 ENCOUNTER — Telehealth: Payer: Self-pay | Admitting: Neurology

## 2016-04-26 NOTE — Telephone Encounter (Signed)
Tammie [name] 09-05-1976. She would like you to please call her back at [phone]. She said she had another  seizure last week. She had received a call from her job. She may need another note. Thank you.

## 2016-04-27 ENCOUNTER — Encounter: Payer: Self-pay | Admitting: *Deleted

## 2016-04-27 NOTE — Telephone Encounter (Signed)
Unclear why this was sent to me. Routing back to sender for more information

## 2016-04-27 NOTE — Telephone Encounter (Signed)
Noted.  Was this also out of sleep?  Continue keppra 750mg  twice daily.

## 2016-04-27 NOTE — Telephone Encounter (Signed)
I asked patient's son when he came to pick up letter and he said that she was awake when the last seizure occurred.

## 2016-04-27 NOTE — Telephone Encounter (Signed)
I spoke with patient and she had another seizure on 04-22-16.  She was trying to finish up the 500 mg Keppra before starting the 750 mg.  She is now on the 750 mg.  She is requesting another note for work.  Instructed her to pick up after lunch.

## 2016-04-28 NOTE — Telephone Encounter (Signed)
Noted  

## 2016-05-07 ENCOUNTER — Encounter (HOSPITAL_COMMUNITY): Payer: Self-pay | Admitting: *Deleted

## 2016-05-07 ENCOUNTER — Emergency Department (HOSPITAL_COMMUNITY): Payer: BLUE CROSS/BLUE SHIELD

## 2016-05-07 ENCOUNTER — Emergency Department (HOSPITAL_COMMUNITY)
Admission: EM | Admit: 2016-05-07 | Discharge: 2016-05-07 | Disposition: A | Discharge status: Home / Self Care | Payer: BLUE CROSS/BLUE SHIELD | Attending: Emergency Medicine | Admitting: Emergency Medicine

## 2016-05-07 DIAGNOSIS — R072 Precordial pain: Secondary | ICD-10-CM | POA: Diagnosis not present

## 2016-05-07 DIAGNOSIS — J45909 Unspecified asthma, uncomplicated: Secondary | ICD-10-CM | POA: Diagnosis not present

## 2016-05-07 DIAGNOSIS — I10 Essential (primary) hypertension: Secondary | ICD-10-CM | POA: Diagnosis not present

## 2016-05-07 DIAGNOSIS — R079 Chest pain, unspecified: Secondary | ICD-10-CM

## 2016-05-07 DIAGNOSIS — K029 Dental caries, unspecified: Secondary | ICD-10-CM | POA: Insufficient documentation

## 2016-05-07 DIAGNOSIS — K0889 Other specified disorders of teeth and supporting structures: Secondary | ICD-10-CM

## 2016-05-07 LAB — CBC
HEMATOCRIT: 36.7 % (ref 36.0–46.0)
HEMOGLOBIN: 11 g/dL — AB (ref 12.0–15.0)
MCH: 24.9 pg — ABNORMAL LOW (ref 26.0–34.0)
MCHC: 30 g/dL (ref 30.0–36.0)
MCV: 83 fL (ref 78.0–100.0)
Platelets: 248 10*3/uL (ref 150–400)
RBC: 4.42 MIL/uL (ref 3.87–5.11)
RDW: 26 % — AB (ref 11.5–15.5)
WBC: 8 10*3/uL (ref 4.0–10.5)

## 2016-05-07 LAB — BASIC METABOLIC PANEL
ANION GAP: 6 (ref 5–15)
BUN: 12 mg/dL (ref 6–20)
CO2: 26 mmol/L (ref 22–32)
Calcium: 8.4 mg/dL — ABNORMAL LOW (ref 8.9–10.3)
Chloride: 104 mmol/L (ref 101–111)
Creatinine, Ser: 0.7 mg/dL (ref 0.44–1.00)
GFR calc Af Amer: >60 mL/min (ref >60)
Glucose, Bld: 85 mg/dL (ref 65–99)
POTASSIUM: 3.5 mmol/L (ref 3.5–5.1)
SODIUM: 136 mmol/L (ref 135–145)

## 2016-05-07 LAB — TROPONIN I: Troponin I: <0.03 ng/mL (ref <0.03)

## 2016-05-07 MED ORDER — RANITIDINE HCL 150 MG PO TABS: 150.0000 mg | ORAL_TABLET | Freq: Two times a day (BID) | ORAL | 0 refills | Status: DC

## 2016-05-07 MED ORDER — AMOXICILLIN 500 MG PO CAPS: 500.0000 mg | ORAL_CAPSULE | Freq: Three times a day (TID) | ORAL | 0 refills | Status: DC

## 2016-05-07 MED ORDER — IOPAMIDOL (ISOVUE-370) INJECTION 76%
INTRAVENOUS | Status: AC
  Administered 2016-05-07: 100 mL
  Filled 2016-05-07: qty 100

## 2016-05-07 MED ORDER — PANTOPRAZOLE SODIUM 20 MG PO TBEC: 20.0000 mg | DELAYED_RELEASE_TABLET | Freq: Every day | ORAL | 0 refills | Status: DC

## 2016-05-07 MED ORDER — TRAMADOL HCL 50 MG PO TABS: 50.0000 mg | ORAL_TABLET | Freq: Four times a day (QID) | ORAL | 0 refills | Status: DC | PRN

## 2016-05-07 NOTE — ED Notes (Signed)
Pt returned from CT °

## 2016-05-07 NOTE — ED Notes (Signed)
Patient transported to CT 

## 2016-05-07 NOTE — ED Triage Notes (Signed)
The pt is c/o mid chest pain with anterior neck radiation for 2 days  History of the same  With some sob dizziness  lmp July 18th

## 2016-05-07 NOTE — ED Provider Notes (Signed)
[place] DEPT Provider Note   CSN: [id number] Arrival date & time: 05/07/16  [id number]  First Provider Contact:  First MD Initiated Contact with Patient 05/07/16 [phone]        History   Chief Complaint Chief Complaint  Patient presents with  . Chest Pain    HPI Tammie [name] is a 40 y.o. female.  Patient presents to the ER for evaluation of chest pain. She has been having constant substernal chest pain for nearly 2 days. She does not have any known coronary artery disease but does report a 4.8 cm ascending thoracic aortic aneurysm. He does not radiate to the back. There has not been any migratory pain. She is not short of breath. Patient does report that she is feeling a fullness in her throat and has a bad taste in her mouth. She has identified a tooth on the left side of her mouth that is tender and sensitive, think she has a dental infection. She has had similar symptoms with dental infection previously.      Past Medical History:  Diagnosis Date  . Abnormal Pap smear   . Anemia   . Aortic root enlargement ([place]) 5/17   5.1cm thoracic aorta by echo  . Asthma    as child  . Esophageal reflux   . Fibroid   . Hypertension   . Hypopotassemia   . Obesity    s/p gastric sleeve  . Palpitations   . Paroxysmal atrial fibrillation (HCC)   . Pregnancy induced hypertension   . Premature ventricular contraction   . Seizure [facility])     Patient Active Problem List   Diagnosis Date Noted  . NSVT (nonsustained ventricular tachycardia) ([place]) 03/25/2016  . Seizure ([place]) 03/06/2016  . Atrial fibrillation ([place])   . Essential hypertension 01/20/2016  . Faintness 01/20/2016  . Tooth fracture 11/08/2013  . OBESITY-MORBID (>100') 02/03/2009  . HYPERTENSION, BENIGN 02/03/2009    Past Surgical History:  Procedure Laterality Date  . COLPOSCOPY    . [place] RESECTION  9/16   [facility]  . OOPHORECTOMY     Left  . TUBAL LIGATION      OB  History    Gravida Para Term Preterm AB Living   2 2 2     2    SAB TAB Ectopic Multiple Live Births                   Home Medications    Prior to Admission medications   Medication Sig Start Date End Date Taking? Authorizing Provider  diltiazem (CARDIZEM) 30 MG tablet Take 1 tablet (30 mg total) by mouth 4 (four) times daily. Patient taking differently: Take 30 mg by mouth 4 (four) times daily as needed.  03/25/16  Yes Thompson Grayer, MD  levETIRAcetam (KEPPRA) 750 MG tablet Take 1 tablet (750 mg total) by mouth 2 (two) times daily. 03/31/16  Yes Donika K Patel, DO  amoxicillin (AMOXIL) 500 MG capsule Take 1 capsule (500 mg total) by mouth 3 (three) times daily. 05/07/16   Orpah Greek, MD  lubiprostone (AMITIZA) 24 MCG capsule Take 1 capsule (24 mcg total) by mouth 2 (two) times daily with a meal. Patient not taking: Reported on 04/21/2016 03/08/16   [name], MD  pantoprazole (PROTONIX) 20 MG tablet Take 1 tablet (20 mg total) by mouth daily. 05/07/16   Orpah Greek, MD  ranitidine (ZANTAC) 150 MG tablet Take 1 tablet (150 mg total) by mouth  2 (two) times daily. 05/07/16   Orpah Greek, MD  traMADol (ULTRAM) 50 MG tablet Take 1 tablet (50 mg total) by mouth every 6 (six) hours as needed. 05/07/16   Orpah Greek, MD    Family History Family History  Problem Relation Age of Onset  . Hypertension Father   . Kidney disease Father   . Cancer Father   . Diabetes Father   . Diabetes Maternal Grandmother   . Hypertension Paternal Grandmother   . Epilepsy Cousin   . Epilepsy Maternal Aunt   . Epilepsy Maternal Uncle   . Febrile seizures Son   . Asthma Son     Social History Social History  Substance Use Topics  . Smoking status: Never Smoker  . Smokeless tobacco: Never Used  . Alcohol use 0.0 oz/week     Comment: drank last night; does not usually drink     Allergies   Sulfa antibiotics; Sulfamethoxazole; and Tape   Review of  Systems Review of Systems  HENT: Positive for dental problem.   Cardiovascular: Positive for chest pain.  All other systems reviewed and are negative.    Physical Exam Updated Vital Signs BP 131/71   Pulse 69   Temp 98.2 F (36.8 C)   Resp 17   Ht 5\' 10"  (1.778 m)   Wt 242 lb 6 oz (109.9 kg)   LMP 04/26/2016   SpO2 99%   BMI 34.78 kg/m   Physical Exam  Constitutional: She is oriented to person, place, and time. She appears well-developed and well-nourished. No distress.  HENT:  Head: Normocephalic and atraumatic.  Right Ear: Hearing normal.  Left Ear: Hearing normal.  Nose: Nose normal.  Mouth/Throat: Oropharynx is clear and moist and mucous membranes are normal. Dental caries present.  Eyes: Conjunctivae and EOM are normal. Pupils are equal, round, and reactive to light.  Neck: Normal range of motion. Neck supple.  Cardiovascular: Regular rhythm, S1 normal and S2 normal.  Exam reveals no gallop and no friction rub.   No murmur heard. Pulmonary/Chest: Effort normal and breath sounds normal. No respiratory distress. She exhibits no tenderness.  Abdominal: Soft. Normal appearance and bowel sounds are normal. There is no hepatosplenomegaly. There is no tenderness. There is no rebound, no guarding, no tenderness at McBurney's point and negative Murphy's sign. No hernia.  Musculoskeletal: Normal range of motion.  Neurological: She is alert and oriented to person, place, and time. She has normal strength. No cranial nerve deficit or sensory deficit. Coordination normal. GCS eye subscore is 4. GCS verbal subscore is 5. GCS motor subscore is 6.  Skin: Skin is warm, dry and intact. No rash noted. No cyanosis.  Psychiatric: She has a normal mood and affect. Her speech is normal and behavior is normal. Thought content normal.  Nursing note and vitals reviewed.    ED Treatments / Results  Labs (all labs ordered are listed, but only abnormal results are displayed) Labs Reviewed   BASIC METABOLIC PANEL - Abnormal; Notable for the following:       Result Value   Calcium 8.4 (*)    All other components within normal limits  CBC - Abnormal; Notable for the following:    Hemoglobin 11.0 (*)    MCH 24.9 (*)    RDW 26.0 (*)    All other components within normal limits  TROPONIN I    EKG  EKG Interpretation  Date/Time:  Saturday May 07 2016 03:06:22 EDT Ventricular Rate:  89 PR  Interval:  180 QRS Duration: 90 QT Interval:  364 QTC Calculation: 442 R Axis:   73 Text Interpretation:  Normal sinus rhythm Normal ECG When compared with ECG of 03/31/2016, No significant change was found Confirmed by [facility]  MD, DAVID ([id number]) on 05/07/2016 3:07:25 AM       Radiology Dg Chest 2 View  Result Date: 05/07/2016 CLINICAL DATA:  40 year old female with chest pain and shortness of breath EXAM: CHEST  2 VIEW COMPARISON:  Chest radiograph dated 03/06/2016 FINDINGS: The heart size and mediastinal contours are within normal limits. Both lungs are clear. The visualized skeletal structures are unremarkable. IMPRESSION: No active cardiopulmonary disease. Electronically Signed   By: Anner Crete M.D.   On: 05/07/2016 04:19  Ct Angio Chest Aorta W And/or Wo Contrast  Result Date: 05/07/2016 CLINICAL DATA:  Chest pain for 2 days. EXAM: CT ANGIOGRAPHY CHEST WITH CONTRAST TECHNIQUE: Multidetector CT imaging of the chest was performed using the standard protocol during bolus administration of intravenous contrast. Multiplanar CT image reconstructions and MIPs were obtained to evaluate the vascular anatomy. CONTRAST:  80 mL Isovue 370. COMPARISON:  MRA chest 04/13/2016.  Chest radiograph 05/07/2016. FINDINGS: Mediastinum/Lymph Nodes: No pulmonary emboli or thoracic aortic dissection identified. No masses or pathologically enlarged lymph nodes identified. Previously demonstrated on MRA is a 4.8 cm ascending aortic aneurysm without complicating features. Three vessel brachiocephalic arterial  anatomy without proximal stenosis. Atheromatous irregularity of the transverse arch and descending aorta without mural thrombus. Proximal abdominal aorta unremarkable without ostial narrowing of the visualized visceral vessels Lungs/Pleura: No pulmonary mass, infiltrate, or effusion. Upper abdomen: No acute findings. Musculoskeletal: No chest wall mass or suspicious bone lesions identified. Review of the MIP images confirms the above findings. IMPRESSION: Uncomplicated appearing 4.8 cm ascending aortic aneurysm. Please see MRA chest report for additional findings regarding recommend follow-up. No evidence for dissection, or interval enlargement. No evidence for pulmonary emboli or infiltrates. Electronically Signed   By: Staci Righter M.D.   On: 05/07/2016 07:22   Procedures Procedures (including critical care time)  Medications Ordered in ED Medications  iopamidol (ISOVUE-370) 76 % injection (100 mLs  Contrast Given 05/07/16 [id number])     Initial Impression / Assessment and Plan / ED Course  I have reviewed the triage vital signs and the nursing notes.  Pertinent labs & imaging results that were available during my care of the patient were reviewed by me and considered in my medical decision making (see chart for details).  Clinical Course    Patient presents with chest pain. She has been having pain for nearly 2 days without relenting. EKG does not show ischemia or infarct. Troponin negative. Patient does have a history of thoracic aortic aneurysm, however. CT angios was performed and does not show any change, no evidence of dissection or complication. Patient complaining of dental pain. She does have some dental tenderness without obvious signs of abscess. She will be treated empirically with amoxicillin. She was also treated for possible reflux with the sensation of reflux she is having in her throat and upper esophagus. She will follow-up with primary doctor.  Final Clinical Impressions(s) / ED  Diagnoses   Final diagnoses:  Nonspecific chest pain  Toothache    New Prescriptions New Prescriptions   AMOXICILLIN (AMOXIL) 500 MG CAPSULE    Take 1 capsule (500 mg total) by mouth 3 (three) times daily.   PANTOPRAZOLE (PROTONIX) 20 MG TABLET    Take 1 tablet (20 mg total) by mouth daily.  RANITIDINE (ZANTAC) 150 MG TABLET    Take 1 tablet (150 mg total) by mouth 2 (two) times daily.   TRAMADOL (ULTRAM) 50 MG TABLET    Take 1 tablet (50 mg total) by mouth every 6 (six) hours as needed.     Orpah Greek, MD 05/07/16 [phone]

## 2016-05-10 ENCOUNTER — Encounter: Payer: Self-pay | Admitting: Nurse Practitioner

## 2016-05-10 ENCOUNTER — Encounter (INDEPENDENT_AMBULATORY_CARE_PROVIDER_SITE_OTHER): Payer: Self-pay

## 2016-05-10 ENCOUNTER — Ambulatory Visit (INDEPENDENT_AMBULATORY_CARE_PROVIDER_SITE_OTHER): Payer: BLUE CROSS/BLUE SHIELD | Admitting: Nurse Practitioner

## 2016-05-10 VITALS — BP 110/70 | HR 80 | Ht 70.0 in | Wt 242.8 lb

## 2016-05-10 DIAGNOSIS — I480 Paroxysmal atrial fibrillation: Secondary | ICD-10-CM

## 2016-05-10 DIAGNOSIS — I10 Essential (primary) hypertension: Secondary | ICD-10-CM

## 2016-05-10 DIAGNOSIS — I712 Thoracic aortic aneurysm, without rupture, unspecified: Secondary | ICD-10-CM

## 2016-05-10 NOTE — Patient Instructions (Signed)
We will be checking the following labs today - NONE   Medication Instructions:    Continue with your current medicines.     Testing/Procedures To Be Arranged:  N/A  Follow-Up:   See me in 3 months    Other Special Instructions:   I am giving you a letter stating your restrictions when you are able to return to work.     If you need a refill on your cardiac medications before your next appointment, please call your pharmacy.   Call the [place] office at [phone] if you have any questions, problems or concerns.

## 2016-05-10 NOTE — Progress Notes (Signed)
CARDIOLOGY OFFICE NOTE  Date:  05/10/2016    Tammie [name] Date of Birth: [birth year] Medical Record [id number]  PCP:  Tammie Graves, DO  Cardiologist:  Former patient of Dr. Verl [name]  Chief Complaint  Patient presents with  . Cardiac Valve Problem  . Hypertension    Follow up visit - former patient of Dr. Winnifred [name]    History of Present Illness: Tammie [name] is a 40 y.o. female who presents today for a follow up visit. She has a history of HTN, morbid obesity, and PVCs. Other issues include seizure disorder and she has been found to have a bicuspid aortic valve and dilated ascending aorta and was referred to [facility] - seeing EBG. She has had successful weight loss following gastric sleeve.   She has been evaluated by EP after being found to have NSVT at afib on 30 day monitor. She reported initially having tachycardia diagnosed in her 40s after having tachypalpitations.  She has been treated with verapamil.  She was previously followed by Dr Tammie [name].  She had had a seizure and while recovering from this episode, she had abrupt heart "racing" and was brought to [facility] where she was found to have atrial fibrillation.  Her atrial fibrillation stopped on its own.  Repeat event monitor was negative. Has been referred for anemia.   She has cancelled/no showed for numerous visits with providers.  I have not formally seen her in over 2 years. She is a former patient of Dr. Winnifred [name].    ER visit this past weekend for chest pain - had CT scan - negative for dissection.   Comes in today. Here alone. Has continued to have seizures - on higher doses of Keppra. She really questions this diagnosis - her spells happen mostly at night. She notes that she has had lower BP's - especially at night. She does not use salt. She will typically try to elevate her legs at night - if she does - she does fine. If she does not - she will have "had another spell" - her bed is wet, her tongue is "chewed up".  She does not have spells during the day apparently. She has lots of questions about her aneurysm. Asking about lifting restrictions. 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 She is currently out of work and will not be allowed to go back until she has been seizure free for 30 days. She is very upset about being told to not get pregnant. She has been to a fertility clinic. She is currently using no protection to prevent pregnancy. Rare palpitations. Not using the Diltiazem. She went to the ER this past weekend - had gotten very upset about someone's death.   Past Medical History:  Diagnosis Date  . Abnormal Pap smear   . Anemia   . Aortic root enlargement ([place]) 5/17   5.1cm thoracic aorta by echo  . Asthma    as child  . Esophageal reflux   . Fibroid   . Hypertension   . Hypopotassemia   . Obesity    s/p gastric sleeve  . Palpitations   . Paroxysmal atrial fibrillation (HCC)   . Pregnancy induced hypertension   . Premature ventricular contraction   . Seizure [facility])     Past Surgical History:  Procedure Laterality Date  . COLPOSCOPY    . [place] RESECTION  9/16   [facility]  . OOPHORECTOMY  Left  . TUBAL LIGATION       Medications: Current Outpatient Prescriptions  Medication Sig Dispense Refill  . amoxicillin (AMOXIL) 500 MG capsule Take 1 capsule (500 mg total) by mouth 3 (three) times daily. 30 capsule 0  . levETIRAcetam (KEPPRA) 750 MG tablet Take 1 tablet (750 mg total) by mouth 2 (two) times daily. 60 tablet 5  . lidocaine (XYLOCAINE) 2 % solution Use as directed 20 mLs in the mouth or throat as needed for mouth pain.    . pantoprazole (PROTONIX) 20 MG tablet Take 1 tablet (20 mg total) by mouth daily. 30 tablet 0  . ranitidine (ZANTAC) 150 MG tablet Take 1 tablet (150 mg total) by mouth 2 (two) times daily. 60 tablet 0  . traMADol (ULTRAM) 50 MG tablet Take 1 tablet (50 mg total) by mouth every 6 (six)  hours as needed. 15 tablet 0  . diltiazem (CARDIZEM) 30 MG tablet Take 30 mg by mouth 4 (four) times daily as needed.     No current facility-administered medications for this visit.     Allergies: Allergies  Allergen Reactions  . Sulfa Antibiotics Anaphylaxis, Rash and Other (See Comments)    "Burning"  . Sulfamethoxazole Anaphylaxis, Rash and Other (See Comments)    burning  . Tape Rash    Leads on heart monitor have caused a rash    Social History: The patient  reports that she has never smoked. She has never used smokeless tobacco. She reports that she drinks alcohol. She reports that she does not use drugs.   Family History: The patient's family history includes Asthma in her son; Cancer in her father; Diabetes in her father and maternal grandmother; Epilepsy in her cousin, maternal aunt, and maternal uncle; Febrile seizures in her son; Hypertension in her father and paternal grandmother; Kidney disease in her father.   Review of Systems: Please see the history of present illness.   Otherwise, the review of systems is positive for none.   All other systems are reviewed and negative.   Physical Exam: VS:  BP 110/70   Pulse 80   Ht 5\' 10"  (1.778 m)   Wt 242 lb 12.8 oz (110.1 kg)   LMP 04/26/2016   SpO2 98%   BMI 34.84 kg/m  .  BMI Body mass index is 34.84 kg/m.  Wt Readings from Last 3 Encounters:  05/10/16 242 lb 12.8 oz (110.1 kg)  05/07/16 242 lb 6 oz (109.9 kg)  04/21/16 240 lb (108.9 kg)    General: Pleasant. Well developed, well nourished and in no acute distress.   HEENT: Normal.  Neck: Supple, no JVD, carotid bruits, or masses noted.  Cardiac: Regular rate and rhythm. No murmurs, rubs, or gallops. No edema.  Respiratory:  Lungs are clear to auscultation bilaterally with normal work of breathing.  GI: Soft and nontender.  MS: No deformity or atrophy. Gait and ROM intact.  Skin: Warm and dry. Color is normal.  Neuro:  Strength and sensation are intact and  no gross focal deficits noted.  Psych: Alert, appropriate and with normal affect.   LABORATORY DATA:  EKG:  EKG is not ordered today.  Lab Results  Component Value Date   WBC 8.0 05/07/2016   HGB 11.0 (L) 05/07/2016   HCT 36.7 05/07/2016   PLT 248 05/07/2016   GLUCOSE 85 05/07/2016   CHOL 183 06/18/2013   TRIG 75.0 06/18/2013   HDL 42.60 06/18/2013   LDLCALC 125 (H) 06/18/2013   ALT  13 (L) 03/06/2016   AST 18 03/06/2016   NA 136 05/07/2016   K 3.5 05/07/2016   CL 104 05/07/2016   CREATININE 0.70 05/07/2016   BUN 12 05/07/2016   CO2 26 05/07/2016   TSH 0.561 03/06/2016   HGBA1C 4.7 11/16/2011    BNP (last 3 results) No results for input(s): BNP in the last 8760 hours.  ProBNP (last 3 results) No results for input(s): PROBNP in the last 8760 hours.   Other Studies Reviewed Today:  Event monitor Study Highlights   Predominant rhythm is sinus rhythm No sustained arrhythmias  3 symptomatic transmissions were recorded.  1 corresponded to sinus rhythm, 1 corresponded to sinus rhythm with very frequent premature atrial contractions, and 1 corresponded to sinus rhythm with 10 beats of nonsustained ventricular tachycardia  No other ventricular ectopy recorded     ECHO 02/2016 Study Conclusions  - Left ventricle: The cavity size was normal. Wall thickness was  normal. Systolic function was normal. The estimated ejection  fraction was in the range of 50% to 55%. Wall motion was normal;  there were no regional wall motion abnormalities. Features are  consistent with a pseudonormal left ventricular filling pattern,  with concomitant abnormal relaxation and increased filling  pressure (grade 2 diastolic dysfunction). - Left atrium: The atrium was mildly dilated.  Impressions:  - Low normal LV systolic function; grade 2 diastolic dysfunction;  mild LAE; mild TR; severely dilated ascending aorta (51 mm);  suggest CTA or MRA to further assess.  MRI  IMPRESSION from 04/2016: 1. 4.8 cm ascending aortic aneurysm without complicating features. Recommend semi-annual imaging followup by CTA or MRA and referral to cardiothoracic surgery if not already obtained. This recommendation follows 2010 ACCF/AHA/AATS/ACR/ASA/SCA/SCAI/SIR/STS/SVM Guidelines for the Diagnosis and Management of Patients With Thoracic Aortic Disease. Circulation. 2010; 121: [id number]  Assessment/Plan: 1. History of HTN - BP now running low - she may liberalize her salt a little - goal is to keep her BP below 140/90.   2. Dilated ascending aortic aneurysm - seeing EBG - we have reviewed her restrictions - she understands to avoid strenuous lifting over 25 pounds, avoid pregnancy, & avoid HTN. She was to have a repeat scan in 6 months. Recent ER visit for chest pain - with negative CT for dissection. Strongly encouraged to start using some type of birth control. Pregnancy is not advised in this situation.   3. PAF - lone episode - Dr. Rayann Heman wishes to avoid anticoagulation given recent onset of seizures - to use prn Cardizem - she has not had to use.   4. History of NSVT - No evidence of structural heart disease.  No FH of arrhythmias.  No ischemic symptoms - recommended previously - to maintain electrolytes within therapeutic range and continue to follow conservatively  4. Obesity - prior gastric sleeve with successful weight loss - she is down 50 pounds since her last visit with me.  5. Seizure disorder - followed by neurology. Not able to return to work until she has been seizure free for 30 days. I have written her a letter to submit to her employer.   Current medicines are reviewed with the patient today.  The patient does not have concerns regarding medicines other than what has been noted above.  The following changes have been made:  See above.  Labs/ tests ordered today include:   No orders of the defined types were placed in this encounter.    Disposition:    FU with me  in 3 months.   Patient is agreeable to this plan and will call if any problems develop in the interim.   Signed: Burtis Junes, RN, ANP-C 05/10/2016 11:22 AM  [facility] [address] [place], [place]  [address] Phone: [phone] Fax: [phone]

## 2016-05-13 ENCOUNTER — Other Ambulatory Visit: Payer: Self-pay | Admitting: Nurse Practitioner

## 2016-05-13 ENCOUNTER — Telehealth: Payer: Self-pay | Admitting: Nurse Practitioner

## 2016-05-13 MED ORDER — FLUCONAZOLE 150 MG PO TABS: 150.0000 mg | ORAL_TABLET | Freq: Every day | ORAL | 0 refills | Status: DC

## 2016-05-13 NOTE — Telephone Encounter (Signed)
T/w pt is aware Truitt Merle, Np, will be sending in script today.  To [facility] on [address]. Will route to [place] .

## 2016-05-13 NOTE — Telephone Encounter (Signed)
New message      Pt c/o medication issue:  1. Name of Medication: diflucan  2. How are you currently taking this medication (dosage and times per day)? 1 pill  3. Are you having a reaction (difficulty breathing--STAT)? no  4. What is your medication issue? The pt does not have a primary care physician, and was asking for the PA to call in the prescription  The pt was wondering if this could be done cause of the yeast infection from the antibiotic

## 2016-05-13 NOTE — Telephone Encounter (Signed)
I have sent this in for her.

## 2016-05-25 ENCOUNTER — Telehealth: Payer: Self-pay | Admitting: Neurology

## 2016-05-25 NOTE — Telephone Encounter (Signed)
Patient wants a call back from [place] asap about her letter to return to work she is aware that ashely is already gone for today and will call her back tomorrow please call [phone]

## 2016-05-26 NOTE — Telephone Encounter (Signed)
Patient moved her appt from 06-23-16 to 05-31-16 but needs to talk to someone about her work please call [phone]

## 2016-05-26 NOTE — Telephone Encounter (Signed)
I called patient back and she said that her work is probably not going to let her come back because they say she is a liability.  Instructed her to keep appointment on 05-31-16 to discuss further with Dr. Posey Pronto.  Patient agreed with plan.

## 2016-05-31 ENCOUNTER — Encounter: Payer: Self-pay | Admitting: Neurology

## 2016-05-31 ENCOUNTER — Ambulatory Visit (INDEPENDENT_AMBULATORY_CARE_PROVIDER_SITE_OTHER): Payer: BLUE CROSS/BLUE SHIELD | Admitting: Neurology

## 2016-05-31 VITALS — BP 110/80 | HR 85 | Ht 70.0 in | Wt 245.4 lb

## 2016-05-31 DIAGNOSIS — F4323 Adjustment disorder with mixed anxiety and depressed mood: Secondary | ICD-10-CM

## 2016-05-31 DIAGNOSIS — G40909 Epilepsy, unspecified, not intractable, without status epilepticus: Secondary | ICD-10-CM | POA: Diagnosis not present

## 2016-05-31 NOTE — Progress Notes (Signed)
Follow-up Visit   Date: 05/31/16    Tammie [name] MRN: [id number] DOB: 1976/04/01   Interim History: Tammie [name] is a 40 y.o. right-handed African American female with obesity s/p gastric sleeve with 57lb weight loss (October 2016) returning to the clinic for follow-up of seizure disorder.  The patient was accompanied to the clinic by self.  History of present illness: She had her first seizure in April 2017.  Her sons had noticed that she was not awake by 10am which is very unusual for her so went to check on her and her eldest son found her in grunting in her bed and staring at him.  She was not responding to him and was very sleepy. Her does not recall seeing abnormal movements, but states that it took about 2 minutes for her to respond and answer questions.  She is amnestic of the event.  She did have urinary he reports wetting her bed and waking up with her tongue bleeding. She recalls having a lot of body aches and being confused. Her son called EMS where she was evaluated in the ED.  She had another spell on May 28th, where again, she was found by her mother unconscious in her bed, again with urinary incontinence, and woke up on a bloody pillow.  On event, she recalls having a racing heart rate.  Duration of these spells is unknown since it occurs at night.  She reports being extremely tired with generalized myalgias.  She was admitted to [facility] on 5/28 - 03/08/2016 for evaluation. When she presented to the ER, patient was somnolent and noted to be in atrial fibrillation, which self converted later that day. She underwent MRI brain which was normal and started on Keppra 500mg  twice daily.  She gets tired with Keppra, but it is less so than when she first started the medication.  She has not had any further spells.   She recalls having spells of staring for several years with reduced peripheral vision. She would get really tired and fatigued following these  spells.  There was no abnormal movements. She always attributed this to her PCP.   She reports taking verapamil three times daily for blood pressure but since loosing 57lb her blood pressure has normalized.  She feels that her blood pressure was getting too low at night time which may have triggered her seizures.   Seizure triggers include stress.  For the past two months, she has been working overtime as a Engineer, manufacturing.   UPDATE 05/31/2016:   Her last seizure occurred on July 14th in the setting of skipping her Keppra because she was in denial that she actually has seizures and needed medication.  Since being compliant with her medications, she has been seizure-free on Keppra 750mg  twice daily.  The medication makes her a little sleepy, but otherwise is tolerating it well.  Unfortunately, she has had a number of other medical comorbidities including newly found bicuspid aortic valave and dilated ascending aorta.  She has been restricted lifting no more than 25lb.   She is asking to see a counselor because she feels overwhelmed with all of her medical issues.   Medications:  Current Outpatient Prescriptions on File Prior to Visit  Medication Sig Dispense Refill  . diltiazem (CARDIZEM) 30 MG tablet Take 30 mg by mouth 4 (four) times daily as needed.    . fluconazole (DIFLUCAN) 150 MG tablet Take 1 tablet (150 mg total) by mouth daily. 1  tablet 0  . levETIRAcetam (KEPPRA) 750 MG tablet Take 1 tablet (750 mg total) by mouth 2 (two) times daily. 60 tablet 5  . lidocaine (XYLOCAINE) 2 % solution Use as directed 20 mLs in the mouth or throat as needed for mouth pain.    . pantoprazole (PROTONIX) 20 MG tablet Take 1 tablet (20 mg total) by mouth daily. 30 tablet 0  . ranitidine (ZANTAC) 150 MG tablet Take 1 tablet (150 mg total) by mouth 2 (two) times daily. 60 tablet 0  . traMADol (ULTRAM) 50 MG tablet Take 1 tablet (50 mg total) by mouth every 6 (six) hours as needed. 15 tablet 0   No current  facility-administered medications on file prior to visit.     Allergies:  Allergies  Allergen Reactions  . Sulfa Antibiotics Anaphylaxis, Rash and Other (See Comments)    "Burning"  . Sulfamethoxazole Anaphylaxis, Rash and Other (See Comments)    burning  . Tape Rash    Leads on heart monitor have caused a rash    Review of Systems:  CONSTITUTIONAL: No fevers, chills, night sweats, or weight loss.  EYES: No visual changes or eye pain ENT: No hearing changes.  No history of nose bleeds.   RESPIRATORY: No cough, wheezing and shortness of breath.   CARDIOVASCULAR: Negative for chest pain, and palpitations.   GI: Negative for abdominal discomfort, blood in stools or black stools.  No recent change in bowel habits.   GU:  No history of incontinence.   MUSCLOSKELETAL: No history of joint pain or swelling.  No myalgias.   SKIN: Negative for lesions, rash, and itching.   ENDOCRINE: Negative for cold or heat intolerance, polydipsia or goiter.   PSYCH:  + depression + anxiety symptoms.   NEURO: As Above.   Vital Signs:  BP 110/80   Pulse 85   Ht 5\' 10"  (1.778 m)   Wt 245 lb 6 oz (111.3 kg)   LMP 04/26/2016   SpO2 97%   BMI 35.21 kg/m    Neurological Exam: MENTAL STATUS including orientation to time, place, person, recent and remote memory, attention span and concentration, language, and fund of knowledge is normal.  She is tearful today. Speech is not dysarthric.  CRANIAL NERVES: Pupils equal round and reactive to light.  Normal conjugate, extra-ocular eye movements in all directions of gaze.  No ptosis.  Face is symmetric. Palate elevates symmetrically.  Tongue is midline.  MOTOR:  Motor strength is 5/5 in all extremities.  No pronator drift.  Tone is normal.    MSRs:  Reflexes are 2+/4 throughout.  COORDINATION/GAIT:   Gait narrow based and stable.   Data: MRI brain wo contrast 03/06/2016:  Motion degraded unenhanced examination without seizure focus identified.   Routine  EEG 03/21/2016:  Normal  Lab Results  Component Value Date   ALT 13 (L) 03/06/2016   AST 18 03/06/2016   ALKPHOS 47 03/06/2016   BILITOT 0.3 03/06/2016     IMPRESSION/PLAN: Tammie [name] is a pleasant 40 year-old female returning for evaluation of generalized seizure disorder. Her work-up has included MRI brain and EEG which has been normal.  Her neurological exam also is non-focal.  Based on her history, her seizures seem to occur prior to awakening.  Thus far, she has had had three seizures associated with tongue biting, incontinence, and postictal confusion and fatigue, occuring out of sleep.  Given that her seizures have occurred while sleeping or upon awakening and she has been seizure-free for >  30 days, she can return to work and I have completed her work-related documents to reflect this.  She is still not able to drive until seizure-free for 6 months (last seizure 04/22/2016).  She has been placed on lifting restrictions as per cardiology to her dilated ascending aortic aneurysm.  She will remain on Keppra 750mg  twice daily.  She is understandably feeling overwhelmed with her new medical conditions and is requesting to see a counselor. I will be glad to place a referral to behavior therapy.  Return to clinic in 4 months   The duration of this appointment visit was 30 minutes of face-to-face time with the patient.  Greater than 50% of this time was spent in counseling, explanation of diagnosis, planning of further management, and coordination of care.   Thank you for allowing me to participate in patient's care.  If I can answer any additional questions, I would be pleased to do so.    Sincerely,    Donika K. Posey Pronto, DO

## 2016-05-31 NOTE — Patient Instructions (Signed)
1.  Continue Keppra 750mg  twice daily 2.  No driving until seizure-free for 6 months  3.  Referral to behavior therapy  Return to clinic in 4-5 months

## 2016-06-03 ENCOUNTER — Encounter (HOSPITAL_COMMUNITY): Payer: Self-pay

## 2016-06-20 ENCOUNTER — Ambulatory Visit (HOSPITAL_COMMUNITY): Payer: Self-pay | Admitting: Psychiatry

## 2016-06-23 ENCOUNTER — Ambulatory Visit: Payer: BLUE CROSS/BLUE SHIELD | Admitting: Neurology

## 2016-06-27 ENCOUNTER — Ambulatory Visit (INDEPENDENT_AMBULATORY_CARE_PROVIDER_SITE_OTHER): Payer: BLUE CROSS/BLUE SHIELD | Admitting: Psychiatry

## 2016-06-27 DIAGNOSIS — F4323 Adjustment disorder with mixed anxiety and depressed mood: Secondary | ICD-10-CM

## 2016-06-28 ENCOUNTER — Other Ambulatory Visit: Payer: Self-pay | Admitting: Internal Medicine

## 2016-06-28 NOTE — Progress Notes (Signed)
   THERAPIST PROGRESS NOTE  Pt. Is 42 mother of two and guardian of 38 year old niece. Pt. Was diagnosed with epilepsy and cardiac disease. Pt. Has not been compliant with epilepsy medication and challenged with acceptance of the diagnosis. Pt. Has be released for return to work, but employer has not provided medically acceptable accommodations. Pt. Presents with moderate depression with tearfulness, fatigue, sleep disturbance, and irritability. Pt. Is cooperative and interested in individual and/or co-occurring 2x weekly group.  Nancie Neas, [facility] 06/28/2016

## 2016-06-29 ENCOUNTER — Other Ambulatory Visit: Payer: Self-pay | Admitting: *Deleted

## 2016-06-29 MED ORDER — RANITIDINE HCL 150 MG PO TABS: 150.0000 mg | ORAL_TABLET | Freq: Two times a day (BID) | ORAL | 11 refills | Status: DC

## 2016-06-29 NOTE — Telephone Encounter (Signed)
She can have this.

## 2016-06-30 ENCOUNTER — Ambulatory Visit (HOSPITAL_COMMUNITY): Payer: Self-pay | Admitting: Licensed Clinical Social Worker

## 2016-07-18 ENCOUNTER — Ambulatory Visit (INDEPENDENT_AMBULATORY_CARE_PROVIDER_SITE_OTHER): Payer: BLUE CROSS/BLUE SHIELD | Admitting: Nurse Practitioner

## 2016-07-18 ENCOUNTER — Encounter: Payer: Self-pay | Admitting: Nurse Practitioner

## 2016-07-18 ENCOUNTER — Encounter (INDEPENDENT_AMBULATORY_CARE_PROVIDER_SITE_OTHER): Payer: Self-pay

## 2016-07-18 ENCOUNTER — Ambulatory Visit (HOSPITAL_COMMUNITY): Payer: Self-pay | Admitting: Licensed Clinical Social Worker

## 2016-07-18 VITALS — BP 110/80 | HR 89 | Ht 70.0 in | Wt 248.8 lb

## 2016-07-18 DIAGNOSIS — I712 Thoracic aortic aneurysm, without rupture, unspecified: Secondary | ICD-10-CM

## 2016-07-18 DIAGNOSIS — R5383 Other fatigue: Secondary | ICD-10-CM

## 2016-07-18 DIAGNOSIS — I480 Paroxysmal atrial fibrillation: Secondary | ICD-10-CM | POA: Diagnosis not present

## 2016-07-18 DIAGNOSIS — I10 Essential (primary) hypertension: Secondary | ICD-10-CM

## 2016-07-18 LAB — CBC
HCT: 38.6 % (ref 35.0–45.0)
Hemoglobin: 12.8 g/dL (ref 11.7–15.5)
MCH: 30 pg (ref 27.0–33.0)
MCHC: 33.2 g/dL (ref 32.0–36.0)
MCV: 90.4 fL (ref 80.0–100.0)
MPV: 9.8 fL (ref 7.5–12.5)
Platelets: 247 10*3/uL (ref 140–400)
RBC: 4.27 MIL/uL (ref 3.80–5.10)
RDW: 14.8 % (ref 11.0–15.0)
WBC: 7.5 10*3/uL (ref 3.8–10.8)

## 2016-07-18 LAB — HEPATIC FUNCTION PANEL
ALT: 8 U/L (ref 6–29)
AST: 11 U/L (ref 10–30)
Albumin: 3.9 g/dL (ref 3.6–5.1)
Alkaline Phosphatase: 50 U/L (ref 33–115)
Bilirubin, Direct: 0.1 mg/dL (ref <=0.2)
Indirect Bilirubin: 0.4 mg/dL (ref 0.2–1.2)
Total Bilirubin: 0.5 mg/dL (ref 0.2–1.2)
Total Protein: 7.4 g/dL (ref 6.1–8.1)

## 2016-07-18 LAB — BASIC METABOLIC PANEL
BUN: 13 mg/dL (ref 7–25)
CO2: 24 mmol/L (ref 20–31)
Calcium: 9 mg/dL (ref 8.6–10.2)
Chloride: 102 mmol/L (ref 98–110)
Creat: 0.78 mg/dL (ref 0.50–1.10)
Glucose, Bld: 86 mg/dL (ref 65–99)
Potassium: 4.1 mmol/L (ref 3.5–5.3)
Sodium: 137 mmol/L (ref 135–146)

## 2016-07-18 LAB — TSH: TSH: 1.52 mIU/L

## 2016-07-18 NOTE — Patient Instructions (Addendum)
We will be checking the following labs today - BMET, CBC, HPF & TSH  Medication Instructions:    Continue with your current medicines.   Use Miralax/stool softeners as needed    Testing/Procedures To Be Arranged:  N/A  Follow-Up:   See me in 3 months    Other Special Instructions:   Letter given today regarding job restrictions    If you need a refill on your cardiac medications before your next appointment, please call your pharmacy.   Call the [place] office at [phone] if you have any questions, problems or concerns.

## 2016-07-18 NOTE — Progress Notes (Signed)
CARDIOLOGY OFFICE NOTE  Date:  07/18/2016    Tammie [name] Date of Birth: 13-Nov-1975 Medical Record [id number]  PCP:  Octavio Graves, DO  Cardiologist:  Servando Snare     Chief Complaint  Patient presents with  . Hypertension    2 month check - seen for Dr. Rayann Heman - former patient of [place]    History of Present Illness: Tammie [name] is a 40 y.o. female who presents today for a 2 month check. Former patient of Dr. Winnifred Friar.   She has a history of HTN, morbid obesity, and PVCs. Other issues include seizure disorder and she has been found to have a bicuspid aortic valve and dilated ascending aorta and was referred to [facility] - seeing EBG. She has had successful weight loss following gastric sleeve.   She has been evaluated by EP after being found to have NSVT and afib on 30 day monitor. She reported initially having tachycardia diagnosed in her 24s after having tachypalpitations. She has been treated with verapamil.  She had had a seizure and while recovering from this episode, she had abrupt heart "racing" and was brought to [facility] where she was found to have atrial fibrillation. Her atrial fibrillation stopped on its own. Repeat event monitor was negative back in June. Has been referred for anemia.   ER visit back in July for chest pain - had CT scan - negative for dissection.  I last saw her back in early August - has continued to have seizures - on higher doses of Keppra - she has continued to question this diagnosis - wonders if it is precipitated by low BP. She was currently out of work and is not allowed to go back until she has been seizure free for 30 days. Had lots of questions about her aneurysm - wanting to get pregnant - not using any birth control and is sexually active.  She was very upset about being told to not get pregnant. Discussed her lifting restrictions which clearly interferes with her job. 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   Comes in today. Here alone.  Still with no PCP. BP goes low in the 90's  - mostly at night - and she will get a little lightheaded. Her partner notes some "jerking movements" at night. No frank syncope. Rare palpitation. She is quite frustrated about her job - apparently she needs to be released with no restrictions in order to return - this will not be possible. She is not able to lift over 25# due to her aneurysm. She has had some stomach/back issues. Some constipation. Not short of breath. Very fatigued.    Past Medical History:  Diagnosis Date  . Abnormal Pap smear   . Anemia   . Aortic root enlargement ([place]) 5/17   5.1cm thoracic aorta by echo  . Asthma    as child  . Esophageal reflux   . Fibroid   . Hypertension   . Hypopotassemia   . Obesity    s/p gastric sleeve  . Palpitations   . Paroxysmal atrial fibrillation ([facility])   . Pregnancy induced hypertension   . Premature ventricular contraction   . Seizure [facility])     Past Surgical History:  Procedure Laterality Date  . COLPOSCOPY    . [place] RESECTION  9/16   [facility]  . OOPHORECTOMY     Left  . TUBAL LIGATION  Medications: Current Outpatient Prescriptions  Medication Sig Dispense Refill  . diltiazem (CARDIZEM) 30 MG tablet Take 30 mg by mouth 4 (four) times daily as needed.    . levETIRAcetam (KEPPRA) 750 MG tablet Take 1 tablet (750 mg total) by mouth 2 (two) times daily. 60 tablet 5  . lidocaine (XYLOCAINE) 2 % solution Use as directed 20 mLs in the mouth or throat as needed for mouth pain.    . pantoprazole (PROTONIX) 20 MG tablet Take 1 tablet (20 mg total) by mouth daily. 30 tablet 0  . ranitidine (ZANTAC) 150 MG tablet Take 1 tablet (150 mg total) by mouth 2 (two) times daily. 60 tablet 11   No current facility-administered medications for this visit.     Allergies: Allergies  Allergen Reactions  . Sulfa Antibiotics Anaphylaxis, Rash and Other (See  Comments)    "Burning"  . Sulfamethoxazole Anaphylaxis, Rash and Other (See Comments)    burning  . Tape Rash    Leads on heart monitor have caused a rash    Social History: The patient  reports that she has never smoked. She has never used smokeless tobacco. She reports that she drinks alcohol. She reports that she does not use drugs.   Family History: The patient's family history includes Asthma in her son; Cancer in her father; Diabetes in her father and maternal grandmother; Epilepsy in her cousin, maternal aunt, and maternal uncle; Febrile seizures in her son; Hypertension in her father and paternal grandmother; Kidney disease in her father.   Review of Systems: Please see the history of present illness.   Otherwise, the review of systems is positive for none.   All other systems are reviewed and negative.   Physical Exam: VS:  BP 110/80   Pulse 89   Ht 5\' 10"  (1.778 m)   Wt 248 lb 12.8 oz (112.9 kg)   SpO2 99% Comment: at rest  BMI 35.70 kg/m  .  BMI Body mass index is 35.7 kg/m.  Wt Readings from Last 3 Encounters:  07/18/16 248 lb 12.8 oz (112.9 kg)  05/31/16 245 lb 6 oz (111.3 kg)  05/10/16 242 lb 12.8 oz (110.1 kg)    General: Pleasant. Well developed, well nourished and in no acute distress.   HEENT: Normal.  Neck: Supple, no JVD, carotid bruits, or masses noted.  Cardiac: Regular rate and rhythm. No murmurs, rubs, or gallops. No edema.  Respiratory:  Lungs are clear to auscultation bilaterally with normal work of breathing.  GI: Soft and nontender.  MS: No deformity or atrophy. Gait and ROM intact.  Skin: Warm and dry. Color is normal.  Neuro:  Strength and sensation are intact and no gross focal deficits noted.  Psych: Alert, appropriate and with normal affect.   LABORATORY DATA:  EKG:  EKG is not ordered today.  Lab Results  Component Value Date   WBC 8.0 05/07/2016   HGB 11.0 (L) 05/07/2016   HCT 36.7 05/07/2016   PLT 248 05/07/2016   GLUCOSE 85  05/07/2016   CHOL 183 06/18/2013   TRIG 75.0 06/18/2013   HDL 42.60 06/18/2013   LDLCALC 125 (H) 06/18/2013   ALT 13 (L) 03/06/2016   AST 18 03/06/2016   NA 136 05/07/2016   K 3.5 05/07/2016   CL 104 05/07/2016   CREATININE 0.70 05/07/2016   BUN 12 05/07/2016   CO2 26 05/07/2016   TSH 0.561 03/06/2016   HGBA1C 4.7 11/16/2011    BNP (last 3 results) No results  for input(s): BNP in the last 8760 hours.  ProBNP (last 3 results) No results for input(s): PROBNP in the last 8760 hours.   Other Studies Reviewed Today:  Event monitor Study Highlights June 2017  Predominant rhythm is sinus rhythm No sustained arrhythmias  3 symptomatic transmissions were recorded. 1 corresponded to sinus rhythm, 1 corresponded to sinus rhythm with very frequent premature atrial contractions, and 1 corresponded to sinus rhythm with 10 beats of nonsustained ventricular tachycardia  No other ventricular ectopy recorded     ECHO 02/2016 Study Conclusions  - Left ventricle: The cavity size was normal. Wall thickness was  normal. Systolic function was normal. The estimated ejection  fraction was in the range of 50% to 55%. Wall motion was normal;  there were no regional wall motion abnormalities. Features are  consistent with a pseudonormal left ventricular filling pattern,  with concomitant abnormal relaxation and increased filling  pressure (grade 2 diastolic dysfunction). - Left atrium: The atrium was mildly dilated.  Impressions:  - Low normal LV systolic function; grade 2 diastolic dysfunction;  mild LAE; mild TR; severely dilated ascending aorta (51 mm);  suggest CTA or MRA to further assess.  MRI IMPRESSION from 04/2016: 1. 4.8 cm ascending aortic aneurysm without complicating features. Recommend semi-annual imaging followup by CTA or MRA and referral to cardiothoracic surgery if not already obtained. This recommendation follows 2010  ACCF/AHA/AATS/ACR/ASA/SCA/SCAI/SIR/STS/SVM Guidelines for the Diagnosis and Management of Patients With Thoracic Aortic Disease. Circulation. 2010; 121: [id number]  Assessment/Plan: 1. History of HTN - BP now running low - she does liberalize her salt a little - goal is to keep her BP below 140/90. No medicines to titrate down.   2. Dilated ascending aortic aneurysm - seeing EBG - we have reviewed her restrictions - she understands to avoid strenuous lifting over 25 pounds, avoid pregnancy, & avoid HTN. She was to have a repeat scan in 6 months. July ER visit for chest pain - with negative CT for dissection. Pregnancy is not advised in this situation. She tells me today that she has had prior tubal ligation. She is to see EBG in January. I have given her another letter stating that she will NEVER be able to lift over 25 pounds - her work environment needs to be modified.   3. PAF - lone episode - Dr. Rayann Heman wishes to avoid anticoagulation given onset of seizures - to use prn Cardizem - she has not had to use. No recurrence.   4. History of NSVT - No evidence of structural heart disease. No FH of arrhythmias. No ischemic symptoms - recommended previously - to maintain electrolytes within therapeutic range and continue to follow conservatively  4. Obesity - prior gastric sleeve with successful weight loss - she is down 50 pounds since her last visit with me.  5. Seizure disorder - followed by neurology. Not able to return to work until she has been seizure free for 30 days - not clear to me if she is still having some seizures at night.   6. Fatigue - will check TSH - suspect stress/frustration as the etiology.   7. Stomach pain - ? Constipation - would use Miralax and stool softeners as needed.    Current medicines are reviewed with the patient today.  The patient does not have concerns regarding medicines other than what has been noted above.  The following changes have been made:   See above.  Labs/ tests ordered today include:    Orders Placed This  Encounter  Procedures  . Basic metabolic panel  . CBC  . Hepatic function panel  . TSH     Disposition:   FU with me in 3 months.   Patient is agreeable to this plan and will call if any problems develop in the interim.   Signed: Burtis Junes, RN, ANP-C 07/18/2016 8:42 AM  [facility] [address] [place], [place]  [address] Phone: [phone] Fax: [phone]

## 2016-07-28 ENCOUNTER — Emergency Department (HOSPITAL_COMMUNITY)
Admission: EM | Admit: 2016-07-28 | Discharge: 2016-07-28 | Disposition: A | Discharge status: Home / Self Care | Payer: BLUE CROSS/BLUE SHIELD | Attending: Emergency Medicine | Admitting: Emergency Medicine

## 2016-07-28 ENCOUNTER — Telehealth: Payer: Self-pay | Admitting: Neurology

## 2016-07-28 ENCOUNTER — Encounter (HOSPITAL_COMMUNITY): Payer: Self-pay | Admitting: Emergency Medicine

## 2016-07-28 DIAGNOSIS — I10 Essential (primary) hypertension: Secondary | ICD-10-CM | POA: Diagnosis not present

## 2016-07-28 DIAGNOSIS — R569 Unspecified convulsions: Secondary | ICD-10-CM | POA: Insufficient documentation

## 2016-07-28 LAB — BASIC METABOLIC PANEL
Anion gap: 7 (ref 5–15)
BUN: 16 mg/dL (ref 6–20)
CHLORIDE: 107 mmol/L (ref 101–111)
CO2: 26 mmol/L (ref 22–32)
CREATININE: 0.84 mg/dL (ref 0.44–1.00)
Calcium: 8.8 mg/dL — ABNORMAL LOW (ref 8.9–10.3)
Glucose, Bld: 94 mg/dL (ref 65–99)
Potassium: 3.5 mmol/L (ref 3.5–5.1)
SODIUM: 140 mmol/L (ref 135–145)

## 2016-07-28 LAB — CBC WITH DIFFERENTIAL/PLATELET
BASOS PCT: 0 %
Basophils Absolute: 0 10*3/uL (ref 0.0–0.1)
EOS ABS: 0.1 10*3/uL (ref 0.0–0.7)
Eosinophils Relative: 1 %
HCT: 35.7 % — ABNORMAL LOW (ref 36.0–46.0)
HEMOGLOBIN: 11.6 g/dL — AB (ref 12.0–15.0)
Lymphocytes Relative: 37 %
Lymphs Abs: 2.5 10*3/uL (ref 0.7–4.0)
MCH: 29.7 pg (ref 26.0–34.0)
MCHC: 32.5 g/dL (ref 30.0–36.0)
MCV: 91.3 fL (ref 78.0–100.0)
MONOS PCT: 5 %
Monocytes Absolute: 0.4 10*3/uL (ref 0.1–1.0)
NEUTROS PCT: 57 %
Neutro Abs: 3.8 10*3/uL (ref 1.7–7.7)
Platelets: 226 10*3/uL (ref 150–400)
RBC: 3.91 MIL/uL (ref 3.87–5.11)
RDW: 13.4 % (ref 11.5–15.5)
WBC: 6.7 10*3/uL (ref 4.0–10.5)

## 2016-07-28 LAB — I-STAT BETA HCG BLOOD, ED (MC, WL, AP ONLY)

## 2016-07-28 LAB — MAGNESIUM: MAGNESIUM: 1.8 mg/dL (ref 1.7–2.4)

## 2016-07-28 MED ORDER — LEVETIRACETAM 1000 MG PO TABS: 1000.0000 mg | ORAL_TABLET | Freq: Two times a day (BID) | ORAL | 0 refills | Status: DC

## 2016-07-28 MED ORDER — KETOROLAC TROMETHAMINE 30 MG/ML IJ SOLN
30.0000 mg | Freq: Once | INTRAMUSCULAR | Status: AC
  Administered 2016-07-28: 30 mg via INTRAVENOUS
  Filled 2016-07-28: qty 1

## 2016-07-28 MED ORDER — SODIUM CHLORIDE 0.9 % IV SOLN
500.0000 mg | Freq: Once | INTRAVENOUS | Status: AC
  Administered 2016-07-28: 500 mg via INTRAVENOUS
  Filled 2016-07-28: qty 5

## 2016-07-28 MED ORDER — OXYCODONE HCL 5 MG PO TABS: 5.0000 mg | ORAL_TABLET | Freq: Two times a day (BID) | ORAL | 0 refills | Status: DC | PRN

## 2016-07-28 MED ORDER — TRAMADOL HCL 50 MG PO TABS: 50.0000 mg | ORAL_TABLET | Freq: Two times a day (BID) | ORAL | 0 refills | Status: DC | PRN

## 2016-07-28 MED ORDER — SODIUM CHLORIDE 0.9 % IV BOLUS (SEPSIS)
1000.0000 mL | Freq: Once | INTRAVENOUS | Status: AC
  Administered 2016-07-28: 1000 mL via INTRAVENOUS

## 2016-07-28 NOTE — ED Provider Notes (Signed)
[facility] DEPT Provider Note   CSN: [id number] Arrival date & time: 07/28/16  0216  By signing my name below, I, Evelene Croon, attest that this documentation has been prepared under the direction and in the presence of Everlene Balls, MD . Electronically Signed: Evelene Croon, Scribe. 07/28/2016. 2:32 AM.    History   Chief Complaint Chief Complaint  Patient presents with  . Seizures    The history is provided by the patient and the EMS personnel. No language interpreter was used.   HPI Comments:  Tammie [name] is a 40 y.o. female with a history of seizure who presents to the Emergency Department via EMS, s/p 2 witnessed seizures this AM each lasting ~ 2 minutes with 30 seconds in between, per EMS. Pt was post-ictal and diaphoretic upon EMS arrival. She is compliant with her seizure meds (Keppra); notes she has not had a seizure in a few months. Her only complaint at this time is pain to her tongue due to tongue bite. No recent vomiting, cough, urinary symptoms, or diarrhea. She also notes dental pain and states she has been taking BC powder with minimal relief.    Posey Pronto - Neurologist   Past Medical History:  Diagnosis Date  . Abnormal Pap smear   . Anemia   . Aortic root enlargement ([place]) 5/17   5.1cm thoracic aorta by echo  . Asthma    as child  . Esophageal reflux   . Fibroid   . Hypertension   . Hypopotassemia   . Obesity    s/p gastric sleeve  . Palpitations   . Paroxysmal atrial fibrillation (H[facility])   . Pregnancy induced hypertension   . Premature ventricular contraction   . Seizure [facility])     Patient Active Problem List   Diagnosis Date Noted  . NSVT (nonsustained ventricular tachycardia) ([place]) 03/25/2016  . Seizure ([place]) 03/06/2016  . Atrial fibrillation ([place])   . Essential hypertension 01/20/2016  . Faintness 01/20/2016  . Tooth fracture 11/08/2013  . OBESITY-MORBID (>100') 02/03/2009  . HYPERTENSION, BENIGN 02/03/2009    Past Surgical  History:  Procedure Laterality Date  . COLPOSCOPY    . [place] RESECTION  9/16   [facility]  . OOPHORECTOMY     Left  . TUBAL LIGATION      OB History    Gravida Para Term Preterm AB Living   2 2 2     2    SAB TAB Ectopic Multiple Live Births                   Home Medications    Prior to Admission medications   Medication Sig Start Date End Date Taking? Authorizing Provider  diltiazem (CARDIZEM) 30 MG tablet Take 30 mg by mouth 4 (four) times daily as needed.   Yes Historical Provider, MD  pantoprazole (PROTONIX) 20 MG tablet Take 1 tablet (20 mg total) by mouth daily. 05/07/16  Yes Orpah Greek, MD  levETIRAcetam (KEPPRA) 1000 MG tablet Take 1 tablet (1,000 mg total) by mouth 2 (two) times daily. 07/28/16   Everlene Balls, MD  oxyCODONE (ROXICODONE) 5 MG immediate release tablet Take 1 tablet (5 mg total) by mouth 2 (two) times daily as needed for severe pain. 07/28/16   Everlene Balls, MD    Family History Family History  Problem Relation Age of Onset  . Hypertension Father   . Kidney disease Father   . Cancer Father   . Diabetes Father   .  Diabetes Maternal Grandmother   . Hypertension Paternal Grandmother   . Epilepsy Cousin   . Epilepsy Maternal Aunt   . Epilepsy Maternal Uncle   . Febrile seizures Son   . Asthma Son     Social History Social History  Substance Use Topics  . Smoking status: Never Smoker  . Smokeless tobacco: Never Used  . Alcohol use 0.0 oz/week     Comment: drank last night; does not usually drink     Allergies   Sulfa antibiotics; Sulfamethoxazole; and Tape   Review of Systems Review of Systems  10 systems reviewed and all are negative for acute change except as noted in the HPI.   Physical Exam Updated Vital Signs BP 127/71 (BP Location: Right Arm)   Pulse 80   Temp 98 F (36.7 C) (Oral)   Resp 26   Ht 5\' 10"  (1.778 m)   Wt 243 lb (110.2 kg)   LMP 06/30/2016   SpO2 98%   BMI 34.87  kg/m   Physical Exam  Constitutional: She is oriented to person, place, and time. She appears well-developed and well-nourished. No distress.  HENT:  Head: Normocephalic and atraumatic.  Nose: Nose normal.  Mouth/Throat: Oropharynx is clear and moist. No oropharyngeal exudate.  Small bilateral tongue lacerations  No signs of oral infection   Eyes: Conjunctivae and EOM are normal. Pupils are equal, round, and reactive to light. No scleral icterus.  Neck: Normal range of motion. Neck supple. No JVD present. No tracheal deviation present. No thyromegaly present.  Cardiovascular: Normal rate, regular rhythm and normal heart sounds.  Exam reveals no gallop and no friction rub.   No murmur heard. Pulmonary/Chest: Effort normal and breath sounds normal. No respiratory distress. She has no wheezes. She exhibits no tenderness.  Abdominal: Soft. Bowel sounds are normal. She exhibits no distension and no mass. There is no tenderness. There is no rebound and no guarding.  Musculoskeletal: Normal range of motion. She exhibits no edema or tenderness.  Lymphadenopathy:    She has no cervical adenopathy.  Neurological: She is alert and oriented to person, place, and time. No cranial nerve deficit. She exhibits normal muscle tone.  Skin: Skin is warm and dry. No rash noted. No erythema. No pallor.  Nursing note and vitals reviewed.    ED Treatments / Results  DIAGNOSTIC STUDIES:  Oxygen Saturation is 97% on RA, normal by my interpretation.    COORDINATION OF CARE:  2:19 AM Discussed treatment plan with pt at bedside and pt agreed to plan.  Labs (all labs ordered are listed, but only abnormal results are displayed) Labs Reviewed  CBC WITH DIFFERENTIAL/PLATELET - Abnormal; Notable for the following:       Result Value   Hemoglobin 11.6 (*)    HCT 35.7 (*)    All other components within normal limits  BASIC METABOLIC PANEL - Abnormal; Notable for the following:    Calcium 8.8 (*)    All  other components within normal limits  MAGNESIUM  LEVETIRACETAM LEVEL  I-STAT BETA HCG BLOOD, ED ([facility], [facility], [facility] ONLY)  CBG MONITORING, ED    EKG  EKG Interpretation None       Radiology No results found.  Procedures Procedures (including critical care time)  Medications Ordered in ED Medications  sodium chloride 0.9 % bolus 1,000 mL (0 mLs Intravenous Stopped 07/28/16 0457)  ketorolac (TORADOL) 30 MG/ML injection 30 mg (30 mg Intravenous Given 07/28/16 0350)  levETIRAcetam (KEPPRA) 500 mg in sodium chloride  0.9 % 100 mL IVPB (0 mg Intravenous Stopped 07/28/16 0452)     Initial Impression / Assessment and Plan / ED Course  I have reviewed the triage vital signs and the nursing notes.  Pertinent labs & imaging results that were available during my care of the patient were reviewed by me and considered in my medical decision making (see chart for details).  Clinical Course    Patient presents to the ED for seizure despite compliance with her medications.  She denies exacerbating causes such as dehydration, infections etc. Labs are unremarkable.  Keppra level sent but it is a send out for future reference.  I spoke with Dr. Nicole Kindred with neurology who recommends for patient to increase keppra to 1000mg  BID and also for me to give 500mg  IV dose in the ED.  This was one and new Rx was given.  Neurology follow up advised within 3 days.  She demonstrates good understanding.  She appears well and in NAD.  She is back to her normal baseline.  Patient safe for DC.  Final Clinical Impressions(s) / ED Diagnoses   Final diagnoses:  Seizure ([place])    New Prescriptions New Prescriptions   LEVETIRACETAM (KEPPRA) 1000 MG TABLET    Take 1 tablet (1,000 mg total) by mouth 2 (two) times daily.   OXYCODONE (ROXICODONE) 5 MG IMMEDIATE RELEASE TABLET    Take 1 tablet (5 mg total) by mouth 2 (two) times daily as needed for severe pain.   I personally performed the services described in this  documentation, which was scribed in my presence. The recorded information has been reviewed and is accurate.       Everlene Balls, MD 07/28/16 [phone]

## 2016-07-28 NOTE — Telephone Encounter (Signed)
Tammie [name] 13-Apr-1976. She had a Seizure last night/ Early morning and had to go to the ER. She was told to follow up with Dr. Posey Pronto within the next 3 days. Her # is [phone]. Thank you

## 2016-07-28 NOTE — ED Triage Notes (Signed)
Pt arrives to [facility] at this time via [facility] stretcher. Per EMS report pt was postictal upon arrival.  Pt has a prior history of seizures.  Pt states that she is not certain if she took her seizure medication tonight.  EMS administered 4 mg zofran IVP PTA. Chief Complaint  Patient presents with  . Seizures   Past Medical History:  Diagnosis Date  . Abnormal Pap smear   . Anemia   . Aortic root enlargement ([place]) 5/17   5.1cm thoracic aorta by echo  . Asthma    as child  . Esophageal reflux   . Fibroid   . Hypertension   . Hypopotassemia   . Obesity    s/p gastric sleeve  . Palpitations   . Paroxysmal atrial fibrillation (HCC)   . Pregnancy induced hypertension   . Premature ventricular contraction   . Seizure ([place])

## 2016-07-28 NOTE — ED Notes (Signed)
Pt showing NAD. RR even and unlabored. Voices no questions/concerns at this time.

## 2016-07-29 ENCOUNTER — Telehealth: Payer: Self-pay | Admitting: Neurology

## 2016-07-29 NOTE — Telephone Encounter (Signed)
Tammie [name] June 24, 1976. She has called back to see about getting a sooner appointment. She is scheduled for January and on a wait list. Thank you

## 2016-07-30 LAB — LEVETIRACETAM LEVEL: Levetiracetam Lvl: 1.3 ug/mL — ABNORMAL LOW (ref 10.0–40.0)

## 2016-08-02 ENCOUNTER — Telehealth: Payer: Self-pay | Admitting: Internal Medicine

## 2016-08-02 ENCOUNTER — Telehealth: Payer: Self-pay | Admitting: Neurology

## 2016-08-02 NOTE — Telephone Encounter (Signed)
Left message on machine for pt to contact the office.   

## 2016-08-02 NOTE — Telephone Encounter (Signed)
Follow up   Pt verbalized that she wants to know if she can come in the office and wait for rn pertaining to the note for work  According to her HR her note has to be detailed on the restrictions, she has a letter that has guidelines, that has to be followed   The one provided stated no lifting but pt supervisor had her pushing and pulling up too 300lbs and it caused her to have seizures that night.   Pt said its overwhelming, I told her that I don't know how long she would have to wait, but if she calls for me from the front desk ill make the copy and bring to the rn

## 2016-08-02 NOTE — Telephone Encounter (Signed)
Pt came in today requesting paperwork to be filled out due to employment issues.  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  After staying after work pt had two seizures.  Stated Lori's note did not state pushing and pulling, only that pt could not lift over ten pounds.  This is out of [employer].  Lori called Dr. Everrett Coombe office and stated paperwork would have to be filled out by pt's surgeon.  Dr. Servando Snare in agreeance. Pt was given paperwork back and brought paperwork to Dr.Gerhardt's office.

## 2016-08-02 NOTE — Telephone Encounter (Signed)
New Message  Pt voiced she is pushing 300Ibs pts, but pts Human Resources wants MD to specify about what pt can and cannot do and about having surgery prior to coming back to work.  Please f/u

## 2016-08-02 NOTE — Telephone Encounter (Signed)
Patient coming in on Monday

## 2016-08-02 NOTE — Telephone Encounter (Signed)
See what it is she needs please - this has already been addressed in my note from last visit as well as a prior letter.

## 2016-08-02 NOTE — Telephone Encounter (Signed)
Patient wants to talk to someone about some paper work and other things she has appt on 08-08-16 at 8:00 [phone]

## 2016-08-02 NOTE — Telephone Encounter (Signed)
Patient notified that first appt is in Jan.  She is on wait list.

## 2016-08-03 NOTE — Telephone Encounter (Signed)
I spoke with patient and she will be dropping FMLA papers off today.

## 2016-08-08 ENCOUNTER — Other Ambulatory Visit: Payer: BLUE CROSS/BLUE SHIELD

## 2016-08-08 ENCOUNTER — Encounter: Payer: Self-pay | Admitting: Neurology

## 2016-08-08 ENCOUNTER — Ambulatory Visit (INDEPENDENT_AMBULATORY_CARE_PROVIDER_SITE_OTHER): Payer: BLUE CROSS/BLUE SHIELD | Admitting: Neurology

## 2016-08-08 VITALS — BP 140/80 | HR 80 | Ht 70.0 in | Wt 252.2 lb

## 2016-08-08 DIAGNOSIS — G40909 Epilepsy, unspecified, not intractable, without status epilepticus: Secondary | ICD-10-CM | POA: Diagnosis not present

## 2016-08-08 MED ORDER — LEVETIRACETAM 750 MG PO TABS: 750.0000 mg | ORAL_TABLET | Freq: Two times a day (BID) | ORAL | 5 refills | Status: DC

## 2016-08-08 NOTE — Patient Instructions (Addendum)
1.  Stressed the importance of taking Keppra 750mg  twice daily 2.  I will contact your cardiac team and inform them of the difficulty that you are having with your work restrictions  3.  Leave of work given for 30-days from a seizure stand point 4.  Check Keppra level 5.  No driving  Return to clinic in January

## 2016-08-08 NOTE — Progress Notes (Signed)
Follow-up Visit   Date: 08/08/16    JAVEAH VANCOURT MRN: [id number] DOB: [birth year]   Interim History: Tammie [name] is a 40 y.o. right-handed African American female with obesity s/p gastric sleeve with 57lb weight loss (October 2016) returning to the clinic for follow-up of seizure disorder.  The patient was accompanied to the clinic by mother.  History of present illness: She had her first seizure in April 2017.  Her sons had noticed that she was not awake by 10am which is very unusual for her so went to check on her and her eldest son found her in grunting in her bed and staring at him.  She was not responding to him and was very sleepy. Her does not recall seeing abnormal movements, but states that it took about 2 minutes for her to respond and answer questions.  She is amnestic of the event.  She did have urinary he reports wetting her bed and waking up with her tongue bleeding. She recalls having a lot of body aches and being confused. Her son called EMS where she was evaluated in the ED.  She had another spell on May 28th, where again, she was found by her mother unconscious in her bed, again with urinary incontinence, and woke up on a bloody pillow.  On event, she recalls having a racing heart rate.  Duration of these spells is unknown since it occurs at night.  She reports being extremely tired with generalized myalgias.  She was admitted to [facility] on 5/28 - 03/08/2016 for evaluation. When she presented to the ER, patient was somnolent and noted to be in atrial fibrillation, which self converted later that day. She underwent MRI brain which was normal and started on Keppra 500mg  twice daily.  She gets tired with Keppra, but it is less so than when she first started the medication.  She has not had any further spells.   She recalls having spells of staring for several years with reduced peripheral vision. She would get really tired and fatigued following these  spells.  There was no abnormal movements. She always attributed this to her PCP.   She reports taking verapamil three times daily for blood pressure but since loosing 57lb her blood pressure has normalized.  She feels that her blood pressure was getting too low at night time which may have triggered her seizures.   Seizure triggers include stress.  For the past two months, she has been working overtime as a Engineer, manufacturing.  UPDATE 05/31/2016:   Her last seizure occurred on July 14th in the setting of skipping her Keppra because she was in denial that she actually has seizures and needed medication.  Since being compliant with her medications, she has been seizure-free on Keppra 750mg  twice daily.  The medication makes her a little sleepy, but otherwise is tolerating it well.  Unfortunately, she has had a number of other medical comorbidities including newly found bicuspid aortic valave and dilated ascending aorta.  She has been restricted lifting no more than 25lb.   She is asking to see a counselor because she feels overwhelmed with all of her medical issues.  UPDATE 08/08/2016: Starting in the end of September, she self-tapered her medication to [place] 750mg  at bedtime only.  She says that the medication gives her insomnia and she feels fatigued all the time.  Upon further questioning, she also reports having a lot of anxiety with her new medical problems. I had sent  her to behavior therapy because of these complaints at her last visit and they recommended two-week out-patient program, but she was unable to do this as she was working.    In October, she had two back to back breakthrough seizures lasting 1-3 minutes.  Her mother called EMS who transferred her to the [facility] ER.  She did not disclose that she was noncomplaint with her medications, and was instructed to increase her Keppra to 1000mg  BID.  She has not been taking this dose and is still taking 750mg  at bedtime only.  She also  complains of two spells of amnesia.  Despite knowing that she should not be driving, she states that one two occasions, she did not know where she was going and ended up in [place].  There was no seizure-like activity.  Medications:  Current Outpatient Prescriptions on File Prior to Visit  Medication Sig Dispense Refill  . diltiazem (CARDIZEM) 30 MG tablet Take 30 mg by mouth 4 (four) times daily as needed.    Marland Kitchen oxyCODONE (ROXICODONE) 5 MG immediate release tablet Take 1 tablet (5 mg total) by mouth 2 (two) times daily as needed for severe pain. 6 tablet 0  . pantoprazole (PROTONIX) 20 MG tablet Take 1 tablet (20 mg total) by mouth daily. 30 tablet 0   No current facility-administered medications on file prior to visit.     Allergies:  Allergies  Allergen Reactions  . Sulfa Antibiotics Anaphylaxis, Rash and Other (See Comments)    "Burning"  . Sulfamethoxazole Anaphylaxis, Rash and Other (See Comments)    burning  . Tape Rash    Leads on heart monitor have caused a rash    Review of Systems:  CONSTITUTIONAL: No fevers, chills, night sweats, or weight loss.  EYES: No visual changes or eye pain ENT: No hearing changes.  No history of nose bleeds.   RESPIRATORY: No cough, wheezing and shortness of breath.   CARDIOVASCULAR: Negative for chest pain, and palpitations.   GI: Negative for abdominal discomfort, blood in stools or black stools.  No recent change in bowel habits.   GU:  No history of incontinence.   MUSCLOSKELETAL: No history of joint pain or swelling.  No myalgias.   SKIN: Negative for lesions, rash, and itching.   ENDOCRINE: Negative for cold or heat intolerance, polydipsia or goiter.   PSYCH:  + depression + anxiety symptoms.   NEURO: As Above.   Vital Signs:  BP 140/80   Pulse 80   Ht 5\' 10"  (1.778 m)   Wt 252 lb 4 oz (114.4 kg)   LMP 06/30/2016   SpO2 98%   BMI 36.19 kg/m    Neurological Exam: MENTAL STATUS including orientation to time, place, person,  recent and remote memory, attention span and concentration, language, and fund of knowledge is normal.  Speech is not dysarthric.  CRANIAL NERVES: Face is symmetric.   MOTOR:  Motor strength is 5/5 in all extremities.    COORDINATION/GAIT:   Gait narrow based and stable.   Data: MRI brain wo contrast 03/06/2016:  Motion degraded unenhanced examination without seizure focus identified.   Routine EEG 03/21/2016:  Normal  Lab Results  Component Value Date   ALT 8 07/18/2016   AST 11 07/18/2016   ALKPHOS 50 07/18/2016   BILITOT 0.5 07/18/2016     IMPRESSION/PLAN: 1.  Generalized seizure disorder, presenting with breakthrough seizures in the setting of medication non-compliance   - First seizure April 20017 and occurring once every  1-2 months, manifesting with LOC, tonic-clonic movements, tongue biting, incontinence, and postictal confusion and fatigue, occuring out of sleep or upon awakening  - MRI brain and routine EEG was normal  - Stressed the importance of medication compliance.  I will reduce her dose back to Keppra 750mg  BID since her seizures occurred when she was taking only 750mg /d.  - Check Keppra level  - No driving until seizure free for 26-months, last seizure 07/28/2016  - Because of her breakthrough seizure and her job responsibilities that require phlebotomy, I will keep her out of work for 30-days, but going forward, she may be able to return to work even sooner since none of her seizures occurring during the day  2.  She had many questions regarding the challenges she is experiencing at work with her employer meeting her physical limitations placed by cardiology such as lifting, pushing, and pulling.  She states that despite her notes stating she cannot lift > 20lb, they will make her push and pull wheelchairs/gurney's weighing up to 300lb.  She did receive and updated letter states her pushing and pulling limits, but her employer will not make accomodation for this and is  asking me to keep her on extended FMLA.  This is beyond the scope of my expertise and would be better addressed by Cardiology who is following her for dilated ascending aortic aneurysm.     Return to clinic in 2 months   The duration of this appointment visit was 40 minutes of face-to-face time with the patient.  Greater than 50% of this time was spent in counseling, explanation of diagnosis, planning of further management, and coordination of care.   Thank you for allowing me to participate in patient's care.  If I can answer any additional questions, I would be pleased to do so.    Sincerely,    Shantanu Strauch K. Posey Pronto, DO

## 2016-08-08 NOTE — Progress Notes (Signed)
Dr. Posey Pronto,  I am forwarding this message to her CT surgeon as well. She brought in papers that his office is completing.   Thanks Cecille Rubin

## 2016-08-11 LAB — LEVETIRACETAM LEVEL: KEPPRA (LEVETIRACETAM): 5.6 ug/mL

## 2016-08-12 ENCOUNTER — Telehealth: Payer: Self-pay | Admitting: Neurology

## 2016-08-12 NOTE — Telephone Encounter (Signed)
Form faxed. Patient notified.

## 2016-08-12 NOTE — Telephone Encounter (Signed)
Tammie [name] [birth year]. Her Insurance Psychologist, counselling) is asking to have her last Office Visit Notes faxed to them at [phone]. This is for her Short Term Disability. Her # is [phone]. Thank you

## 2016-08-25 ENCOUNTER — Encounter: Payer: Self-pay | Admitting: Cardiothoracic Surgery

## 2016-08-25 ENCOUNTER — Ambulatory Visit (INDEPENDENT_AMBULATORY_CARE_PROVIDER_SITE_OTHER): Payer: BLUE CROSS/BLUE SHIELD | Admitting: Cardiothoracic Surgery

## 2016-08-25 VITALS — BP 162/100 | HR 90 | Resp 20 | Ht 70.0 in | Wt 243.0 lb

## 2016-08-25 DIAGNOSIS — I712 Thoracic aortic aneurysm, without rupture: Secondary | ICD-10-CM | POA: Diagnosis not present

## 2016-08-25 DIAGNOSIS — I7121 Aneurysm of the ascending aorta, without rupture: Secondary | ICD-10-CM

## 2016-08-25 NOTE — Progress Notes (Signed)
[address]       [place] [address]             [phone]                    Tammie [name] Medical Record [id number] Date of Birth: 01/28/1976  Referring: Thompson Grayer, MD Primary Care: Octavio Graves, DO  Chief Complaint:    Chief Complaint  Patient presents with  . 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    History of Present Illness:    Tammie [name] 40 y.o. female was seen in the office for Incidental finding of dilated ascending aorta without associated bicuspid valve on echocardiogram. The patient notes that she was told she had an enlarged heart in her 31s. There is no documentation of the size of her aorta with any previous scans. Because of new onset seizures in April of this year and episodes of "fast heart rate" an echocardiogram was done which suggested a dilated  ascending aorta subsequently Dr. Rayann Heman ordered a MRI of the chest.     When I last saw the patient I suggested to her to her that it is "best to avoid activities that cause grunting or straining (medically referred to as a "valsalva maneuver"). This happens when a person bears down against a closed throat to increase the strength of arm or abdominal muscles. 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    The patient comes into the office today very frustrated because of attempts at her work to force her to continue to do heavy lifting.   Current Activity/ Functional Status:  Patient is independent with mobility/ambulation, transfers, ADL's, IADL's.   Zubrod  Score: At the time of surgery this patient's most appropriate activity status/level should be described as: []     0    Normal activity, no symptoms [x]     1    Restricted in physical strenuous activity but ambulatory, able to do out light work []     2    Ambulatory and capable of self care, unable to do work activities, up and about               >50 % of waking hours                              []     3    Only limited self care, in bed greater than 50% of waking hours []     4    Completely disabled, no self care, confined to bed or chair []     5    Moribund   Past Medical History:  Diagnosis Date  . Abnormal Pap smear   . Anemia   . Aortic root enlargement ([facility]) 5/17   5.1cm thoracic aorta by echo  . Asthma    as child  . Esophageal reflux   . Fibroid   . Hypertension   . Hypopotassemia   . Obesity  s/p gastric sleeve  . Palpitations   . Paroxysmal atrial fibrillation (HCC)   . Pregnancy induced hypertension   . Premature ventricular contraction   . Seizure [facility])     Past Surgical History:  Procedure Laterality Date  . COLPOSCOPY    . [place] RESECTION  9/16   [facility]  . OOPHORECTOMY     Left  . TUBAL LIGATION      Family History  Problem Relation Age of Onset  . Hypertension Father   . Kidney disease Father   . Cancer Father   . Diabetes Father   . Diabetes Maternal Grandmother   . Hypertension Paternal Grandmother   . Epilepsy Cousin   . Epilepsy Maternal Aunt   . Epilepsy Maternal Uncle   . Febrile seizures Son   . Asthma Son   Patient's mother has no history of heart disease father is deceased at age 17 of esophageal cancer one grandmother had a myocardial infarction at age 40 , t paternal grandmother died of a stroke one of her father's cousins died so suddenly and Coumadin H 36 unknown cause  Social History   Social History  . Marital Status: Single    Spouse Name: N/A  . Number of Children: N/A  . Years of  Education: N/A   Occupational History  . 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 Smoking status: Never Smoker   . Smokeless tobacco: Never Used  . Alcohol Use: 0.0 oz/week    0 Standard drinks or equivalent per week     Comment: drank last night; does not usually drink  . Drug Use: No  . Sexual Activity: Yes    Birth Control/ Protection: Condom, Surgical   Other Topics Concern  . Not on file   Social History Narrative   Lives with children in an apartment on the second floor.  Has 2 sons.  Works as Chief Strategy Officer.  Education: 2 1/2 years of college.      History  Smoking Status  . Never Smoker  Smokeless Tobacco  . Never Used    History  Alcohol Use  . 0.0 oz/week    Comment: drank last night; does not usually drink     Allergies  Allergen Reactions  . Sulfa Antibiotics Anaphylaxis, Rash and Other (See Comments)    "Burning"  . Sulfamethoxazole Anaphylaxis, Rash and Other (See Comments)    burning  . Tape Rash    Leads on heart monitor have caused a rash    Current Outpatient Prescriptions  Medication Sig Dispense Refill  . diltiazem (CARDIZEM) 30 MG tablet Take 30 mg by mouth 4 (four) times daily as needed.    . levETIRAcetam (KEPPRA) 750 MG tablet Take 1 tablet (750 mg total) by mouth 2 (two) times daily. 60 tablet 5  . oxyCODONE (ROXICODONE) 5 MG immediate release tablet Take 1 tablet (5 mg total) by mouth 2 (two) times daily as needed for severe pain. 6 tablet 0  . pantoprazole (PROTONIX) 20 MG tablet Take 1 tablet (20 mg total) by mouth daily. 30 tablet 0   No current facility-administered medications for this visit.       Review of Systems:     Cardiac Review of Systems: Y or N  Chest Pain Florencio.Farrier    ]  Resting SOB [  n ] Exertional SOB  [ n ]  Orthopnea [ n ]  Pedal Edema [ n  ]    Palpitations Blue.Reese  ] Syncope  [ y ]   Presyncope [  n ]  General Review of Systems: [Y] = yes [   ]=no Constitional: recent weight change [  ];  Wt loss over the last 3 months [   ] anorexia [  ]; fatigue [  ]; nausea [  ]; night sweats [  ]; fever [  ]; or chills [  ];          Dental: poor dentition[  ]; Last Dentist visit:   Eye : blurred vision [  ]; diplopia [   ]; vision changes [  ];  Amaurosis fugax[  ]; Resp: cough [  ];  wheezing[  ];  hemoptysis[  ]; shortness of breath[  ]; paroxysmal nocturnal dyspnea[  ]; dyspnea on exertion[  ]; or orthopnea[  ];  GI:  gallstones[  ], vomiting[  ];  dysphagia[  ]; melena[  ];  hematochezia [  ]; heartburn[  ];   Hx of  Colonoscopy[  ]; GU: kidney stones [  ]; hematuria[  ];   dysuria [  ];  nocturia[  ];  history of     obstruction [  ]; urinary frequency [  ]             Skin: rash, swelling[  ];, hair loss[  ];  peripheral edema[  ];  or itching[  ]; Musculosketetal: myalgias[  ];  joint swelling[  ];  joint erythema[  ];  joint pain[  ];  back pain[  ];  Heme/Lymph: bruising[  ];  bleeding[  ];  anemia[  ];  Neuro: TIA[  ];  headaches[  ];  stroke[  ];  vertigo[  ];  seizures[ y ];   paresthesias[  ];  difficulty walking[  ];  Psych:depression[  ]; anxiety[  ];  Endocrine: diabetes[  ];  thyroid dysfunction[  ];  Immunizations: Flu up to date Blue.Reese  ]; Pneumococcal up to date Blue.Reese ];  Other:  Physical Exam: BP (!) 162/100 (BP Location: Right Arm, Patient Position: Sitting, Cuff Size: Large)   Pulse 90   Resp 20   Ht 5\' 10"  (1.778 m)   Wt 243 lb (110.2 kg)   LMP 06/30/2016   SpO2 98% Comment: RA  BMI 34.87 kg/m   PHYSICAL EXAMINATION: General appearance: alert, cooperative and morbidly obese Head: Normocephalic, without obvious abnormality, atraumatic Neck: no adenopathy, no carotid bruit, no JVD, supple, symmetrical, trachea midline and thyroid not enlarged, symmetric, no tenderness/mass/nodules Lymph nodes: Cervical, supraclavicular, and axillary nodes normal. Resp: clear to auscultation bilaterally Back: symmetric, no curvature.  ROM normal. No CVA tenderness. Cardio: regular rate and rhythm, S1, S2 normal, no murmur, click, rub or gallop GI: soft, non-tender; bowel sounds normal; no masses,  no organomegaly Extremities: extremities normal, atraumatic, no cyanosis or edema and Homans sign is negative, no sign of DVT Neurologic: Grossly normal  Diagnostic Studies & Laboratory data:     Recent Radiology Findings:   No results found.I have independently reviewed the above radiology studies  and reviewed the findings with the patient.  ECHO 02/2016 Study Conclusions  - Left ventricle: The cavity size was normal. Wall thickness was  normal. Systolic function was normal. The estimated ejection  fraction was in the range of 50% to 55%. Wall motion was normal;  there were no regional wall motion abnormalities. Features are  consistent with a pseudonormal left  ventricular filling pattern,  with concomitant abnormal relaxation and increased filling  pressure (grade 2 diastolic dysfunction). - Left atrium: The atrium was mildly dilated.  Impressions:  - Low normal LV systolic function; grade 2 diastolic dysfunction;  mild LAE; mild TR; severely dilated ascending aorta (51 mm);  suggest CTA or MRA to further assess.  Transthoracic echocardiography. M-mode, complete 2D, spectral Doppler, and color Doppler. Birthdate: Patient birthdate: Apr 27, 1976. Age: Patient is 40 yr old. Sex: Gender: female. BMI: 34.3 kg/m^2. Blood pressure: 133/72 Patient status: Inpatient. Study date: Study date: 03/07/2016. Study time: 04:35 PM. Location: Bedside.  -------------------------------------------------------------------  ------------------------------------------------------------------- Left ventricle: The cavity size was normal. Wall thickness was normal. Systolic function was normal. The estimated ejection fraction was in the range of 50% to 55%. Wall motion was normal; there were no regional  wall motion abnormalities. Features are consistent with a pseudonormal left ventricular filling pattern, with concomitant abnormal relaxation and increased filling pressure (grade 2 diastolic dysfunction).  ------------------------------------------------------------------- Aortic valve: Trileaflet; normal thickness leaflets. Mobility was not restricted. Doppler: Transvalvular velocity was within the normal range. There was no stenosis. There was no regurgitation.  ------------------------------------------------------------------- Aorta: Aortic root: The aortic root was normal in size.  ------------------------------------------------------------------- Mitral valve: Structurally normal valve. Mobility was not restricted. Doppler: Transvalvular velocity was within the normal range. There was no evidence for stenosis. There was no regurgitation. Peak gradient (D): 3 mm Hg.  ------------------------------------------------------------------- Left atrium: The atrium was mildly dilated.  ------------------------------------------------------------------- Right ventricle: The cavity size was normal. Systolic function was normal.  ------------------------------------------------------------------- Pulmonic valve: Doppler: Transvalvular velocity was within the normal range. There was no evidence for stenosis.  ------------------------------------------------------------------- Tricuspid valve: Structurally normal valve. Doppler: Transvalvular velocity was within the normal range. There was mild regurgitation.  ------------------------------------------------------------------- Right atrium: The atrium was normal in size.  ------------------------------------------------------------------- Pericardium: There was no pericardial effusion.  ------------------------------------------------------------------- Systemic veins: Inferior vena cava: The  vessel was normal in size.  Recent Lab Findings: Lab Results  Component Value Date   WBC 6.7 07/28/2016   HGB 11.6 (L) 07/28/2016   HCT 35.7 (L) 07/28/2016   PLT 226 07/28/2016   GLUCOSE 94 07/28/2016   CHOL 183 06/18/2013   TRIG 75.0 06/18/2013   HDL 42.60 06/18/2013   LDLCALC 125 (H) 06/18/2013   ALT 8 07/18/2016   AST 11 07/18/2016   NA 140 07/28/2016   K 3.5 07/28/2016   CL 107 07/28/2016   CREATININE 0.84 07/28/2016   BUN 16 07/28/2016   CO2 26 07/28/2016   TSH 1.52 07/18/2016   HGBA1C 4.7 11/16/2011   Aortic Size Index=   4.8       /Body surface area is 2.33 meters squared. 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 Rayann Heman Obesity- status post sleeve gastroplasty  I reviewed carefully with the patientthe radiographic findings of dilated ascending aorta, I had recommended follow-up CTA scan in 6 months which is scheduled for January. The patient has specifically been warned to avoid  strenuous lifting over 25 pounds, avoid pregnancy, avoid hypertension, avoid any legal or illegal drugs that may cause hypertension. Signs and symptoms of aortic dissection have been reviewed with her carefully.  I have again reviewed with the patient and put on her work papers the standard recommendation about strenuous lifting and Valsalva from the nature concerning aortic dissection.     Grace Isaac MD      [address] [place] [address] Office [phone]   Beeper [phone]  08/25/2016 3:39 PM

## 2016-09-06 ENCOUNTER — Encounter: Payer: Self-pay | Admitting: *Deleted

## 2016-09-06 ENCOUNTER — Other Ambulatory Visit: Payer: Self-pay | Admitting: *Deleted

## 2016-09-06 ENCOUNTER — Telehealth: Payer: Self-pay | Admitting: Neurology

## 2016-09-06 MED ORDER — MAGIC MOUTHWASH: 30.0000 mL | Freq: Three times a day (TID) | ORAL | 2 refills | Status: AC

## 2016-09-06 MED ORDER — LEVETIRACETAM 1000 MG PO TABS: 1000.0000 mg | ORAL_TABLET | Freq: Two times a day (BID) | ORAL | 2 refills | Status: DC

## 2016-09-06 NOTE — Telephone Encounter (Signed)
Yes, magic mouthwash with lidocaine.  With her breakthrough seizure, recommend she increase Keppra to 1000mg  BID.  Vertis Bauder K. Posey Pronto, DO

## 2016-09-06 NOTE — Telephone Encounter (Signed)
Patient given instructions and agreed with plan.  Rx sent in.  Note for missing work tomorrow left up front.

## 2016-09-06 NOTE — Telephone Encounter (Signed)
Tammie [name]  01-21-1976. Her # [phone]. Had a really bad seizure this morning. Her mother called in this morning to say they found her on the floor. She had bitten her tongue and lip really bad. They were wondering was there something she could use for the bite? Her mother said there were some changes that were made with her medication the last time she was here.  Thank you

## 2016-09-06 NOTE — Telephone Encounter (Signed)
Can I call in some more mouthwash if she has no refills or do you recommend something else?

## 2016-09-07 ENCOUNTER — Telehealth: Payer: Self-pay | Admitting: Neurology

## 2016-09-07 NOTE — Telephone Encounter (Signed)
Called Helen back and line was still busy.

## 2016-09-07 NOTE — Telephone Encounter (Signed)
PT's mother Bonnita Nasuti called and wanted to talk to Dr Posey Pronto about a seizure she had/Dawn CB# [phone]

## 2016-09-07 NOTE — Telephone Encounter (Signed)
Attempted to contact Tammie [name] at number listed but it is the wrong number.  Called the number in emergency contact list and line was busy.

## 2016-09-16 ENCOUNTER — Telehealth: Payer: Self-pay | Admitting: Neurology

## 2016-09-16 NOTE — Telephone Encounter (Signed)
Mom brought in form and Dr. Posey Pronto filled out and signed.

## 2016-09-16 NOTE — Telephone Encounter (Signed)
PT's mother Bonnita Nasuti called and would like a call back regarding her daughter/Dawn CB# [phone]

## 2016-09-27 ENCOUNTER — Other Ambulatory Visit: Payer: Self-pay | Admitting: Cardiothoracic Surgery

## 2016-09-27 DIAGNOSIS — I712 Thoracic aortic aneurysm, without rupture, unspecified: Secondary | ICD-10-CM

## 2016-10-24 ENCOUNTER — Encounter: Payer: Self-pay | Admitting: Neurology

## 2016-10-24 ENCOUNTER — Ambulatory Visit (INDEPENDENT_AMBULATORY_CARE_PROVIDER_SITE_OTHER): Payer: 59 | Admitting: Neurology

## 2016-10-24 ENCOUNTER — Other Ambulatory Visit: Payer: 59

## 2016-10-24 VITALS — BP 120/80 | HR 82 | Ht 70.0 in | Wt 260.4 lb

## 2016-10-24 DIAGNOSIS — G40909 Epilepsy, unspecified, not intractable, without status epilepticus: Secondary | ICD-10-CM

## 2016-10-24 MED ORDER — LEVETIRACETAM ER 750 MG PO TB24: 2250.0000 mg | ORAL_TABLET | Freq: Every day | ORAL | 5 refills | Status: DC

## 2016-10-24 NOTE — Patient Instructions (Addendum)
1.  Start Keppra ER 750mg  - take 3 tablets at bedtime daily 2.  Check Keppra level 3.  No driving for 6 months from last seizure  Return to clinic in 4 months

## 2016-10-24 NOTE — Progress Notes (Signed)
Follow-up Visit   Date: 10/24/16    Tammie [name] MRN: [id number] DOB: 13-Sep-1976   Interim History: Tammie [name] is a 41 y.o. right-handed African American female with obesity s/p gastric sleeve with 57lb weight loss (October 2016) returning to the clinic for follow-up of seizure disorder.  The patient was accompanied to the clinic by mother.  History of present illness: She had her first seizure in April 2017.  Her sons had noticed that she was not awake by 10am which is very unusual for her so went to check on her and her eldest son found her in grunting in her bed and staring at him.  She was not responding to him and was very sleepy. Her does not recall seeing abnormal movements, but states that it took about 2 minutes for her to respond and answer questions.  She is amnestic of the event.  She did have urinary he reports wetting her bed and waking up with her tongue bleeding. She recalls having a lot of body aches and being confused. Her son called EMS where she was evaluated in the ED.  She had another spell on May 28th, where again, she was found by her mother unconscious in her bed, again with urinary incontinence, and woke up on a bloody pillow.  On event, she recalls having a racing heart rate.  Duration of these spells is unknown since it occurs at night.  She reports being extremely tired with generalized myalgias.  She was admitted to [facility] on 5/28 - 03/08/2016 for evaluation. When she presented to the ER, patient was somnolent and noted to be in atrial fibrillation, which self converted later that day. She underwent MRI brain which was normal and started on Keppra 500mg  twice daily.  She gets tired with Keppra, but it is less so than when she first started the medication.  She has not had any further spells.   She recalls having spells of staring for several years with reduced peripheral vision. She would get really tired and fatigued following these  spells.  There was no abnormal movements. She always attributed this to her PCP.   She reports taking verapamil three times daily for blood pressure but since loosing 57lb her blood pressure has normalized.  She feels that her blood pressure was getting too low at night time which may have triggered her seizures.   Seizure triggers include stress.  For the past two months, she has been working overtime as a Engineer, manufacturing.  UPDATE 05/31/2016:   Her last seizure occurred on July 14th in the setting of skipping her Keppra because she was in denial that she actually has seizures and needed medication.  Since being compliant with her medications, she has been seizure-free on Keppra 750mg  twice daily.  The medication makes her a little sleepy, but otherwise is tolerating it well.  Unfortunately, she has had a number of other medical comorbidities including newly found bicuspid aortic valave and dilated ascending aorta.  She has been restricted lifting no more than 25lb.   She is asking to see a counselor because she feels overwhelmed with all of her medical issues.  UPDATE 08/08/2016: Starting in the end of September, she self-tapered her medication to [place] 750mg  at bedtime only.  She says that the medication gives her insomnia and she feels fatigued all the time.  Upon further questioning, she also reports having a lot of anxiety with her new medical problems. I had sent  her to behavior therapy because of these complaints at her last visit and they recommended two-week out-patient program, but she was unable to do this as she was working.    In October, she had two back to back breakthrough seizures lasting 1-3 minutes.  Her mother called EMS who transferred her to the [facility] ER.  She did not disclose that she was noncomplaint with her medications, and was instructed to increase her Keppra to 1000mg  BID.  She has not been taking this dose and is still taking 750mg  at bedtime only.  She also  complains of two spells of amnesia.  Despite knowing that she should not be driving, she states that one two occasions, she did not know where she was going and ended up in [place].  There was no seizure-like activity.  UPDATE 10/24/2016:   She reports to being compliant with Keppra 1000mg  twice daily, but reports to having 3-4 seizures despite this.  She does not want to take any medications and is not happy with taking Keppra.  She continues to feel that she is having seizures because of her AAA.  She is no longer working.  She appears very depressed and does not engage in conversations.   Medications:  Current Outpatient Prescriptions on File Prior to Visit  Medication Sig Dispense Refill  . diltiazem (CARDIZEM) 30 MG tablet Take 30 mg by mouth 4 (four) times daily as needed.    . magic mouthwash SOLN Take 30 mLs by mouth 3 (three) times daily. 630 mL 2  . oxyCODONE (ROXICODONE) 5 MG immediate release tablet Take 1 tablet (5 mg total) by mouth 2 (two) times daily as needed for severe pain. 6 tablet 0  . pantoprazole (PROTONIX) 20 MG tablet Take 1 tablet (20 mg total) by mouth daily. 30 tablet 0   No current facility-administered medications on file prior to visit.     Allergies:  Allergies  Allergen Reactions  . Sulfa Antibiotics Anaphylaxis, Rash and Other (See Comments)    "Burning"  . Sulfamethoxazole Anaphylaxis, Rash and Other (See Comments)    burning  . Tape Rash    Leads on heart monitor have caused a rash    Review of Systems:  CONSTITUTIONAL: No fevers, chills, night sweats, or weight loss.  EYES: No visual changes or eye pain ENT: No hearing changes.  No history of nose bleeds.   RESPIRATORY: No cough, wheezing and shortness of breath.   CARDIOVASCULAR: Negative for chest pain, and palpitations.   GI: Negative for abdominal discomfort, blood in stools or black stools.  No recent change in bowel habits.   GU:  No history of incontinence.   MUSCLOSKELETAL: No history  of joint pain or swelling.  No myalgias.   SKIN: Negative for lesions, rash, and itching.   ENDOCRINE: Negative for cold or heat intolerance, polydipsia or goiter.   PSYCH:  + depression + anxiety symptoms.   NEURO: As Above.   Vital Signs:  BP 120/80   Pulse 82   Ht 5\' 10"  (1.778 m)   Wt 260 lb 7 oz (118.1 kg)   SpO2 97%   BMI 37.37 kg/m   Neurological Exam: Formal neurological exam deferred.  She appears depressed.  Face is symmetric.  She is antigravity in all extremities.    Data:   MRI brain wo contrast 03/06/2016:  Motion degraded unenhanced examination without seizure focus identified.   Routine EEG 03/21/2016:  Normal  Lab Results  Component Value Date  ALT 8 07/18/2016   AST 11 07/18/2016   ALKPHOS 50 07/18/2016   BILITOT 0.5 07/18/2016     IMPRESSION/PLAN: Generalized seizure disorder, presenting with breakthrough seizures.   - First seizure April 20017 and occurring once every 1-2 months, manifesting with LOC, tonic-clonic movements, tongue biting, incontinence, and postictal confusion and fatigue, occuring out of sleep or upon awakening  - MRI brain and routine EEG was normal  - Stressed the importance of medication compliance as she previously was having increased frequency of seizures due to self tapering her Keppra   - She complains of insomnia with the Keppra and she has strong desire simply not to take medications. Again, I stressed the importance of seizure management and medication compliance. I offered to switch her to an alternative medication such as lamictal or transition to [place] ER, she prefers neither but is agreeable to switching to extended realease Keppra  - Start Keppra ER 2250mg /d - take (3) 750mg  tab daily to see if helps with adverse effects.  - Check Keppra level  - If she continues to have seizures, she will need ambulatory EEG to be sure these are truly epileptic seizures and not convulsive syncope.  My suspicion for the latter is very low.    It was explained that I do not think that her seizures are stemming from her AAA.  - No driving until seizure free for 58-months  Return to clinic in 4 months  The duration of this appointment visit was 20 minutes of face-to-face time with the patient.  Greater than 50% of this time was spent in counseling, explanation of diagnosis, planning of further management, and coordination of care.   Thank you for allowing me to participate in patient's care.  If I can answer any additional questions, I would be pleased to do so.    Sincerely,    Magalie Almon K. Posey Pronto, DO

## 2016-10-25 ENCOUNTER — Other Ambulatory Visit: Payer: Self-pay | Admitting: *Deleted

## 2016-10-25 DIAGNOSIS — F419 Anxiety disorder, unspecified: Secondary | ICD-10-CM

## 2016-10-25 MED ORDER — ALPRAZOLAM 0.25 MG PO TABS: 0.2500 mg | ORAL_TABLET | Freq: Once | ORAL | 0 refills | Status: AC

## 2016-10-26 LAB — LEVETIRACETAM LEVEL: Keppra (Levetiracetam): 7.1 ug/mL

## 2016-10-27 ENCOUNTER — Other Ambulatory Visit: Payer: Self-pay

## 2016-10-27 ENCOUNTER — Ambulatory Visit: Payer: 59 | Admitting: Cardiothoracic Surgery

## 2016-10-28 ENCOUNTER — Ambulatory Visit
Admission: RE | Admit: 2016-10-28 | Discharge: 2016-10-28 | Disposition: A | Discharge status: Home / Self Care | Payer: 59 | Source: Ambulatory Visit | Attending: Cardiothoracic Surgery | Admitting: Cardiothoracic Surgery

## 2016-10-28 DIAGNOSIS — I712 Thoracic aortic aneurysm, without rupture, unspecified: Secondary | ICD-10-CM

## 2016-10-28 MED ORDER — IOPAMIDOL (ISOVUE-370) INJECTION 76%
75.0000 mL | Freq: Once | INTRAVENOUS | Status: AC | PRN
  Administered 2016-10-28: 75 mL via INTRAVENOUS

## 2016-10-31 ENCOUNTER — Encounter: Payer: Self-pay | Admitting: Cardiothoracic Surgery

## 2016-10-31 ENCOUNTER — Ambulatory Visit (INDEPENDENT_AMBULATORY_CARE_PROVIDER_SITE_OTHER): Payer: 59 | Admitting: Cardiothoracic Surgery

## 2016-10-31 VITALS — BP 140/90 | HR 79 | Resp 20 | Ht 70.0 in | Wt 255.0 lb

## 2016-10-31 DIAGNOSIS — I712 Thoracic aortic aneurysm, without rupture: Secondary | ICD-10-CM | POA: Diagnosis not present

## 2016-10-31 DIAGNOSIS — I7121 Aneurysm of the ascending aorta, without rupture: Secondary | ICD-10-CM

## 2016-10-31 NOTE — Progress Notes (Signed)
[address]       [place] [address]             [phone]                    Jenny M Warshawsky [facility] Medical Record [id number] Date of Birth: [birth year]  Referring: Thompson Grayer, MD Primary Care: Octavio Graves, DO  Chief Complaint:    Chief Complaint  Patient presents with  . Thoracic Aortic Aneurysm    6 month f/u with CTA Chest    History of Present Illness:    Tammie [name] 41 y.o. female was seen in the office for Incidental finding of dilated ascending aorta without associated bicuspid valve on echocardiogram., first seen in July 2017.  The patient notes that she was told she had an enlarged heart in her 30s. There is no documentation of the size of her aorta with any previous scans. Because of new onset seizures in April of this year and episodes of "fast heart rate" an echocardiogram was done which suggested a dilated  ascending aorta subsequently Dr. Rayann Heman ordered a MRI of the chest. Repeat Ct of chest done 04/2016 and last week.    Patient has had seizures , current on seizure mets.  I have cautioned her about the risks of pregnancy, heavy lifting and hypertension.  She has no family history of aortic dissection , or sudden death in close relative at early age. A cousin of her father died suddenly at age 67  Current Activity/ Functional Status:  Patient is independent with mobility/ambulation, transfers, ADL's, IADL's.   Zubrod Score: At the time of surgery this patient's most appropriate activity status/level should be described as: []     0    Normal activity, no symptoms [x]     1    Restricted in physical strenuous activity but ambulatory, able to do out light work []     2    Ambulatory and capable of self care, unable to do work activities, up and about               >50 % of waking hours                              []     3    Only limited self care, in bed greater than 50% of waking hours []     4    Completely disabled,  no self care, confined to bed or chair []     5    Moribund   Past Medical History:  Diagnosis Date  . Abnormal Pap smear   . Anemia   . Aortic root enlargement ([place]) 5/17   5.1cm thoracic aorta by echo  . Asthma    as child  . Esophageal reflux   . Fibroid   . Hypertension   . Hypopotassemia   . Obesity    s/p gastric sleeve  . Palpitations   . Paroxysmal atrial fibrillation (HCC)   . Pregnancy induced hypertension   . Premature ventricular contraction   . Seizure [facility])     Past Surgical History:  Procedure Laterality Date  . COLPOSCOPY    . [place] RESECTION  9/16   [facility]  . OOPHORECTOMY     Left  . TUBAL LIGATION      Family History  Problem Relation Age of Onset  . Hypertension  Father   . Kidney disease Father   . Cancer Father   . Diabetes Father   . Diabetes Maternal Grandmother   . Hypertension Paternal Grandmother   . Epilepsy Cousin   . Epilepsy Maternal Aunt   . Epilepsy Maternal Uncle   . Febrile seizures Son   . Asthma Son   Patient's mother has no history of heart disease father is deceased at age 58 of esophageal cancer one grandmother had a myocardial infarction at age 90 , t paternal grandmother died of a stroke one of her father's cousins died  suddenly and Coumadin  69 unknown cause  Social History   Social History  . Marital Status: Single    Spouse Name: N/A  . Number of Children: N/A  . Years of Education: N/A   Occupational History  . 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 Smoking status: Never Smoker   . Smokeless tobacco: Never Used  . Alcohol Use: 0.0 oz/week    0 Standard drinks or equivalent per week     Comment: drank last night; does not usually drink  . Drug Use: No  . Sexual Activity: Yes    Birth Control/ Protection: Condom, Surgical   Other Topics Concern  . Not on file   Social History Narrative    Lives with children in an apartment on the second floor.  Has 2 sons.  Works as Chief Strategy Officer.  Education: 2 1/2 years of college.      History  Smoking Status  . Never Smoker  Smokeless Tobacco  . Never Used    History  Alcohol Use  . 0.0 oz/week    Comment: drank last night; does not usually drink     Allergies  Allergen Reactions  . Sulfa Antibiotics Anaphylaxis, Rash and Other (See Comments)    "Burning"  . Sulfamethoxazole Anaphylaxis, Rash and Other (See Comments)    burning  . Tape Rash    Leads on heart monitor have caused a rash    Current Outpatient Prescriptions  Medication Sig Dispense Refill  . diltiazem (CARDIZEM) 30 MG tablet Take 30 mg by mouth 4 (four) times daily as needed.    . levETIRAcetam 750 MG TB24 Take 3 tablets (2,250 mg total) by mouth daily. 90 tablet 5  . magic mouthwash SOLN Take 30 mLs by mouth 3 (three) times daily. 630 mL 2   No current facility-administered medications for this visit.       Review of Systems:     Cardiac Review of Systems: Y or N  Chest Pain Florencio.Farrier    ]  Resting SOB [  n ] Exertional SOB  [ n ]  Orthopnea [ n ]   Pedal Edema [ n  ]    Palpitations Blue.Reese  ] Syncope  [ y ]   Presyncope [  n ]  General Review of Systems: [Y] = yes [  ]=no Constitional: recent weight change [  ];  Wt loss over the last 3 months [   ] anorexia [  ]; fatigue [  ]; nausea [  ]; night sweats [  ]; fever [  ]; or chills [  ];          Dental: poor dentition[  ]; Last Dentist visit:   Eye : blurred vision [  ]; diplopia [   ]; vision changes [  ];  Amaurosis fugax[  ];  Resp: cough [  ];  wheezing[  ];  hemoptysis[  ]; shortness of breath[  ]; paroxysmal nocturnal dyspnea[  ]; dyspnea on exertion[  ]; or orthopnea[  ];  GI:  gallstones[  ], vomiting[  ];  dysphagia[  ]; melena[  ];  hematochezia [  ]; heartburn[  ];   Hx of  Colonoscopy[  ]; GU: kidney stones [  ]; hematuria[  ];   dysuria [  ];  nocturia[  ];  history of     obstruction [  ]; urinary frequency [   ]             Skin: rash, swelling[  ];, hair loss[  ];  peripheral edema[  ];  or itching[  ]; Musculosketetal: myalgias[  ];  joint swelling[  ];  joint erythema[  ];  joint pain[  ];  back pain[  ];  Heme/Lymph: bruising[  ];  bleeding[  ];  anemia[  ];  Neuro: TIA[  ];  headaches[  ];  stroke[  ];  vertigo[  ];  seizures[ y ];   paresthesias[  ];  difficulty walking[  ];  Psych:depression[  ]; anxiety[  ];  Endocrine: diabetes[  ];  thyroid dysfunction[  ];  Immunizations: Flu up to date Blue.Reese  ]; Pneumococcal up to date Blue.Reese ];  Other:  Physical Exam: BP 140/90   Pulse 79   Resp 20   Ht 5\' 10"  (1.778 m)   Wt 255 lb (115.7 kg)   SpO2 98% Comment: RA  BMI 36.59 kg/m   PHYSICAL EXAMINATION: General appearance: alert, cooperative and morbidly obese Head: Normocephalic, without obvious abnormality, atraumatic Neck: no adenopathy, no carotid bruit, no JVD, supple, symmetrical, trachea midline and thyroid not enlarged, symmetric, no tenderness/mass/nodules Lymph nodes: Cervical, supraclavicular, and axillary nodes normal. Resp: clear to auscultation bilaterally Back: symmetric, no curvature. ROM normal. No CVA tenderness. Cardio: regular rate and rhythm, S1, S2 normal, no murmur, click, rub or gallop GI: soft, non-tender; bowel sounds normal; no masses,  no organomegaly Extremities: extremities normal, atraumatic, no cyanosis or edema and Homans sign is negative, no sign of DVT Neurologic: Grossly normal  Diagnostic Studies & Laboratory data:     Recent Radiology Findings:   Ct Angio Chest Aorta W/cm &/or Wo/cm  Result Date: 10/28/2016 CLINICAL DATA:  Thoracic aortic aneurysm without rupture. EXAM: CT ANGIOGRAPHY CHEST WITH CONTRAST TECHNIQUE: Multidetector CT imaging of the chest was performed using the standard protocol during bolus administration of intravenous contrast. Multiplanar CT image reconstructions and MIPs were obtained to evaluate the vascular anatomy. CONTRAST:  75 mL  Isovue 370 COMPARISON:  CTA of the chest 05/07/2016. MRA of the chest 04/13/2016 FINDINGS: Cardiovascular: The heart is mildly enlarged. An ascending aortic aneurysm is stable in size 4.8 cm. A 3 vessel arch configuration is present. There is some atherosclerotic disease within the descending aorta without a discrete aneurysm. The maximum transverse diameter is 3 cm. Pulmonary arteries are unremarkable. Mediastinum/Nodes: No significant mediastinal or axillary adenopathy is present. Esophagus and thoracic inlet are within normal limits. Lungs/Pleura: Mild dependent atelectasis is present. No focal airspace disease to is present. There is no nodule or mass lesion. No significant pleural or pericardial effusion is present. Upper Abdomen: Unremarkable. Musculoskeletal: Mild degenerative changes of the thoracic spine are stable. No focal lytic or blastic lesions are present. Review of the MIP images confirms the above findings. IMPRESSION: 1. 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 2. Diffuse atherosclerotic  disease within the descending aorta without focal aneurysm or change. Electronically Signed   By: San Morelle M.D.   On: 10/28/2016 15:11  I have independently reviewed the above radiology studies  and reviewed the findings with the patient.  ECHO 02/2016 Study Conclusions  - Left ventricle: The cavity size was normal. Wall thickness was  normal. Systolic function was normal. The estimated ejection  fraction was in the range of 50% to 55%. Wall motion was normal;  there were no regional wall motion abnormalities. Features are  consistent with a pseudonormal left ventricular filling pattern,  with concomitant abnormal relaxation and increased filling  pressure (grade 2 diastolic dysfunction). - Left atrium: The atrium was mildly dilated.  Impressions:  - Low normal LV systolic function; grade 2 diastolic dysfunction;  mild LAE; mild TR; severely dilated ascending aorta (51  mm);  suggest CTA or MRA to further assess.  Transthoracic echocardiography. M-mode, complete 2D, spectral Doppler, and color Doppler. Birthdate: Patient birthdate: 1975-10-15. Age: Patient is 41 yr old. Sex: Gender: female. BMI: 34.3 kg/m^2. Blood pressure: 133/72 Patient status: Inpatient. Study date: Study date: 03/07/2016. Study time: 04:35 PM. Location: Bedside.  -------------------------------------------------------------------  ------------------------------------------------------------------- Left ventricle: The cavity size was normal. Wall thickness was normal. Systolic function was normal. The estimated ejection fraction was in the range of 50% to 55%. Wall motion was normal; there were no regional wall motion abnormalities. Features are consistent with a pseudonormal left ventricular filling pattern, with concomitant abnormal relaxation and increased filling pressure (grade 2 diastolic dysfunction).  ------------------------------------------------------------------- Aortic valve: Trileaflet; normal thickness leaflets. Mobility was not restricted. Doppler: Transvalvular velocity was within the normal range. There was no stenosis. There was no regurgitation.  ------------------------------------------------------------------- Aorta: Aortic root: The aortic root was normal in size.  ------------------------------------------------------------------- Mitral valve: Structurally normal valve. Mobility was not restricted. Doppler: Transvalvular velocity was within the normal range. There was no evidence for stenosis. There was no regurgitation. Peak gradient (D): 3 mm Hg.  ------------------------------------------------------------------- Left atrium: The atrium was mildly dilated.  ------------------------------------------------------------------- Right ventricle: The cavity size was normal. Systolic function  was normal.  ------------------------------------------------------------------- Pulmonic valve: Doppler: Transvalvular velocity was within the normal range. There was no evidence for stenosis.  ------------------------------------------------------------------- Tricuspid valve: Structurally normal valve. Doppler: Transvalvular velocity was within the normal range. There was mild regurgitation.  ------------------------------------------------------------------- Right atrium: The atrium was normal in size.  ------------------------------------------------------------------- Pericardium: There was no pericardial effusion.  ------------------------------------------------------------------- Systemic veins: Inferior vena cava: The vessel was normal in size.  Recent Lab Findings: Lab Results  Component Value Date   WBC 6.7 07/28/2016   HGB 11.6 (L) 07/28/2016   HCT 35.7 (L) 07/28/2016   PLT 226 07/28/2016   GLUCOSE 94 07/28/2016   CHOL 183 06/18/2013   TRIG 75.0 06/18/2013   HDL 42.60 06/18/2013   LDLCALC 125 (H) 06/18/2013   ALT 8 07/18/2016   AST 11 07/18/2016   NA 140 07/28/2016   K 3.5 07/28/2016   CL 107 07/28/2016   CREATININE 0.84 07/28/2016   BUN 16 07/28/2016   CO2 26 07/28/2016   TSH 1.52 07/18/2016   HGBA1C 4.7 11/16/2011   Aortic Size Index=   4.8       /Body surface area is 2.39 meters squared. =2.06  < 2.75 cm/m2      4% risk per year 2.75 to 4.25          8% risk per year > 4.25 cm/m2    20% risk per year  Assessment / Plan:  Dilated descending aorta 61.25 cm in 41 year old female with no family history of dissection, no physical findings of connective tissue disorder, no evidence of aortic insufficiency, aortic valve is trileaflet Echo Paroxysmal atrial fibriillation  - Followed by Dr. Rayann Heman Obesity- status post sleeve gastroplasty  I reviewed carefully with the patientthe radiographic findings of dilated ascending aorta, I had  recommended follow-up mra  scan in 8 months  . The patient has specifically been warned to avoid strenuous lifting over 25 pounds, avoid pregnancy, avoid hypertension, avoid any legal or illegal drugs that may cause hypertension. Signs and symptoms of aortic dissection have been reviewed with her carefully.  I have again reviewed with the patient and put on her work papers the standard recommendation about strenuous lifting and Valsalva from the nature concerning aortic dissection.     Grace Isaac MD      [employer] [place] [address] Office [phone]   Beeper [phone]  10/31/2016 3:10 PM

## 2016-11-15 NOTE — Progress Notes (Deleted)
CARDIOLOGY OFFICE NOTE  Date:  11/16/2016    Tammie [name] Date of Birth: 04-Mar-1976 Medical Record [id number]  PCP:  Octavio Graves, DO  Cardiologist:  [facility]    No chief complaint on file.   History of Present Illness: Tammie [name] is a 41 y.o. female who presents today for a 4 month check Former patient of Dr. Winnifred Friar. Followed by me and Dr. Rayann Heman.   She has a history of HTN, morbid obesity, and PVCs. Other issues include seizure disorder and she has been found to have a bicuspid aortic valve and dilated ascending aorta and was referred to [facility] - seeing EBG. She has had successful weight loss following gastric sleeve.   She has been evaluated by EP after being found to have NSVT and afib on 30 day monitor. She reportedinitially having tachycardia diagnosed in her 56s after having tachypalpitations. She has been treatedwith verapamil.  She had had a seizure and while recovering from this episode, she had abrupt heart "racing" and was brought to [facility] where she was found to have atrial fibrillation. Her atrial fibrillation stopped on its own. Repeat event monitor was negative back in June of 2017. Has been referred for anemia.   ER visit back in July of 2017 for chest pain - had CT scan - negative for dissection.  I saw her back in early August - has continued to have seizures - on higher doses of Keppra - she has continued to question this diagnosis - wonders if it is precipitated by low BP. She was currently out of work and was not allowed to go back until she has been seizure free for 30 days. Had lots of questions about her aneurysm - wanting to get pregnant - not using any birth control and is sexually active.  She was very upset about being told to not get pregnant. Discussed her lifting restrictions which clearly interferes with her job. 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   I last saw her in October -  general health issues but cardiac status seemed ok. Lots of issues with trying to get her work issues straightened out. She has seen EBG last month and has letter given that outlines her restrictions.   Comes in today. Here alone.    Past Medical History:  Diagnosis Date  . Abnormal Pap smear   . Anemia   . Aortic root enlargement ([place]) 5/17   5.1cm thoracic aorta by echo  . Asthma    as child  . Esophageal reflux   . Fibroid   . Hypertension   . Hypopotassemia   . Obesity    s/p gastric sleeve  . Palpitations   . Paroxysmal atrial fibrillation (H[facility])   . Pregnancy induced hypertension   . Premature ventricular contraction   . Seizure [facility])     Past Surgical History:  Procedure Laterality Date  . COLPOSCOPY    . [place] RESECTION  9/16   [facility]  . OOPHORECTOMY     Left  . TUBAL LIGATION       Medications: Current Outpatient Prescriptions  Medication Sig Dispense Refill  . diltiazem (CARDIZEM) 30 MG tablet Take 30 mg by mouth 4 (four) times daily as needed.    . levETIRAcetam 750 MG TB24 Take 3 tablets (2,250 mg total) by mouth daily. 90 tablet 5  . magic mouthwash SOLN Take 30 mLs by mouth 3 (three)  times daily. 630 mL 2   No current facility-administered medications for this visit.     Allergies: Allergies  Allergen Reactions  . Sulfa Antibiotics Anaphylaxis, Rash and Other (See Comments)    "Burning"  . Sulfamethoxazole Anaphylaxis, Rash and Other (See Comments)    burning  . Tape Rash    Leads on heart monitor have caused a rash    Social History: The patient  reports that she has never smoked. She has never used smokeless tobacco. She reports that she drinks alcohol. She reports that she does not use drugs.   Family History: The patient's family history includes Asthma in her son; Cancer in her father; Diabetes in her father and maternal grandmother; Epilepsy in her cousin, maternal aunt, and maternal uncle; Febrile  seizures in her son; Hypertension in her father and paternal grandmother; Kidney disease in her father.   Review of Systems: Please see the history of present illness.   Otherwise, the review of systems is positive for none.   All other systems are reviewed and negative.   Physical Exam: VS:  There were no vitals taken for this visit. Marland Kitchen  BMI There is no height or weight on file to calculate BMI.  Wt Readings from Last 3 Encounters:  10/31/16 255 lb (115.7 kg)  10/24/16 260 lb 7 oz (118.1 kg)  08/25/16 243 lb (110.2 kg)    General: Pleasant. Well developed, well nourished and in no acute distress.   HEENT: Normal.  Neck: Supple, no JVD, carotid bruits, or masses noted.  Cardiac: Regular rate and rhythm. No murmurs, rubs, or gallops. No edema.  Respiratory:  Lungs are clear to auscultation bilaterally with normal work of breathing.  GI: Soft and nontender.  MS: No deformity or atrophy. Gait and ROM intact.  Skin: Warm and dry. Color is normal.  Neuro:  Strength and sensation are intact and no gross focal deficits noted.  Psych: Alert, appropriate and with normal affect.   LABORATORY DATA:  EKG:  EKG is not ordered today.  Lab Results  Component Value Date   WBC 6.7 07/28/2016   HGB 11.6 (L) 07/28/2016   HCT 35.7 (L) 07/28/2016   PLT 226 07/28/2016   GLUCOSE 94 07/28/2016   CHOL 183 06/18/2013   TRIG 75.0 06/18/2013   HDL 42.60 06/18/2013   LDLCALC 125 (H) 06/18/2013   ALT 8 07/18/2016   AST 11 07/18/2016   NA 140 07/28/2016   K 3.5 07/28/2016   CL 107 07/28/2016   CREATININE 0.84 07/28/2016   BUN 16 07/28/2016   CO2 26 07/28/2016   TSH 1.52 07/18/2016   HGBA1C 4.7 11/16/2011    BNP (last 3 results) No results for input(s): BNP in the last 8760 hours.  ProBNP (last 3 results) No results for input(s): PROBNP in the last 8760 hours.   Other Studies Reviewed Today:  Event monitor Study Highlights June 2017  Predominant rhythm is sinus rhythm No sustained  arrhythmias  3 symptomatic transmissions were recorded. 1 corresponded to sinus rhythm, 1 corresponded to sinus rhythm with very frequent premature atrial contractions, and 1 corresponded to sinus rhythm with 10 beats of nonsustained ventricular tachycardia  No other ventricular ectopy recorded     ECHO 02/2016 Study Conclusions  - Left ventricle: The cavity size was normal. Wall thickness was  normal. Systolic function was normal. The estimated ejection  fraction was in the range of 50% to 55%. Wall motion was normal;  there were no regional wall motion  abnormalities. Features are  consistent with a pseudonormal left ventricular filling pattern,  with concomitant abnormal relaxation and increased filling  pressure (grade 2 diastolic dysfunction). - Left atrium: The atrium was mildly dilated.  Impressions:  - Low normal LV systolic function; grade 2 diastolic dysfunction;  mild LAE; mild TR; severely dilated ascending aorta (51 mm);  suggest CTA or MRA to further assess.  MRI IMPRESSION from 04/2016: 1. 4.8 cm ascending aortic aneurysm without complicating features. Recommend semi-annual imaging followup by CTA or MRA and referral to cardiothoracic surgery if not already obtained. This recommendation follows 2010 ACCF/AHA/AATS/ACR/ASA/SCA/SCAI/SIR/STS/SVM Guidelines for the Diagnosis and Management of Patients With Thoracic Aortic Disease. Circulation. 2010; 121: [id number]  Assessment/Plan: 1. History of HTN - BP now running low - she does liberalize her salt a little - goal is to keep her BP below 140/90. No medicines to titrate down.   2. Dilated ascending aortic aneurysm - seeing EBG - we have reviewed her restrictions - she understands to avoid strenuous lifting over 25 pounds, avoid pregnancy, &avoid HTN. She was to have a repeat scan in 6 months. July ER visit for chest pain - with negative CT for dissection. Pregnancy is not advised in this situation.  She tells me today that she has had prior tubal ligation. She is to see EBG in January. I have given her another letter stating that she will NEVER be able to lift over 25 pounds - her work environment needs to be modified.   3. PAF - lone episode - Dr. Rayann Heman has wished to avoid anticoagulation given onset of seizures - to use prn Cardizem - she has not had to use. No recurrence.   4. History of NSVT - No evidence of structural heart disease. No FH of arrhythmias. No ischemic symptoms - recommended previously - tomaintain electrolytes within therapeutic range and continue to follow conservatively  4. Obesity - prior gastric sleeve with successful weight loss - she is down 50 pounds since her last visit with me.  5. Seizure disorder - followed by neurology. Not able to return to work until she has been seizure free for 30 days - not clear to me if she is still having some seizures at night.   6. Fatigue - will check TSH - suspect stress/frustration as the etiology.   7. Stomach pain - ? Constipation - would use Miralax and stool softeners as needed.    Current medicines are reviewed with the patient today.  The patient does not have concerns regarding medicines other than what has been noted above.  The following changes have been made:  See above.  Labs/ tests ordered today include:   No orders of the defined types were placed in this encounter.    Disposition:   FU with *** in {gen number [id number] {Days to years:10300}.   Patient is agreeable to this plan and will call if any problems develop in the interim.   SignedTruitt Merle, NP  11/16/2016 7:53 AM  [facility] [address] [place], [place]  [address] Phone: [phone] Fax: [phone]

## 2016-11-16 ENCOUNTER — Ambulatory Visit: Payer: 59 | Admitting: Nurse Practitioner

## 2016-11-17 ENCOUNTER — Other Ambulatory Visit: Payer: Self-pay

## 2016-11-17 ENCOUNTER — Ambulatory Visit: Payer: 59 | Admitting: Cardiothoracic Surgery

## 2016-11-20 ENCOUNTER — Emergency Department (HOSPITAL_COMMUNITY)
Admission: EM | Admit: 2016-11-20 | Discharge: 2016-11-20 | Disposition: A | Discharge status: Home / Self Care | Payer: 59 | Attending: Emergency Medicine | Admitting: Emergency Medicine

## 2016-11-20 ENCOUNTER — Emergency Department (HOSPITAL_COMMUNITY): Payer: 59

## 2016-11-20 ENCOUNTER — Encounter (HOSPITAL_COMMUNITY): Payer: Self-pay

## 2016-11-20 DIAGNOSIS — R1084 Generalized abdominal pain: Secondary | ICD-10-CM

## 2016-11-20 DIAGNOSIS — I10 Essential (primary) hypertension: Secondary | ICD-10-CM | POA: Insufficient documentation

## 2016-11-20 DIAGNOSIS — J45909 Unspecified asthma, uncomplicated: Secondary | ICD-10-CM | POA: Insufficient documentation

## 2016-11-20 DIAGNOSIS — B9689 Other specified bacterial agents as the cause of diseases classified elsewhere: Secondary | ICD-10-CM | POA: Insufficient documentation

## 2016-11-20 DIAGNOSIS — N83202 Unspecified ovarian cyst, left side: Secondary | ICD-10-CM | POA: Insufficient documentation

## 2016-11-20 DIAGNOSIS — N760 Acute vaginitis: Secondary | ICD-10-CM | POA: Insufficient documentation

## 2016-11-20 DIAGNOSIS — Z79899 Other long term (current) drug therapy: Secondary | ICD-10-CM | POA: Insufficient documentation

## 2016-11-20 DIAGNOSIS — N83209 Unspecified ovarian cyst, unspecified side: Secondary | ICD-10-CM

## 2016-11-20 LAB — CBC
HEMATOCRIT: 35 % — AB (ref 36.0–46.0)
Hemoglobin: 11.5 g/dL — ABNORMAL LOW (ref 12.0–15.0)
MCH: 29.4 pg (ref 26.0–34.0)
MCHC: 32.9 g/dL (ref 30.0–36.0)
MCV: 89.5 fL (ref 78.0–100.0)
Platelets: 264 10*3/uL (ref 150–400)
RBC: 3.91 MIL/uL (ref 3.87–5.11)
RDW: 12.6 % (ref 11.5–15.5)
WBC: 10.3 10*3/uL (ref 4.0–10.5)

## 2016-11-20 LAB — COMPREHENSIVE METABOLIC PANEL
ALT: 11 U/L — ABNORMAL LOW (ref 14–54)
AST: 14 U/L — AB (ref 15–41)
Albumin: 3.7 g/dL (ref 3.5–5.0)
Alkaline Phosphatase: 51 U/L (ref 38–126)
Anion gap: 8 (ref 5–15)
BUN: 15 mg/dL (ref 6–20)
CHLORIDE: 104 mmol/L (ref 101–111)
CO2: 23 mmol/L (ref 22–32)
Calcium: 8.6 mg/dL — ABNORMAL LOW (ref 8.9–10.3)
Creatinine, Ser: 0.86 mg/dL (ref 0.44–1.00)
GFR calc Af Amer: >60 mL/min (ref >60)
Glucose, Bld: 101 mg/dL — ABNORMAL HIGH (ref 65–99)
POTASSIUM: 3.9 mmol/L (ref 3.5–5.1)
Sodium: 135 mmol/L (ref 135–145)
Total Bilirubin: 0.1 mg/dL — ABNORMAL LOW (ref 0.3–1.2)
Total Protein: 7.3 g/dL (ref 6.5–8.1)

## 2016-11-20 LAB — URINALYSIS, ROUTINE W REFLEX MICROSCOPIC
BACTERIA UA: NONE SEEN
BILIRUBIN URINE: NEGATIVE
Glucose, UA: NEGATIVE mg/dL
KETONES UR: NEGATIVE mg/dL
Nitrite: NEGATIVE
Protein, ur: NEGATIVE mg/dL
Specific Gravity, Urine: 1.031 — ABNORMAL HIGH (ref 1.005–1.030)
pH: 5 (ref 5.0–8.0)

## 2016-11-20 LAB — WET PREP, GENITAL
CLUE CELLS WET PREP: NONE SEEN
Sperm: NONE SEEN
TRICH WET PREP: NONE SEEN
YEAST WET PREP: NONE SEEN

## 2016-11-20 LAB — LIPASE, BLOOD: LIPASE: 18 U/L (ref 11–51)

## 2016-11-20 LAB — POC URINE PREG, ED: PREG TEST UR: NEGATIVE

## 2016-11-20 MED ORDER — ONDANSETRON HCL 4 MG/2ML IJ SOLN
4.0000 mg | Freq: Once | INTRAMUSCULAR | Status: AC
  Administered 2016-11-20: 4 mg via INTRAVENOUS
  Filled 2016-11-20: qty 2

## 2016-11-20 MED ORDER — IOPAMIDOL (ISOVUE-370) INJECTION 76%
100.0000 mL | Freq: Once | INTRAVENOUS | Status: AC | PRN
  Administered 2016-11-20: 100 mL via INTRAVENOUS

## 2016-11-20 MED ORDER — METRONIDAZOLE 500 MG PO TABS
500.0000 mg | ORAL_TABLET | Freq: Once | ORAL | Status: AC
  Administered 2016-11-20: 500 mg via ORAL
  Filled 2016-11-20: qty 1

## 2016-11-20 MED ORDER — IOPAMIDOL (ISOVUE-370) INJECTION 76%
INTRAVENOUS | Status: AC
  Filled 2016-11-20: qty 100

## 2016-11-20 MED ORDER — METRONIDAZOLE 500 MG PO TABS: 500.0000 mg | ORAL_TABLET | Freq: Two times a day (BID) | ORAL | 0 refills | Status: AC

## 2016-11-20 MED ORDER — FENTANYL CITRATE (PF) 100 MCG/2ML IJ SOLN
50.0000 ug | Freq: Once | INTRAMUSCULAR | Status: AC
  Administered 2016-11-20: 50 ug via INTRAVENOUS

## 2016-11-20 MED ORDER — FENTANYL CITRATE (PF) 100 MCG/2ML IJ SOLN
INTRAMUSCULAR | Status: AC
  Filled 2016-11-20: qty 2

## 2016-11-20 MED ORDER — ONDANSETRON 8 MG PO TBDP: ORAL_TABLET | ORAL | 0 refills | Status: AC

## 2016-11-20 MED ORDER — SUCRALFATE 1 GM/10ML PO SUSP: 1.0000 g | Freq: Three times a day (TID) | ORAL | 0 refills | Status: DC

## 2016-11-20 MED ORDER — KETOROLAC TROMETHAMINE 30 MG/ML IJ SOLN
30.0000 mg | Freq: Once | INTRAMUSCULAR | Status: AC
  Administered 2016-11-20: 30 mg via INTRAVENOUS
  Filled 2016-11-20: qty 1

## 2016-11-20 MED ORDER — SODIUM CHLORIDE 0.9 % IV BOLUS (SEPSIS)
500.0000 mL | Freq: Once | INTRAVENOUS | Status: AC
  Administered 2016-11-20: 500 mL via INTRAVENOUS

## 2016-11-20 MED ORDER — DICYCLOMINE HCL 20 MG PO TABS: 20.0000 mg | ORAL_TABLET | Freq: Two times a day (BID) | ORAL | 0 refills | Status: AC

## 2016-11-20 NOTE — ED Triage Notes (Signed)
States on her menstral and woke up yesterday am with mid back pain radiates down her legs, abdominal pain with nausea voiced no bowel or bladder problems voiced.

## 2016-11-20 NOTE — ED Provider Notes (Signed)
[place] DEPT Provider Note   CSN: [id number] Arrival date & time: 11/20/16  0034 By signing my name below, I, Dyke Brackett, attest that this documentation has been prepared under the direction and in the presence of Meilyn Heindl, MD . Electronically Signed: Dyke Brackett, Scribe. 11/20/2016. 3:56 AM.   History   Chief Complaint Chief Complaint  Patient presents with  . Abdominal Pain  . Back Pain   HPI Tammie [name] is a 41 y.o. female with a PMHx abnormal pap smear and fibroids who presents to the Emergency Department complaining of gradually worsening, constant, 10/10 diffuse abdominal pain onset yesterday. Pt describes her abdominal pain as diffuse pain that begins at her ribs and radiates to her BLE. She notes associated nausea and back pain. Pt is taking Ibuprofen with no relief of pain. Pt is currently on her period and states it has never been this severe; per pt, her menstrual period is on time and is lighter than normal. No new medications. Pt denies any diarrhea or vomiting. Pt has no other complaints or symptoms at this time.    The history is provided by the patient. No language interpreter was used.  Abdominal Pain   This is a new problem. The current episode started yesterday. The problem occurs constantly. The problem has been gradually worsening. The pain is located in the generalized abdominal region and legs. The pain is at a severity of 10/10. Associated symptoms include nausea. Pertinent negatives include anorexia, diarrhea, flatus, hematochezia, vomiting, hematuria, headaches, arthralgias and myalgias. Nothing aggravates the symptoms. Nothing relieves the symptoms. Past workup does not include GI consult. Her past medical history does not include PUD.  Back Pain   This is a new problem. The current episode started yesterday. The problem occurs constantly. The problem has been gradually worsening. The pain is associated with no known injury. The pain is  present in the lumbar spine. Associated symptoms include abdominal pain. Pertinent negatives include no chest pain and no headaches.    Past Medical History:  Diagnosis Date  . Abnormal Pap smear   . Anemia   . Aortic root enlargement ([place]) 5/17   5.1cm thoracic aorta by echo  . Asthma    as child  . Esophageal reflux   . Fibroid   . Hypertension   . Hypopotassemia   . Obesity    s/p gastric sleeve  . Palpitations   . Paroxysmal atrial fibrillation (HCC)   . Pregnancy induced hypertension   . Premature ventricular contraction   . Seizure [facility])     Patient Active Problem List   Diagnosis Date Noted  . NSVT (nonsustained ventricular tachycardia) ([place]) 03/25/2016  . Seizure ([place]) 03/06/2016  . Atrial fibrillation ([place])   . Essential hypertension 01/20/2016  . Faintness 01/20/2016  . Tooth fracture 11/08/2013  . OBESITY-MORBID (>100') 02/03/2009  . HYPERTENSION, BENIGN 02/03/2009    Past Surgical History:  Procedure Laterality Date  . COLPOSCOPY    . [place] RESECTION  9/16   [facility]  . OOPHORECTOMY     Left  . TUBAL LIGATION      OB History    Gravida Para Term Preterm AB Living   2 2 2     2    SAB TAB Ectopic Multiple Live Births                   Home Medications    Prior to Admission medications   Medication Sig Start Date End  Date Taking? Authorizing Provider  diltiazem (CARDIZEM) 30 MG tablet Take 30 mg by mouth 4 (four) times daily as needed.    Historical Provider, MD  levETIRAcetam 750 MG TB24 Take 3 tablets (2,250 mg total) by mouth daily. 10/24/16   Donika Keith Rake, DO  magic mouthwash SOLN Take 30 mLs by mouth 3 (three) times daily. 09/06/16   Alda Berthold, DO    Family History Family History  Problem Relation Age of Onset  . Hypertension Father   . Kidney disease Father   . Cancer Father   . Diabetes Father   . Diabetes Maternal Grandmother   . Hypertension Paternal Grandmother   . Epilepsy Cousin     . Epilepsy Maternal Aunt   . Epilepsy Maternal Uncle   . Febrile seizures Son   . Asthma Son     Social History Social History  Substance Use Topics  . Smoking status: Never Smoker  . Smokeless tobacco: Never Used  . Alcohol use 0.0 oz/week     Comment: drank last night; does not usually drink     Allergies   Sulfa antibiotics; Sulfamethoxazole; and Tape   Review of Systems Review of Systems  Respiratory: Negative for cough, choking and shortness of breath.   Cardiovascular: Negative for chest pain and leg swelling.  Gastrointestinal: Positive for abdominal pain and nausea. Negative for anorexia, diarrhea, flatus, hematochezia and vomiting.  Genitourinary: Negative for hematuria.  Musculoskeletal: Positive for back pain. Negative for arthralgias and myalgias.  Neurological: Negative for headaches.  All other systems reviewed and are negative.  Physical Exam Updated Vital Signs BP (!) 172/109 (BP Location: Right Arm)   Pulse 75   Temp 98.2 F (36.8 C) (Oral)   Resp 20   Ht 5\' 10"  (1.778 m)   Wt 255 lb (115.7 kg)   SpO2 96%   BMI 36.59 kg/m   Physical Exam  Constitutional: She is oriented to person, place, and time. She appears well-developed and well-nourished. No distress.  HENT:  Head: Normocephalic and atraumatic.  Mouth/Throat: Oropharynx is clear and moist. No oropharyngeal exudate.  Moist mucous membranes   Eyes: Conjunctivae are normal. Pupils are equal, round, and reactive to light.  Neck: Normal range of motion. Neck supple. No JVD present.  Trachea midline No bruit  Cardiovascular: Normal rate, regular rhythm, normal heart sounds and intact distal pulses.   Pulses:      Femoral pulses are 3+ on the right side. Pulmonary/Chest: Effort normal and breath sounds normal. No stridor. No respiratory distress. She has no wheezes.  Abdominal: Soft. Bowel sounds are normal. She exhibits no distension and no mass. There is no tenderness. There is no rebound  and no guarding.  Genitourinary: No vaginal discharge found.  Genitourinary Comments: Scant bleeding chaperone present.  Small palpable fibroid  Musculoskeletal: Normal range of motion.  Neurological: She is alert and oriented to person, place, and time. She has normal reflexes.  Skin: Skin is warm and dry. Capillary refill takes less than 2 seconds.  Psychiatric: She has a normal mood and affect. Her behavior is normal.  Nursing note and vitals reviewed.  ED Treatments / Results   Vitals:   11/20/16 0342 11/20/16 0602  BP: 150/88 143/67  Pulse: 82 68  Resp: 18 14  Temp: 97.4 F (36.3 C)     DIAGNOSTIC STUDIES:  Oxygen Saturation is 98% on RA, normal by my interpretation.    COORDINATION OF CARE:  3:50 AM Discussed treatment plan with pt  at bedside and pt agreed to plan.  Labs (all labs ordered are listed, but only abnormal results are displayed) Labs Reviewed  COMPREHENSIVE METABOLIC PANEL - Abnormal; Notable for the following:       Result Value   Glucose, Bld 101 (*)    Calcium 8.6 (*)    AST 14 (*)    ALT 11 (*)    Total Bilirubin 0.1 (*)    All other components within normal limits  CBC - Abnormal; Notable for the following:    Hemoglobin 11.5 (*)    HCT 35.0 (*)    All other components within normal limits  LIPASE, BLOOD  URINALYSIS, ROUTINE W REFLEX MICROSCOPIC  POC URINE PREG, ED   Results for orders placed or performed during the hospital encounter of 11/20/16  Wet prep, genital  Result Value Ref Range   Yeast Wet Prep HPF POC NONE SEEN NONE SEEN   Trich, Wet Prep NONE SEEN NONE SEEN   Clue Cells Wet Prep HPF POC NONE SEEN NONE SEEN   WBC, Wet Prep HPF POC MODERATE (A) NONE SEEN   Sperm NONE SEEN   Lipase, blood  Result Value Ref Range   Lipase 18 11 - 51 U/L  Comprehensive metabolic panel  Result Value Ref Range   Sodium 135 135 - 145 mmol/L   Potassium 3.9 3.5 - 5.1 mmol/L   Chloride 104 101 - 111 mmol/L   CO2 23 22 - 32 mmol/L   Glucose, Bld  101 (H) 65 - 99 mg/dL   BUN 15 6 - 20 mg/dL   Creatinine, Ser 0.86 0.44 - 1.00 mg/dL   Calcium 8.6 (L) 8.9 - 10.3 mg/dL   Total Protein 7.3 6.5 - 8.1 g/dL   Albumin 3.7 3.5 - 5.0 g/dL   AST 14 (L) 15 - 41 U/L   ALT 11 (L) 14 - 54 U/L   Alkaline Phosphatase 51 38 - 126 U/L   Total Bilirubin 0.1 (L) 0.3 - 1.2 mg/dL   GFR calc non Af Amer >60 >60 mL/min   GFR calc Af Amer >60 >60 mL/min   Anion gap 8 5 - 15  CBC  Result Value Ref Range   WBC 10.3 4.0 - 10.5 K/uL   RBC 3.91 3.87 - 5.11 MIL/uL   Hemoglobin 11.5 (L) 12.0 - 15.0 g/dL   HCT 35.0 (L) 36.0 - 46.0 %   MCV 89.5 78.0 - 100.0 fL   MCH 29.4 26.0 - 34.0 pg   MCHC 32.9 30.0 - 36.0 g/dL   RDW 12.6 11.5 - 15.5 %   Platelets 264 150 - 400 K/uL  Urinalysis, Routine w reflex microscopic  Result Value Ref Range   Color, Urine YELLOW YELLOW   APPearance HAZY (A) CLEAR   Specific Gravity, Urine 1.031 (H) 1.005 - 1.030   pH 5.0 5.0 - 8.0   Glucose, UA NEGATIVE NEGATIVE mg/dL   Hgb urine dipstick LARGE (A) NEGATIVE   Bilirubin Urine NEGATIVE NEGATIVE   Ketones, ur NEGATIVE NEGATIVE mg/dL   Protein, ur NEGATIVE NEGATIVE mg/dL   Nitrite NEGATIVE NEGATIVE   Leukocytes, UA TRACE (A) NEGATIVE   RBC / HPF TOO NUMEROUS TO COUNT 0 - 5 RBC/hpf   WBC, UA 6-30 0 - 5 WBC/hpf   Bacteria, UA NONE SEEN NONE SEEN   Squamous Epithelial / LPF 0-5 (A) NONE SEEN   Mucous PRESENT    Ct Angio Chest Aorta W/cm &/or Wo/cm  Result Date: 10/28/2016 CLINICAL DATA:  Thoracic  aortic aneurysm without rupture. EXAM: CT ANGIOGRAPHY CHEST WITH CONTRAST TECHNIQUE: Multidetector CT imaging of the chest was performed using the standard protocol during bolus administration of intravenous contrast. Multiplanar CT image reconstructions and MIPs were obtained to evaluate the vascular anatomy. CONTRAST:  75 mL Isovue 370 COMPARISON:  CTA of the chest 05/07/2016. MRA of the chest 04/13/2016 FINDINGS: Cardiovascular: The heart is mildly enlarged. An ascending aortic  aneurysm is stable in size 4.8 cm. A 3 vessel arch configuration is present. There is some atherosclerotic disease within the descending aorta without a discrete aneurysm. The maximum transverse diameter is 3 cm. Pulmonary arteries are unremarkable. Mediastinum/Nodes: No significant mediastinal or axillary adenopathy is present. Esophagus and thoracic inlet are within normal limits. Lungs/Pleura: Mild dependent atelectasis is present. No focal airspace disease to is present. There is no nodule or mass lesion. No significant pleural or pericardial effusion is present. Upper Abdomen: Unremarkable. Musculoskeletal: Mild degenerative changes of the thoracic spine are stable. No focal lytic or blastic lesions are present. Review of the MIP images confirms the above findings. IMPRESSION: 1. 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 2. Diffuse atherosclerotic disease within the descending aorta without focal aneurysm or change. Electronically Signed   By: San Morelle M.D.   On: 10/28/2016 15:11    Procedures Procedures (including critical care time)  Medications Ordered in ED  Medications  iopamidol (ISOVUE-370) 76 % injection (not administered)  fentaNYL (SUBLIMAZE) injection 50 mcg (50 mcg Intravenous Given 11/20/16 0422)  iopamidol (ISOVUE-370) 76 % injection 100 mL (100 mLs Intravenous Contrast Given 11/20/16 0441)  ondansetron (ZOFRAN) injection 4 mg (4 mg Intravenous Given 11/20/16 0432)  sodium chloride 0.9 % bolus 500 mL (0 mLs Intravenous Stopped 11/20/16 0602)  ketorolac (TORADOL) 30 MG/ML injection 30 mg (30 mg Intravenous Given 11/20/16 0558)    Patient berated ED doctor for reading information confirming information printed in patient's chart with her with scribe present.  EDP kindly stated this information was being confirmed with patient for it's accuracy and stated   CT negative for acute pathology, case d/w Dr. Gerilyn Nestle, small lesion posterior to uterus may represent a fibroid,  recommends GYN follow up for this an ovarian cyst.   Final Clinical Impressions(s) / ED Diagnoses   Patient has been taking NSAIDs for this pain.  I suspect that given it starts in epigastrum it is is gastritis secondary to NSAIDs.  I suspect her menstrual cycle cramping is worse secondary to this uterine lesion which appears to be a recurrent fibroid.  Will treat for BV.  Will treat for same.  Have instructed patient to follow up with GYN and her PMD. All questions answered to patient's satisfaction. Based on history and exam patient has been appropriately medically screened and emergency conditions excluded. Patient is stable for discharge at this time. Strict return precautions given for any further episodes, persistent fever, weakness or any concerns. New Prescriptions New Prescriptions   No medications on file   I personally performed the services described in this documentation, which was scribed in my presence. The recorded information has been reviewed and is accurate.      Kaeya Schiffer, MD 11/20/16 0700

## 2016-11-20 NOTE — ED Notes (Signed)
Pt transported to CT ?

## 2016-11-20 NOTE — ED Notes (Signed)
Pt ambulated to restroom with steady gait.

## 2016-11-21 LAB — GC/CHLAMYDIA PROBE AMP (~~LOC~~) NOT AT ARMC
Chlamydia: NEGATIVE
Neisseria Gonorrhea: NEGATIVE

## 2016-11-23 ENCOUNTER — Encounter: Payer: Self-pay | Admitting: Nurse Practitioner

## 2016-11-30 ENCOUNTER — Other Ambulatory Visit: Payer: Self-pay | Admitting: *Deleted

## 2016-11-30 ENCOUNTER — Emergency Department (HOSPITAL_COMMUNITY)
Admission: EM | Admit: 2016-11-30 | Discharge: 2016-11-30 | Disposition: A | Discharge status: Home / Self Care | Payer: 59 | Attending: Emergency Medicine | Admitting: Emergency Medicine

## 2016-11-30 ENCOUNTER — Encounter (HOSPITAL_COMMUNITY): Payer: Self-pay | Admitting: Emergency Medicine

## 2016-11-30 DIAGNOSIS — J45909 Unspecified asthma, uncomplicated: Secondary | ICD-10-CM | POA: Insufficient documentation

## 2016-11-30 DIAGNOSIS — Z79899 Other long term (current) drug therapy: Secondary | ICD-10-CM | POA: Insufficient documentation

## 2016-11-30 DIAGNOSIS — I10 Essential (primary) hypertension: Secondary | ICD-10-CM | POA: Insufficient documentation

## 2016-11-30 DIAGNOSIS — R569 Unspecified convulsions: Secondary | ICD-10-CM | POA: Insufficient documentation

## 2016-11-30 LAB — COMPREHENSIVE METABOLIC PANEL
ALBUMIN: 3.7 g/dL (ref 3.5–5.0)
ALK PHOS: 44 U/L (ref 38–126)
ALT: 17 U/L (ref 14–54)
ANION GAP: 5 (ref 5–15)
AST: 17 U/L (ref 15–41)
BILIRUBIN TOTAL: 0.4 mg/dL (ref 0.3–1.2)
BUN: 10 mg/dL (ref 6–20)
CALCIUM: 8.5 mg/dL — AB (ref 8.9–10.3)
CO2: 27 mmol/L (ref 22–32)
Chloride: 105 mmol/L (ref 101–111)
Creatinine, Ser: 0.58 mg/dL (ref 0.44–1.00)
GFR calc non Af Amer: >60 mL/min (ref >60)
GLUCOSE: 87 mg/dL (ref 65–99)
Potassium: 3.9 mmol/L (ref 3.5–5.1)
SODIUM: 137 mmol/L (ref 135–145)
TOTAL PROTEIN: 7.3 g/dL (ref 6.5–8.1)

## 2016-11-30 LAB — URINALYSIS, ROUTINE W REFLEX MICROSCOPIC
Bilirubin Urine: NEGATIVE
GLUCOSE, UA: NEGATIVE mg/dL
Hgb urine dipstick: NEGATIVE
KETONES UR: NEGATIVE mg/dL
LEUKOCYTES UA: NEGATIVE
NITRITE: NEGATIVE
PH: 7 (ref 5.0–8.0)
Protein, ur: NEGATIVE mg/dL
SPECIFIC GRAVITY, URINE: 1.02 (ref 1.005–1.030)

## 2016-11-30 LAB — CBG MONITORING, ED: Glucose-Capillary: 77 mg/dL (ref 65–99)

## 2016-11-30 LAB — CBC WITH DIFFERENTIAL/PLATELET
BASOS ABS: 0 10*3/uL (ref 0.0–0.1)
BASOS PCT: 0 %
Eosinophils Absolute: 0.1 10*3/uL (ref 0.0–0.7)
Eosinophils Relative: 1 %
HEMATOCRIT: 33.6 % — AB (ref 36.0–46.0)
HEMOGLOBIN: 11.1 g/dL — AB (ref 12.0–15.0)
LYMPHS PCT: 19 %
Lymphs Abs: 1.7 10*3/uL (ref 0.7–4.0)
MCH: 29.4 pg (ref 26.0–34.0)
MCHC: 33 g/dL (ref 30.0–36.0)
MCV: 88.9 fL (ref 78.0–100.0)
MONO ABS: 0.5 10*3/uL (ref 0.1–1.0)
Monocytes Relative: 6 %
NEUTROS ABS: 6.7 10*3/uL (ref 1.7–7.7)
NEUTROS PCT: 74 %
Platelets: 277 10*3/uL (ref 150–400)
RBC: 3.78 MIL/uL — ABNORMAL LOW (ref 3.87–5.11)
RDW: 12.5 % (ref 11.5–15.5)
WBC: 9 10*3/uL (ref 4.0–10.5)

## 2016-11-30 MED ORDER — IBUPROFEN 800 MG PO TABS
800.0000 mg | ORAL_TABLET | Freq: Once | ORAL | Status: DC
Start: 2016-11-30 — End: 2016-11-30

## 2016-11-30 MED ORDER — ACETAMINOPHEN 325 MG PO TABS
650.0000 mg | ORAL_TABLET | Freq: Once | ORAL | Status: AC
  Administered 2016-11-30: 650 mg via ORAL
  Filled 2016-11-30: qty 2

## 2016-11-30 MED ORDER — LEVETIRACETAM ER 750 MG PO TB24: 2250.0000 mg | ORAL_TABLET | Freq: Every day | ORAL | 5 refills | Status: DC

## 2016-11-30 MED ORDER — LEVETIRACETAM 750 MG PO TABS
2250.0000 mg | ORAL_TABLET | Freq: Once | ORAL | Status: DC
  Filled 2016-11-30: qty 3

## 2016-11-30 MED ORDER — LEVETIRACETAM ER 500 MG PO TB24
2250.0000 mg | ORAL_TABLET | Freq: Every day | ORAL | Status: DC
  Administered 2016-11-30: 2500 mg via ORAL
  Filled 2016-11-30: qty 5

## 2016-11-30 NOTE — ED Notes (Signed)
Bed: WA13 Expected date:  Expected time:  Means of arrival:  Comments: EMS seizure 

## 2016-11-30 NOTE — ED Triage Notes (Signed)
Per EMS, patient had witnessed 2 minute full body seizure. Patient post-ictal upon EMS arrival. Hx of seizures. Patient takes [place]. Patient had a dose change 2 weeks ago, however patient hasn't gotten the higher dose filled yet.

## 2016-11-30 NOTE — ED Provider Notes (Signed)
Tammie [name] Provider Note   CSN: [id number] Arrival date & time: 11/30/16  [id number]     History   Chief Complaint Chief Complaint  Patient presents with  . Seizures    HPI Tammie [name] is a 41 y.o. female.  Tammie [name] is a 89 female presenting to the ED post seizure. She was found by her son at 9 am this morning in bed and transported to the ED by EMS.  She is complaining of generalized muscle soreness and lip/tongue pain. Her first seizure occurred some time last year and her most recent seizure was one month ago. Her Keppra dosage was recently changed and she has not yet started taking the adjusted dose. She is followed by neurologist Dr. Posey Pronto. She reports taking Keppra 750 mg nightly. She is supposed to be taking Keppra 2250 mg daily. EMS reports patient was postictal on arrival. Patient is currently being treated for BV with Flagyl. Patient denies recent illness. She denies fevers, cough, chest pain, shortness of breath, abdominal pain, nausea, vomiting, diarrhea, urinary symptoms, double vision, numbness, tingling, weakness or rashes.   The history is provided by the patient, medical records and the EMS personnel. No language interpreter was used.  Seizures   Associated symptoms include headaches. Pertinent negatives include no speech difficulty, no visual disturbance, no sore throat, no chest pain, no cough, no nausea, no vomiting and no diarrhea.    Past Medical History:  Diagnosis Date  . Abnormal Pap smear   . Anemia   . Aortic root enlargement ([place]) 5/17   5.1cm thoracic aorta by echo  . Asthma    as child  . Esophageal reflux   . Fibroid   . Hypertension   . Hypopotassemia   . Obesity    s/p gastric sleeve  . Palpitations   . Paroxysmal atrial fibrillation (HCC)   . Pregnancy induced hypertension   . Premature ventricular contraction   . Seizure [facility])     Patient Active Problem List   Diagnosis Date Noted  . NSVT (nonsustained  ventricular tachycardia) ([place]) 03/25/2016  . Seizure ([place]) 03/06/2016  . Atrial fibrillation ([place])   . Essential hypertension 01/20/2016  . Faintness 01/20/2016  . Tooth fracture 11/08/2013  . OBESITY-MORBID (>100') 02/03/2009  . HYPERTENSION, BENIGN 02/03/2009    Past Surgical History:  Procedure Laterality Date  . COLPOSCOPY    . [place] RESECTION  9/16   [facility]  . OOPHORECTOMY     Left  . TUBAL LIGATION      OB History    Gravida Para Term Preterm AB Living   2 2 2     2    SAB TAB Ectopic Multiple Live Births                   Home Medications    Prior to Admission medications   Medication Sig Start Date End Date Taking? Authorizing Provider  acetaminophen (TYLENOL) 500 MG tablet Take 1,000 mg by mouth every 6 (six) hours as needed for mild pain, moderate pain, fever or headache.   Yes Historical Provider, MD  calcium-vitamin D (OSCAL WITH D) 500-200 MG-UNIT tablet Take 3 tablets by mouth daily.   Yes Historical Provider, MD  dicyclomine (BENTYL) 20 MG tablet Take 1 tablet (20 mg total) by mouth 2 (two) times daily. 11/20/16  Yes April Palumbo, MD  diltiazem (CARDIZEM) 30 MG tablet Take 30 mg by mouth 2 (two) times daily.    Yes  Historical Provider, MD  IRON PO Take 2 tablets by mouth daily.   Yes Historical Provider, MD  metroNIDAZOLE (FLAGYL) 500 MG tablet Take 1 tablet (500 mg total) by mouth 2 (two) times daily. One po bid x 7 days Patient taking differently: Take 500 mg by mouth 2 (two) times daily. Started 02/12 for 7 days 11/20/16  Yes April Palumbo, MD  Multiple Vitamin (MULTIVITAMIN WITH MINERALS) TABS tablet Take 2 tablets by mouth daily.   Yes Historical Provider, MD  ondansetron (ZOFRAN ODT) 8 MG disintegrating tablet 8mg  ODT q8 hours prn nausea Patient taking differently: Take 8 mg by mouth every 8 (eight) hours as needed for nausea.  11/20/16  Yes April Palumbo, MD  sucralfate (CARAFATE) 1 GM/10ML suspension Take 10 mLs (1 g  total) by mouth 4 (four) times daily -  with meals and at bedtime. 11/20/16  Yes April Palumbo, MD  Levetiracetam 750 MG TB24 Take 3 tablets (2,250 mg total) by mouth daily. 11/30/16   Alda Berthold, DO  magic mouthwash SOLN Take 30 mLs by mouth 3 (three) times daily. Patient not taking: Reported on 11/30/2016 09/06/16   Alda Berthold, DO    Family History Family History  Problem Relation Age of Onset  . Hypertension Father   . Kidney disease Father   . Cancer Father   . Diabetes Father   . Diabetes Maternal Grandmother   . Hypertension Paternal Grandmother   . Epilepsy Cousin   . Epilepsy Maternal Aunt   . Epilepsy Maternal Uncle   . Febrile seizures Son   . Asthma Son     Social History Social History  Substance Use Topics  . Smoking status: Never Smoker  . Smokeless tobacco: Never Used  . Alcohol use No     Comment: drank last night; does not usually drink     Allergies   Sulfa antibiotics; Sulfamethoxazole; and Tape   Review of Systems Review of Systems  Constitutional: Positive for fatigue. Negative for chills and fever.  HENT: Negative for congestion, drooling, hearing loss, sore throat, tinnitus and trouble swallowing.   Eyes: Negative for photophobia, pain and visual disturbance.  Respiratory: Negative for cough, shortness of breath and wheezing.   Cardiovascular: Negative for chest pain and palpitations.  Gastrointestinal: Negative for abdominal pain, diarrhea, nausea and vomiting.  Genitourinary: Positive for frequency. Negative for difficulty urinating, dysuria and urgency.  Musculoskeletal: Positive for myalgias. Negative for back pain and neck pain.  Skin: Negative for rash.  Neurological: Positive for seizures and headaches. Negative for syncope, speech difficulty, weakness and numbness.     Physical Exam Updated Vital Signs BP 115/63   Pulse 72   Temp 98.4 F (36.9 C) (Oral)   Resp 21   Ht 5\' 10"  (1.778 m)   Wt 110.2 kg   LMP 11/22/2016    SpO2 94%   BMI 34.87 kg/m   Physical Exam  Constitutional: She is oriented to person, place, and time. She appears well-developed and well-nourished. No distress.  Nontoxic appearing.  HENT:  Head: Normocephalic and atraumatic.  Right Ear: External ear normal.  Left Ear: External ear normal.  Mouth/Throat: Oropharynx is clear and moist.  No visible signs of head trauma.  Eyes: Conjunctivae and EOM are normal. Pupils are equal, round, and reactive to light. Right eye exhibits no discharge. Left eye exhibits no discharge.  Neck: Normal range of motion. Neck supple.  Cardiovascular: Normal rate, regular rhythm, normal heart sounds and intact distal pulses.  Exam  reveals no gallop and no friction rub.   No murmur heard. Pulmonary/Chest: Effort normal and breath sounds normal. No respiratory distress. She has no wheezes. She has no rales.  Lungs clear auscultation bilaterally.  Abdominal: Soft. There is no tenderness.  Musculoskeletal: Normal range of motion. She exhibits no edema or tenderness.  Lymphadenopathy:    She has no cervical adenopathy.  Neurological: She is alert and oriented to person, place, and time. No cranial nerve deficit or sensory deficit. She exhibits normal muscle tone. Coordination normal.  Patient is alert and oriented 3. Cranial nerves are intact. Speech is clear and coherent. No pronator drip. Finger to nose intact bilaterally. Sensation is intact in her bilateral upper and lower extremities. Good grip strengths bilaterally. Good strength to her bilateral upper and lower extremities. Vision is grossly intact. EOMs are intact.  Skin: Skin is warm and dry. Capillary refill takes less than 2 seconds. No rash noted. She is not diaphoretic. No erythema. No pallor.  Psychiatric: She has a normal mood and affect. Her behavior is normal.  Nursing note and vitals reviewed.    ED Treatments / Results  Labs (all labs ordered are listed, but only abnormal results are  displayed) Labs Reviewed  COMPREHENSIVE METABOLIC PANEL - Abnormal; Notable for the following:       Result Value   Calcium 8.5 (*)    All other components within normal limits  CBC WITH DIFFERENTIAL/PLATELET - Abnormal; Notable for the following:    RBC 3.78 (*)    Hemoglobin 11.1 (*)    HCT 33.6 (*)    All other components within normal limits  URINALYSIS, ROUTINE W REFLEX MICROSCOPIC  CBG MONITORING, ED    EKG  EKG Interpretation None       Radiology No results found.  Procedures Procedures (including critical care time)  Medications Ordered in ED Medications  levETIRAcetam (KEPPRA XR) 24 hr tablet 2,500 mg (2,500 mg Oral Given 11/30/16 1317)  ibuprofen (ADVIL,MOTRIN) tablet 800 mg (800 mg Oral Refused 11/30/16 1301)  acetaminophen (TYLENOL) tablet 650 mg (650 mg Oral Given 11/30/16 1111)     Initial Impression / Assessment and Plan / ED Course  I have reviewed the triage vital signs and the nursing notes.  Pertinent labs & imaging results that were available during my care of the patient were reviewed by me and considered in my medical decision making (see chart for details).    This is a 42 female presenting to the ED post seizure. She was found by her son at 9 am this morning in bed and transported to the ED by EMS.  She is complaining of generalized muscle soreness and lip/tongue pain. Her first seizure occurred some time last year and her most recent seizure was one month ago. Her Keppra dosage was recently changed and she has not yet started taking the adjusted dose. She is followed by neurologist Dr. Posey Pronto. She reports taking Keppra 750 mg nightly. She is supposed to be taking Keppra 2250 mg daily. EMS reports patient was postictal on arrival. Patient is currently being treated for BV with Flagyl. Patient denies recent illness.  On exam the patient is afebrile nontoxic appearing. She has no focal neurological deficits. She is alert and oriented 3. She is not  postictal on my exam. CBC is the patient's baseline. No leukocytosis. CMP is unremarkable. Urinalysis is without sign of infection.  Patient received her appropriate dose of Keppra here in the emergency department. She tells me she has  the prescription at the pharmacy and she can go and pick this up. I encouraged her to do so. I also encouraged her to follow-up with her neurologist. We'll discharge at this time. I advised the patient to follow-up with their primary care provider this week. I advised the patient to return to the emergency department with new or worsening symptoms or new concerns. The patient verbalized understanding and agreement with plan.      Final Clinical Impressions(s) / ED Diagnoses   Final diagnoses:  Seizure [facility])    New Prescriptions New Prescriptions   No medications on file     Waynetta Pean, PA-C 11/30/16 1411    Virgel Manifold, MD 12/16/16 [phone]

## 2016-11-30 NOTE — ED Notes (Signed)
Discharge instructions and follow up care reviewed with patient. Patient verbalized understanding. 

## 2016-11-30 NOTE — ED Notes (Signed)
Patient requesting additional pain medication. ED PA made aware and states he will speak to patient.

## 2016-12-06 ENCOUNTER — Encounter: Payer: Self-pay | Admitting: *Deleted

## 2016-12-15 ENCOUNTER — Telehealth: Payer: Self-pay | Admitting: Neurology

## 2016-12-15 NOTE — Telephone Encounter (Signed)
PT left a message asking if the paperwork she dropped off was ready yet/Dawn CB# [id number]

## 2016-12-15 NOTE — Telephone Encounter (Signed)
Patient informed that paperwork is ready.

## 2016-12-16 ENCOUNTER — Inpatient Hospital Stay (HOSPITAL_COMMUNITY): Payer: Self-pay

## 2016-12-16 ENCOUNTER — Encounter (HOSPITAL_COMMUNITY): Payer: Self-pay | Admitting: *Deleted

## 2016-12-16 ENCOUNTER — Inpatient Hospital Stay (HOSPITAL_COMMUNITY)
Admission: AD | Admit: 2016-12-16 | Discharge: 2016-12-16 | Disposition: A | Discharge status: Home / Self Care | Payer: 59 | Source: Ambulatory Visit | Attending: Obstetrics and Gynecology | Admitting: Obstetrics and Gynecology

## 2016-12-16 ENCOUNTER — Encounter: Payer: Self-pay | Admitting: Obstetrics & Gynecology

## 2016-12-16 DIAGNOSIS — N83201 Unspecified ovarian cyst, right side: Secondary | ICD-10-CM | POA: Diagnosis not present

## 2016-12-16 DIAGNOSIS — Z9884 Bariatric surgery status: Secondary | ICD-10-CM | POA: Insufficient documentation

## 2016-12-16 DIAGNOSIS — Z3202 Encounter for pregnancy test, result negative: Secondary | ICD-10-CM | POA: Insufficient documentation

## 2016-12-16 DIAGNOSIS — R102 Pelvic and perineal pain unspecified side: Secondary | ICD-10-CM

## 2016-12-16 LAB — URINALYSIS, ROUTINE W REFLEX MICROSCOPIC
BILIRUBIN URINE: NEGATIVE
Bacteria, UA: NONE SEEN
Glucose, UA: NEGATIVE mg/dL
Ketones, ur: NEGATIVE mg/dL
LEUKOCYTES UA: NEGATIVE
NITRITE: NEGATIVE
Protein, ur: 100 mg/dL — AB
SPECIFIC GRAVITY, URINE: 1.034 — AB (ref 1.005–1.030)
pH: 5 (ref 5.0–8.0)

## 2016-12-16 LAB — WET PREP, GENITAL
CLUE CELLS WET PREP: NONE SEEN
SPERM: NONE SEEN
Trich, Wet Prep: NONE SEEN
Yeast Wet Prep HPF POC: NONE SEEN

## 2016-12-16 LAB — POCT PREGNANCY, URINE: PREG TEST UR: NEGATIVE

## 2016-12-16 LAB — CBC
HEMATOCRIT: 35.1 % — AB (ref 36.0–46.0)
Hemoglobin: 11.3 g/dL — ABNORMAL LOW (ref 12.0–15.0)
MCH: 28.9 pg (ref 26.0–34.0)
MCHC: 32.2 g/dL (ref 30.0–36.0)
MCV: 89.8 fL (ref 78.0–100.0)
Platelets: 272 10*3/uL (ref 150–400)
RBC: 3.91 MIL/uL (ref 3.87–5.11)
RDW: 13.1 % (ref 11.5–15.5)
WBC: 8.5 10*3/uL (ref 4.0–10.5)

## 2016-12-16 MED ORDER — ONDANSETRON 8 MG PO TBDP
8.0000 mg | ORAL_TABLET | Freq: Once | ORAL | Status: AC
  Administered 2016-12-16: 8 mg via ORAL
  Filled 2016-12-16: qty 1

## 2016-12-16 MED ORDER — KETOROLAC TROMETHAMINE 60 MG/2ML IM SOLN
60.0000 mg | Freq: Once | INTRAMUSCULAR | Status: AC
  Administered 2016-12-16: 60 mg via INTRAMUSCULAR
  Filled 2016-12-16: qty 2

## 2016-12-16 MED ORDER — PROMETHAZINE HCL 25 MG PO TABS: 12.5000 mg | ORAL_TABLET | Freq: Four times a day (QID) | ORAL | 0 refills | Status: AC | PRN

## 2016-12-16 NOTE — MAU Note (Signed)
Pt presents complaining of generalized abdominal pain in the front and back pain that is constant and comes with her periods. Thinks it is related to her fibroids. LMP 12/15/16. Hx of fibroids and hypertension. Tried tylenol, ibuprofen, aspirin for the pain.

## 2016-12-16 NOTE — Discharge Instructions (Signed)
Ovarian Cyst °An ovarian cyst is a fluid-filled sac on an ovary. The ovaries are organs that make eggs in women. Most ovarian cysts go away on their own and are not cancerous (are benign). Some cysts need treatment. °Follow these instructions at home: °· Take over-the-counter and prescription medicines only as told by your doctor. °· Do not drive or use heavy machinery while taking prescription pain medicine. °· Get pelvic exams and Pap tests as often as told by your doctor. °· Return to your normal activities as told by your doctor. Ask your doctor what activities are safe for you. °· Do not use any products that contain nicotine or tobacco, such as cigarettes and e-cigarettes. If you need help quitting, ask your doctor. °· Keep all follow-up visits as told by your doctor. This is important. °Contact a doctor if: °· Your periods are: °¨ Late. °¨ Irregular. °¨ Painful. °· Your periods stop. °· You have pelvic pain that does not go away. °· You have pressure on your bladder. °· You have trouble making your bladder empty when you pee (urinate). °· You have pain during sex. °· You have any of the following in your belly (abdomen): °¨ A feeling of fullness. °¨ Pressure. °¨ Discomfort. °¨ Pain that does not go away. °¨ Swelling. °· You feel sick most of the time. °· You have trouble pooping (have constipation). °· You are not as hungry as usual (you lose your appetite). °· You get very bad acne. °· You start to have more hair on your body and face. °· You are gaining weight or losing weight without changing your exercise and eating habits. °· You think you may be pregnant. °Get help right away if: °· You have belly pain that is very bad or gets worse. °· You cannot eat or drink without throwing up (vomiting). °· You suddenly get a fever. °· Your period is a lot heavier than usual. °This information is not intended to replace advice given to you by your health care provider. Make sure you discuss any questions you have  with your health care provider. °Document Released: 03/14/2008 Document Revised: 04/15/2016 Document Reviewed: 02/28/2016 °Elsevier Interactive Patient Education © 2017 [employer] Inc[employer] ° °

## 2016-12-16 NOTE — MAU Provider Note (Signed)
History     CSN: [id number]  Arrival date and time: 12/16/16 0244   First Provider Initiated Contact with Patient 12/16/16 [phone]      Chief Complaint  Patient presents with  . Abdominal Pain   Tammie [name] is a 41 y.o. [id number] who presents today with lower abdominal pain. She states that when she was seen in February she was told she had a Fibroid. Since then she has had a lot of pain. She reports that her period started yesterday, and the pain started around that time too.    Pelvic Pain  The patient's primary symptoms include pelvic pain and vaginal bleeding. The patient's pertinent negatives include no vaginal discharge. This is a new problem. The current episode started yesterday. The problem occurs constantly. The problem has been unchanged. Pain severity now: 10/10. The problem affects both sides. She is not pregnant. Associated symptoms include constipation and nausea. Pertinent negatives include no chills, diarrhea, dysuria, hematuria, urgency or vomiting. The vaginal discharge was bloody. The vaginal bleeding is typical of menses. She has not been passing clots. She has not been passing tissue. Exacerbated by: pain started around the time her period started yesterday  She has tried acetaminophen for the symptoms. The treatment provided no relief. Menstrual history: LMP 12/15/16.    Past Medical History:  Diagnosis Date  . Abnormal Pap smear   . Anemia   . Aortic root enlargement ([place]) 5/17   5.1cm thoracic aorta by echo  . Ascending aortic aneurysm ([place])   . Asthma    as child  . Esophageal reflux   . Fibroid   . Hypertension   . Hypopotassemia   . Obesity    s/p gastric sleeve  . Palpitations   . Paroxysmal atrial fibrillation (HCC)   . Pregnancy induced hypertension   . Premature ventricular contraction   . Seizure [facility])     Past Surgical History:  Procedure Laterality Date  . COLPOSCOPY    . [place] RESECTION  9/16   [facility]  . OOPHORECTOMY     Left  . TUBAL LIGATION      Family History  Problem Relation Age of Onset  . Hypertension Father   . Kidney disease Father   . Cancer Father   . Diabetes Father   . Diabetes Maternal Grandmother   . Hypertension Paternal Grandmother   . Epilepsy Cousin   . Epilepsy Maternal Aunt   . Epilepsy Maternal Uncle   . Febrile seizures Son   . Asthma Son     Social History  Substance Use Topics  . Smoking status: Never Smoker  . Smokeless tobacco: Never Used  . Alcohol use No     Comment: drank last night; does not usually drink    Allergies:  Allergies  Allergen Reactions  . Sulfa Antibiotics Anaphylaxis, Rash and Other (See Comments)    "Burning"  . Sulfamethoxazole Anaphylaxis, Rash and Other (See Comments)    burning  . Tape Rash    Leads on heart monitor have caused a rash    Prescriptions Prior to Admission  Medication Sig Dispense Refill Last Dose  . acetaminophen (TYLENOL) 500 MG tablet Take 1,000 mg by mouth every 6 (six) hours as needed for mild pain, moderate pain, fever or headache.   11/29/2016 at Unknown time  . calcium-vitamin D (OSCAL WITH D) 500-200 MG-UNIT tablet Take 3 tablets by mouth daily.   11/29/2016 at Unknown time  . dicyclomine (BENTYL) 20  MG tablet Take 1 tablet (20 mg total) by mouth 2 (two) times daily. 20 tablet 0 11/29/2016 at Unknown time  . diltiazem (CARDIZEM) 30 MG tablet Take 30 mg by mouth 2 (two) times daily.    11/29/2016 at Unknown time  . IRON PO Take 2 tablets by mouth daily.   11/29/2016 at Unknown time  . Levetiracetam 750 MG TB24 Take 3 tablets (2,250 mg total) by mouth daily. 90 tablet 5   . magic mouthwash SOLN Take 30 mLs by mouth 3 (three) times daily. (Patient not taking: Reported on 11/30/2016) 630 mL 2 Not Taking at Unknown time  . metroNIDAZOLE (FLAGYL) 500 MG tablet Take 1 tablet (500 mg total) by mouth 2 (two) times daily. One po bid x 7 days (Patient taking differently: Take 500 mg by mouth 2 (two)  times daily. Started 02/12 for 7 days) 14 tablet 0 11/29/2016 at Unknown time  . Multiple Vitamin (MULTIVITAMIN WITH MINERALS) TABS tablet Take 2 tablets by mouth daily.   11/29/2016 at Unknown time  . ondansetron (ZOFRAN ODT) 8 MG disintegrating tablet 8mg  ODT q8 hours prn nausea (Patient taking differently: Take 8 mg by mouth every 8 (eight) hours as needed for nausea. ) 4 tablet 0 11/29/2016 at Unknown time  . sucralfate (CARAFATE) 1 GM/10ML suspension Take 10 mLs (1 g total) by mouth 4 (four) times daily -  with meals and at bedtime. 420 mL 0 11/29/2016 at Unknown time    Review of Systems  Constitutional: Negative for chills.  Gastrointestinal: Positive for constipation and nausea. Negative for diarrhea and vomiting.  Genitourinary: Positive for pelvic pain and vaginal bleeding (currently on period. LMP 12/15/16 ). Negative for dysuria, hematuria, urgency and vaginal discharge.   Physical Exam   Blood pressure 162/100, pulse 94, temperature 98 F (36.7 C), temperature source Oral, resp. rate 18, last menstrual period 12/15/2016.  Physical Exam  Nursing note and vitals reviewed. Constitutional: She is oriented to person, place, and time. She appears well-developed and well-nourished. No distress.  HENT:  Head: Normocephalic.  Cardiovascular: Normal rate.   Respiratory: Effort normal.  GI: Soft. There is no tenderness. There is no rebound.  Neurological: She is alert and oriented to person, place, and time.  Skin: Skin is warm and dry.  Psychiatric: She has a normal mood and affect.   Results for orders placed or performed during the hospital encounter of 12/16/16 (from the past 24 hour(s))  Urinalysis, Routine w reflex microscopic     Status: Abnormal   Collection Time: 12/16/16  2:50 AM  Result Value Ref Range   Color, Urine YELLOW YELLOW   APPearance CLOUDY (A) CLEAR   Specific Gravity, Urine 1.034 (H) 1.005 - 1.030   pH 5.0 5.0 - 8.0   Glucose, UA NEGATIVE NEGATIVE mg/dL   Hgb  urine dipstick LARGE (A) NEGATIVE   Bilirubin Urine NEGATIVE NEGATIVE   Ketones, ur NEGATIVE NEGATIVE mg/dL   Protein, ur 100 (A) NEGATIVE mg/dL   Nitrite NEGATIVE NEGATIVE   Leukocytes, UA NEGATIVE NEGATIVE   RBC / HPF TOO NUMEROUS TO COUNT 0 - 5 RBC/hpf   WBC, UA 0-5 0 - 5 WBC/hpf   Bacteria, UA NONE SEEN NONE SEEN   Squamous Epithelial / LPF 6-30 (A) NONE SEEN   Mucous PRESENT   CBC     Status: Abnormal   Collection Time: 12/16/16  3:22 AM  Result Value Ref Range   WBC 8.5 4.0 - 10.5 K/uL   RBC 3.91 3.87 -  5.11 MIL/uL   Hemoglobin 11.3 (L) 12.0 - 15.0 g/dL   HCT 35.1 (L) 36.0 - 46.0 %   MCV 89.8 78.0 - 100.0 fL   MCH 28.9 26.0 - 34.0 pg   MCHC 32.2 30.0 - 36.0 g/dL   RDW 13.1 11.5 - 15.5 %   Platelets 272 150 - 400 K/uL  Pregnancy, urine POC     Status: None   Collection Time: 12/16/16  3:29 AM  Result Value Ref Range   Preg Test, Ur NEGATIVE NEGATIVE  Wet prep, genital     Status: Abnormal   Collection Time: 12/16/16  3:30 AM  Result Value Ref Range   Yeast Wet Prep HPF POC NONE SEEN NONE SEEN   Trich, Wet Prep NONE SEEN NONE SEEN   Clue Cells Wet Prep HPF POC NONE SEEN NONE SEEN   WBC, Wet Prep HPF POC FEW (A) NONE SEEN   Sperm NONE SEEN    Results for orders placed or performed during the hospital encounter of 12/16/16 (from the past 24 hour(s))  Urinalysis, Routine w reflex microscopic     Status: Abnormal   Collection Time: 12/16/16  2:50 AM  Result Value Ref Range   Color, Urine YELLOW YELLOW   APPearance CLOUDY (A) CLEAR   Specific Gravity, Urine 1.034 (H) 1.005 - 1.030   pH 5.0 5.0 - 8.0   Glucose, UA NEGATIVE NEGATIVE mg/dL   Hgb urine dipstick LARGE (A) NEGATIVE   Bilirubin Urine NEGATIVE NEGATIVE   Ketones, ur NEGATIVE NEGATIVE mg/dL   Protein, ur 100 (A) NEGATIVE mg/dL   Nitrite NEGATIVE NEGATIVE   Leukocytes, UA NEGATIVE NEGATIVE   RBC / HPF TOO NUMEROUS TO COUNT 0 - 5 RBC/hpf   WBC, UA 0-5 0 - 5 WBC/hpf   Bacteria, UA NONE SEEN NONE SEEN    Squamous Epithelial / LPF 6-30 (A) NONE SEEN   Mucous PRESENT   CBC     Status: Abnormal   Collection Time: 12/16/16  3:22 AM  Result Value Ref Range   WBC 8.5 4.0 - 10.5 K/uL   RBC 3.91 3.87 - 5.11 MIL/uL   Hemoglobin 11.3 (L) 12.0 - 15.0 g/dL   HCT 35.1 (L) 36.0 - 46.0 %   MCV 89.8 78.0 - 100.0 fL   MCH 28.9 26.0 - 34.0 pg   MCHC 32.2 30.0 - 36.0 g/dL   RDW 13.1 11.5 - 15.5 %   Platelets 272 150 - 400 K/uL  Pregnancy, urine POC     Status: None   Collection Time: 12/16/16  3:29 AM  Result Value Ref Range   Preg Test, Ur NEGATIVE NEGATIVE  Wet prep, genital     Status: Abnormal   Collection Time: 12/16/16  3:30 AM  Result Value Ref Range   Yeast Wet Prep HPF POC NONE SEEN NONE SEEN   Trich, Wet Prep NONE SEEN NONE SEEN   Clue Cells Wet Prep HPF POC NONE SEEN NONE SEEN   WBC, Wet Prep HPF POC FEW (A) NONE SEEN   Sperm NONE SEEN     MAU Course  Procedures  MDM  Patient has had toradol for pain. She reports that her pain has improved.  She would like something for nausea prior to DC.Will get zofran prior to dc.  Assessment and Plan   1. Right ovarian cyst   2. Pelvic pain    DC home Comfort measures reviewed  RX: phenergan PRN #[id number]  Return to MAU as needed FU with OB  as planned  [place] for [place] Follow up.   Specialty:  Obstetrics and Gynecology Contact information: [place] [phone]           Mathis Bud 12/16/2016, 3:08 AM

## 2016-12-19 LAB — GC/CHLAMYDIA PROBE AMP (~~LOC~~) NOT AT ARMC
Chlamydia: NEGATIVE
NEISSERIA GONORRHEA: NEGATIVE

## 2016-12-30 ENCOUNTER — Ambulatory Visit: Payer: Self-pay | Admitting: Obstetrics & Gynecology

## 2017-01-05 ENCOUNTER — Ambulatory Visit: Payer: Self-pay | Admitting: Obstetrics & Gynecology

## 2017-01-09 DIAGNOSIS — Z736 Limitation of activities due to disability: Secondary | ICD-10-CM

## 2017-02-15 ENCOUNTER — Telehealth: Payer: Self-pay | Admitting: Neurology

## 2017-02-15 NOTE — Telephone Encounter (Signed)
Patient called. She just believes she had a seizure. She states she remembers that she was in the living room, then she was in the bedroom. Her body is sore and feels like she bit her lip. She does not have any other injuries. Did not hit her head that she knows of. She has not missed any doses of Keppra.  I advised them to keep an eye on patient and we would call back in the morning. Dr. Posey Pronto please advise.

## 2017-02-16 ENCOUNTER — Other Ambulatory Visit: Payer: Self-pay | Admitting: *Deleted

## 2017-02-16 DIAGNOSIS — G40909 Epilepsy, unspecified, not intractable, without status epilepticus: Secondary | ICD-10-CM

## 2017-02-16 DIAGNOSIS — Z79899 Other long term (current) drug therapy: Secondary | ICD-10-CM

## 2017-02-16 NOTE — Telephone Encounter (Signed)
Patient coming in tomorrow for lab work.

## 2017-02-16 NOTE — Telephone Encounter (Signed)
Please have her come in for levetiracetam levels to be checked.  We can determine medication changes based on these results.  Please encouraged her to come to a follow-up appointment next week.  Donika K. Posey Pronto, DO

## 2017-02-17 ENCOUNTER — Other Ambulatory Visit: Payer: BLUE CROSS/BLUE SHIELD

## 2017-02-17 DIAGNOSIS — G40909 Epilepsy, unspecified, not intractable, without status epilepticus: Secondary | ICD-10-CM

## 2017-02-17 DIAGNOSIS — Z79899 Other long term (current) drug therapy: Secondary | ICD-10-CM

## 2017-02-21 LAB — LEVETIRACETAM LEVEL: Keppra (Levetiracetam): 15.2 ug/mL

## 2017-02-22 ENCOUNTER — Ambulatory Visit (INDEPENDENT_AMBULATORY_CARE_PROVIDER_SITE_OTHER): Payer: BLUE CROSS/BLUE SHIELD | Admitting: Neurology

## 2017-02-22 ENCOUNTER — Encounter: Payer: Self-pay | Admitting: Neurology

## 2017-02-22 VITALS — BP 120/78 | HR 75 | Ht 70.0 in | Wt 264.5 lb

## 2017-02-22 DIAGNOSIS — G40909 Epilepsy, unspecified, not intractable, without status epilepticus: Secondary | ICD-10-CM | POA: Diagnosis not present

## 2017-02-22 MED ORDER — LEVETIRACETAM ER 750 MG PO TB24: 3000.0000 mg | ORAL_TABLET | Freq: Every day | ORAL | 5 refills | Status: AC

## 2017-02-22 NOTE — Patient Instructions (Signed)
1.  Increase Keppra to (4) tablets daily = 3000mg /day 2.  Ambulatory EEG 3.  Return to clinic in 3-4 months

## 2017-02-22 NOTE — Progress Notes (Signed)
Follow-up Visit   Date: 02/22/17    Tammie [name] MRN: [id number] DOB: July 15, 1976   Interim History: Tammie [name] is a 41 y.o. right-handed African American female with obesity s/p gastric sleeve with 57lb weight loss (October 2016) returning to the clinic for follow-up of seizure disorder.  The patient was accompanied to the clinic by mother.  History of present illness: She had her first seizure in April 2017.  Her sons had noticed that she was not awake by 10am which is very unusual for her so went to check on her and her eldest son found her in grunting in her bed and staring at him.  She was not responding to him and was very sleepy. Her does not recall seeing abnormal movements, but states that it took about 2 minutes for her to respond and answer questions.  She is amnestic of the event.  There was tongue biting and urinary incontinence.  She recalls having severe body aches and confused afterwards. Her son called EMS where she was evaluated in the ED.  She had another spell on May 28th, where again, she was found by her mother unconscious in her bed, again with urinary incontinence, and woke up on a bloody pillow.  On event, she recalls having a racing heart rate.  Duration of these spells is unknown since it occurs at night.  She reports being extremely tired with generalized myalgias.  She was admitted to [facility] on 5/28 - 03/08/2016 for evaluation. When she presented to the ER, patient was somnolent and noted to be in atrial fibrillation, which self converted later that day. She underwent MRI brain which was normal and started on Keppra 500mg  twice daily.  She gets tired with Keppra, but it is less so than when she first started the medication.    She reports taking verapamil three times daily for blood pressure but since loosing 57lb her blood pressure has normalized.  She feels that her blood pressure was getting too low at night time which may have  triggered her seizures.   Seizure triggers include stress.  For the past two months, she has been working overtime as a Engineer, manufacturing.  UPDATE 05/31/2016:   Her last seizure occurred on July 14th in the setting of skipping her Keppra because she was in denial that she actually has seizures and needed medication.  Since being compliant with her medications, she has been seizure-free on Keppra 750mg  twice daily.  The medication makes her a little sleepy, but otherwise is tolerating it well.  Unfortunately, she has had a number of other medical comorbidities including newly found bicuspid aortic valave and dilated ascending aorta.   UPDATE 08/08/2016: Starting in the end of September, she self-tapered her medication to [place] 750mg  at bedtime only.  She says that the medication gives her insomnia and she feels fatigued all the time.  Upon further questioning, she also reports having a lot of anxiety with her new medical problems. I had sent her to behavior therapy because of these complaints at her last visit and they recommended two-week out-patient program, but she was unable to do this as she was working.    In October, she had two back to back breakthrough seizures lasting 1-3 minutes.  Her mother called EMS who transferred her to the [facility] ER.  She did not disclose that she was noncomplaint with her medications, and was instructed to increase her Keppra to 1000mg  BID.  She has  not been taking this dose and is still taking 750mg  at bedtime only.    UPDATE 10/24/2016:   She reports to being compliant with Keppra 1000mg  twice daily, but reports to having 3-4 seizures despite this.  She does not want to take any medications and is not happy with taking Keppra.  She continues to feel that she is having seizures because of her AAA.  She is no longer working.  She appears very depressed and does not engage in conversations.   UPDATE 02/22/2017:  After we increased her Keppra at her last visit, she  did well for 6 weeks and did not have any seizures.  However, since March, she has had three breakthrough seizures. Last week, she was at home alone and recalls not feeling well, lightheadedness, and calling her boyfriend, but he states that when she called, she did not speak.  When he arrived home, she was unresponsive and home was in disarray.  She chewed her lower lip and injured her arm and forehead.  She was terminated from work in February and has been recently stressed due to loosing her insurance.  Her keppra level was checked yesterday and within normal limits (15).  Medications:  Current Outpatient Prescriptions on File Prior to Visit  Medication Sig Dispense Refill  . acetaminophen (TYLENOL) 500 MG tablet Take 1,000 mg by mouth every 6 (six) hours as needed for mild pain, moderate pain, fever or headache.    . calcium-vitamin D (OSCAL WITH D) 500-200 MG-UNIT tablet Take 3 tablets by mouth daily.    [place] dicyclomine (BENTYL) 20 MG tablet Take 1 tablet (20 mg total) by mouth 2 (two) times daily. 20 tablet 0  . diltiazem (CARDIZEM) 30 MG tablet Take 30 mg by mouth 2 (two) times daily.     . IRON PO Take 2 tablets by mouth daily.    . magic mouthwash SOLN Take 30 mLs by mouth 3 (three) times daily. 630 mL 2  . metroNIDAZOLE (FLAGYL) 500 MG tablet Take 1 tablet (500 mg total) by mouth 2 (two) times daily. One po bid x 7 days (Patient taking differently: Take 500 mg by mouth 2 (two) times daily. Started 02/12 for 7 days) 14 tablet 0  . Multiple Vitamin (MULTIVITAMIN WITH MINERALS) TABS tablet Take 2 tablets by mouth daily.    . ondansetron (ZOFRAN ODT) 8 MG disintegrating tablet 8mg  ODT q8 hours prn nausea (Patient taking differently: Take 8 mg by mouth every 8 (eight) hours as needed for nausea. ) 4 tablet 0  . promethazine (PHENERGAN) 25 MG tablet Take 0.5-1 tablets (12.5-25 mg total) by mouth every 6 (six) hours as needed. 30 tablet 0  . sucralfate (CARAFATE) 1 GM/10ML suspension Take 10 mLs (1 g  total) by mouth 4 (four) times daily -  with meals and at bedtime. 420 mL 0   No current facility-administered medications on file prior to visit.     Allergies:  Allergies  Allergen Reactions  . Sulfa Antibiotics Anaphylaxis, Rash and Other (See Comments)    "Burning"  . Sulfamethoxazole Anaphylaxis, Rash and Other (See Comments)    burning  . Tape Rash    Leads on heart monitor have caused a rash    Review of Systems:  CONSTITUTIONAL: No fevers, chills, night sweats, or weight loss.  EYES: No visual changes or eye pain ENT: No hearing changes.  No history of nose bleeds.   RESPIRATORY: No cough, wheezing and shortness of breath.   CARDIOVASCULAR: Negative for  chest pain, and palpitations.   GI: Negative for abdominal discomfort, blood in stools or black stools.  No recent change in bowel habits.   GU:  No history of incontinence.   MUSCLOSKELETAL: No history of joint pain or swelling.  No myalgias.   SKIN: Negative for lesions, rash, and itching.   ENDOCRINE: Negative for cold or heat intolerance, polydipsia or goiter.   PSYCH:  + depression + anxiety symptoms.   NEURO: As Above.   Vital Signs:  BP 120/78   Pulse 75   Ht 5\' 10"  (1.778 m)   Wt 264 lb 8 oz (120 kg)   BMI 37.95 kg/m   Neurological Exam:   General:  Well-appearing Cranial nerves:  Pupils round and reactive, extraocular muscles, face is symmetric. Motor:  5/5 throughout Gait:  Gait is normal.  Data:   MRI brain wo contrast 03/06/2016:  Motion degraded unenhanced examination without seizure focus identified.   Routine EEG 03/21/2016:  Normal  Lab Results  Component Value Date   ALT 17 11/30/2016   AST 17 11/30/2016   ALKPHOS 44 11/30/2016   BILITOT 0.4 11/30/2016    IMPRESSION/PLAN: Generalized seizure disorder with breakthrough seizures.   - First seizure April 20017 and occurring once every 1-2 months, manifesting with LOC, tonic-clonic movements, tongue biting, incontinence, and postictal  confusion and fatigue  - MRI brain and routine EEG was normal  - Stressed the importance of medication compliance as she previously was having increased frequency of seizures due to medication noncompliance, but she is much better about taking medications as prescribed now  - With her ongoing break through seizures, she will be set up for ambulatory EEG  - Increase Keppra ER 3000mg /d - take (4) 750mg  tab daily.  If she does not tolerate the medication or continues to have side effects, she will need to be transitioned to lamictal.  - No driving until seizure free for 31-months  Return to clinic in 4 months  The duration of this appointment visit was 25 minutes of face-to-face time with the patient.  Greater than 50% of this time was spent in counseling, explanation of diagnosis, planning of further management, and coordination of care.   Thank you for allowing me to participate in patient's care.  If I can answer any additional questions, I would be pleased to do so.    Sincerely,    Donika K. Posey Pronto, DO

## 2017-03-01 ENCOUNTER — Encounter: Payer: Self-pay | Admitting: Nurse Practitioner

## 2017-03-01 ENCOUNTER — Ambulatory Visit (INDEPENDENT_AMBULATORY_CARE_PROVIDER_SITE_OTHER): Payer: BLUE CROSS/BLUE SHIELD | Admitting: Nurse Practitioner

## 2017-03-01 VITALS — BP 120/80 | HR 94 | Ht 70.0 in | Wt 263.8 lb

## 2017-03-01 DIAGNOSIS — I712 Thoracic aortic aneurysm, without rupture, unspecified: Secondary | ICD-10-CM

## 2017-03-01 DIAGNOSIS — I480 Paroxysmal atrial fibrillation: Secondary | ICD-10-CM

## 2017-03-01 DIAGNOSIS — Q231 Congenital insufficiency of aortic valve: Secondary | ICD-10-CM

## 2017-03-01 DIAGNOSIS — I10 Essential (primary) hypertension: Secondary | ICD-10-CM | POA: Diagnosis not present

## 2017-03-01 DIAGNOSIS — I519 Heart disease, unspecified: Secondary | ICD-10-CM | POA: Diagnosis not present

## 2017-03-01 DIAGNOSIS — I5189 Other ill-defined heart diseases: Secondary | ICD-10-CM

## 2017-03-01 MED ORDER — AMOXICILLIN 500 MG PO TABS: 500.0000 mg | ORAL_TABLET | Freq: Two times a day (BID) | ORAL | 0 refills | Status: AC

## 2017-03-01 NOTE — Patient Instructions (Addendum)
We will be checking the following labs today - NONE   Medication Instructions:    Continue with your current medicines.   I have sent in a RX for Amoxicillin 500 mg to take twice a day for one week  DO NOT LET ANYONE GIVE YOU A PRESCRIPTION FOR CIPRO/OR AGENT LIKE CIPRO    Testing/Procedures To Be Arranged:  Echocardiogram  Follow-Up:   See me in 6 months     Other Special Instructions:   Call your dentist and see if they will see you.   There are dental clinics at [facility] and at [facility]    If you need a refill on your cardiac medications before your next appointment, please call your pharmacy.   Call the [place] office at [phone] if you have any questions, problems or concerns.

## 2017-03-01 NOTE — Progress Notes (Signed)
CARDIOLOGY OFFICE NOTE  Date:  03/01/2017    Tammie [name] Date of Birth: 06-Apr-1976 Medical Record #[id number]  PCP:  Octavio Graves, DO  Cardiologist:  [place]    Chief Complaint  Patient presents with  . Cardiac Valve Problem    Follow up visit - seen for Dr. Rayann Heman    History of Present Illness: Tammie [name] is a 41 y.o. female who presents today for a follow up visit.  Former patient of Dr. Winnifred Friar. She sees Dr. Rayann Heman for PAF and primarily follows with me.   She has a history of HTN, morbid obesity, seizure disorder and PVCs. She has been found to have a bicuspid aortic valve and dilated ascending aorta and was referred to [facility] - seeing EBG. She has had successful weight loss following gastric sleeve.   She has been evaluated by EP after being found to have NSVT and afib on 30 day monitor. She reportedinitially having tachycardia diagnosed in her 65s after having tachypalpitations. She has been treatedwith verapamil.  She had had a seizure and while recovering from this episode, she had abrupt heart "racing" and was brought to [facility] where she was found to have atrial fibrillation. Her atrial fibrillation stopped on its own. Repeat event monitor was negative back in June of 2017. Has been referred for anemia.   ER visit back in July of 2017 for chest pain - had CT scan - negative for dissection.  I saw her back in early August - continued to have seizures - on higher doses of Keppra - she has continued to question this diagnosis - wonders if it was precipitated by low BP. She was currently out of work and is not allowed to go back until she has been seizure free for 30 days. Had lots of questions about her aneurysm - wanting to get pregnant - not using any birth control and was sexually active.  She was very upset about being told to not get pregnant. Discussed her lifting restrictions which clearly interfered with her job. 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   I last saw her back in October. Lots of frustration regarding her job - despite having her restrictions spelled out numerous times by both me and Dr. Servando Snare. No PCP.   Comes in today. Here alone. She was terminated from her job. Has lost her insurance but has [place]. She is back in school however - going to get her degree in counseling.  Looks like she has had several ER visits over the past few months. Has had more seizures as well - her dose of Keppra has been increased. This makes her sleepy. She has had some accommodations made at school and she is doing well with this. HR is up today. EKG ok. No palpitations. No chest pain. She has gained weight. She notes she probably has an infected tooth - the left side of her jaw is sore to touch. No fever. She can take PCN.  Past Medical History:  Diagnosis Date  . Abnormal Pap smear   . Anemia   . Aortic root enlargement ([place]) 5/17   5.1cm thoracic aorta by echo  . Ascending aortic aneurysm ([place])   . Asthma    as child  . Esophageal reflux   . Fibroid   . Hypertension   . Hypopotassemia   . Obesity    s/p gastric sleeve  . Palpitations   .  Paroxysmal atrial fibrillation (HCC)   . Pregnancy induced hypertension   . Premature ventricular contraction   . Seizure [facility])     Past Surgical History:  Procedure Laterality Date  . COLPOSCOPY    . [place] RESECTION  9/16   [facility]  . OOPHORECTOMY     Left  . TUBAL LIGATION       Medications: Current Outpatient Prescriptions  Medication Sig Dispense Refill  . acetaminophen (TYLENOL) 500 MG tablet Take 1,000 mg by mouth every 6 (six) hours as needed for mild pain, moderate pain, fever or headache.    . calcium-vitamin D (OSCAL WITH D) 500-200 MG-UNIT tablet Take 3 tablets by mouth daily.    Marland Kitchen dicyclomine (BENTYL) 20 MG tablet Take 1 tablet (20 mg total) by mouth 2 (two) times daily. 20 tablet 0  .  diltiazem (CARDIZEM) 30 MG tablet Take 30 mg by mouth 2 (two) times daily.     . IRON PO Take 2 tablets by mouth daily.    . Levetiracetam 750 MG TB24 Take 4 tablets (3,000 mg total) by mouth daily. 120 tablet 5  . magic mouthwash SOLN Take 30 mLs by mouth 3 (three) times daily. 630 mL 2  . metroNIDAZOLE (FLAGYL) 500 MG tablet Take 1 tablet (500 mg total) by mouth 2 (two) times daily. One po bid x 7 days (Patient taking differently: Take 500 mg by mouth 2 (two) times daily. Started 02/12 for 7 days) 14 tablet 0  . Multiple Vitamin (MULTIVITAMIN WITH MINERALS) TABS tablet Take 2 tablets by mouth daily.    Marland Kitchen omeprazole (PRILOSEC) 20 MG capsule Take 20 mg by mouth.    . ondansetron (ZOFRAN ODT) 8 MG disintegrating tablet 8mg  ODT q8 hours prn nausea (Patient taking differently: Take 8 mg by mouth every 8 (eight) hours as needed for nausea. ) 4 tablet 0  . promethazine (PHENERGAN) 25 MG tablet Take 0.5-1 tablets (12.5-25 mg total) by mouth every 6 (six) hours as needed. 30 tablet 0  . amoxicillin (AMOXIL) 500 MG tablet Take 1 tablet (500 mg total) by mouth 2 (two) times daily. 14 tablet 0   No current facility-administered medications for this visit.     Allergies: Allergies  Allergen Reactions  . Sulfa Antibiotics Anaphylaxis, Rash and Other (See Comments)    "Burning"  . Sulfamethoxazole Anaphylaxis, Rash and Other (See Comments)    burning  . Tape Rash    Leads on heart monitor have caused a rash    Social History: The patient  reports that she has never smoked. She has never used smokeless tobacco. She reports that she does not drink alcohol or use drugs.   Family History: The patient's family history includes Asthma in her son; Cancer in her father; Diabetes in her father and maternal grandmother; Epilepsy in her cousin, maternal aunt, and maternal uncle; Febrile seizures in her son; Hypertension in her father and paternal grandmother; Kidney disease in her father.   Review of  Systems: Please see the history of present illness.   Otherwise, the review of systems is positive for none.   All other systems are reviewed and negative.   Physical Exam: VS:  BP 120/80 (BP Location: Left Arm, Patient Position: Sitting, Cuff Size: Large)   Pulse 94   Ht 5\' 10"  (1.778 m)   Wt 263 lb 12.8 oz (119.7 kg)   SpO2 100% Comment: at rest  BMI 37.85 kg/m  .  BMI Body mass index is  37.85 kg/m.  Wt Readings from Last 3 Encounters:  03/01/17 263 lb 12.8 oz (119.7 kg)  02/22/17 264 lb 8 oz (120 kg)  11/30/16 243 lb (110.2 kg)    General: Pleasant. Well developed, well nourished and in no acute distress. She has gained weight (up from 248) since my last visit.   HEENT: Normal.  Neck: Supple, no JVD, carotid bruits, or masses noted.  Cardiac: Regular rate and rhythm. No murmurs, rubs, or gallops. No edema.  Respiratory:  Lungs are clear to auscultation bilaterally with normal work of breathing.  GI: Soft and nontender.  MS: No deformity or atrophy. Gait and ROM intact.  Skin: Warm and dry. Color is normal.  Neuro:  Strength and sensation are intact and no gross focal deficits noted.  Psych: Alert, appropriate and with normal affect.   LABORATORY DATA:  EKG:  EKG is ordered today. This shows NSR today. Her HR is 94 today.   Lab Results  Component Value Date   WBC 8.5 12/16/2016   HGB 11.3 (L) 12/16/2016   HCT 35.1 (L) 12/16/2016   PLT 272 12/16/2016   GLUCOSE 87 11/30/2016   CHOL 183 06/18/2013   TRIG 75.0 06/18/2013   HDL 42.60 06/18/2013   LDLCALC 125 (H) 06/18/2013   ALT 17 11/30/2016   AST 17 11/30/2016   NA 137 11/30/2016   K 3.9 11/30/2016   CL 105 11/30/2016   CREATININE 0.58 11/30/2016   BUN 10 11/30/2016   CO2 27 11/30/2016   TSH 1.52 07/18/2016   HGBA1C 4.7 11/16/2011    BNP (last 3 results) No results for input(s): BNP in the last 8760 hours.  ProBNP (last 3 results) No results for input(s): PROBNP in the last 8760 hours.   Other Studies  Reviewed Today:  Event monitor Study Highlights June 2017  Predominant rhythm is sinus rhythm No sustained arrhythmias  3 symptomatic transmissions were recorded. 1 corresponded to sinus rhythm, 1 corresponded to sinus rhythm with very frequent premature atrial contractions, and 1 corresponded to sinus rhythm with 10 beats of nonsustained ventricular tachycardia  No other ventricular ectopy recorded     ECHO 02/2016 Study Conclusions  - Left ventricle: The cavity size was normal. Wall thickness was  normal. Systolic function was normal. The estimated ejection  fraction was in the range of 50% to 55%. Wall motion was normal;  there were no regional wall motion abnormalities. Features are  consistent with a pseudonormal left ventricular filling pattern,  with concomitant abnormal relaxation and increased filling  pressure (grade 2 diastolic dysfunction). - Left atrium: The atrium was mildly dilated.  Impressions:  - Low normal LV systolic function; grade 2 diastolic dysfunction;  mild LAE; mild TR; severely dilated ascending aorta (51 mm);  suggest CTA or MRA to further assess.   CTA CHEST IMPRESSION 11/2079: Ascending thoracic aortic aneurysm measuring 4.9 cm diameter, unchanged since previous study. No evidence of rupture or dissection. No abdominal aortic aneurysm. Aortic atherosclerosis. No evidence of active pulmonary disease. No acute process demonstrated in the abdomen or pelvis.   Electronically Signed   By: Lucienne Capers M.D.   On: 11/20/2016 05:09    MRI IMPRESSION from 04/2016: 1. 4.8 cm ascending aortic aneurysm without complicating features. Recommend semi-annual imaging followup by CTA or MRA and referral to cardiothoracic surgery if not already obtained. This recommendation follows 2010 ACCF/AHA/AATS/ACR/ASA/SCA/SCAI/SIR/STS/SVM Guidelines for the Diagnosis and Management of Patients With Thoracic Aortic Disease. Circulation. 2010;  121: G017-C944  Assessment/Plan:  1.  History of HTN - BP was running low - just fine here today on no therapy.   2. Dilated ascending aortic aneurysm with bicuspid aortic valve - seeing EBG - we have reviewed her restrictions - she understands to avoid strenuous lifting over 25 pounds, avoid pregnancy, &avoid HTN. She was to have a repeat scan in 6 months. July ER visit for chest pain - with negative CT for dissection. Pregnancy is not advised in this situation. She has told me in the past that she has had prior tubal ligation. She is to have follow up MRA in August/September per EBG. She does need to get her echo updated.   3. PAF - lone episode - Dr. Rayann Heman wishes to avoid anticoagulation given onset of seizures - to use prn Cardizem - she has not had to use. No recurrence. EKG shows NSR today.   4. History of NSVT - No evidence of structural heart disease. No FH of arrhythmias. No ischemic symptoms - recommended previously - tomaintain electrolytes within therapeutic range and continue to follow conservatively  4. Obesity - prior gastric sleeve with previous successful weight loss  5. Seizure disorder - followed closely by neurology.      6. Diastolic dysfunction - appears compensated  7. Possible infected tooth - will give her a week of Amoxicillin - she is going to try and call her dentist - this is cost prohibitive but maybe they will work with her. Not sure if the hospital dentist will work with her.    Current medicines are reviewed with the patient today.  The patient does not have concerns regarding medicines other than what has been noted above.  The following changes have been made:  See above.  Labs/ tests ordered today include:    Orders Placed This Encounter  Procedures  . EKG 12-Lead  . ECHOCARDIOGRAM COMPLETE     Disposition:   FU with me in 6 months.   Patient is agreeable to this plan and will call if any problems develop in the interim.    SignedTruitt Merle, NP  03/01/2017 2:20 PM  [place] [address] [place], [place]  [address] Phone: [phone] Fax: [phone]

## 2017-03-08 ENCOUNTER — Ambulatory Visit (INDEPENDENT_AMBULATORY_CARE_PROVIDER_SITE_OTHER): Payer: BLUE CROSS/BLUE SHIELD | Admitting: Obstetrics & Gynecology

## 2017-03-08 ENCOUNTER — Encounter: Payer: Self-pay | Admitting: Obstetrics & Gynecology

## 2017-03-08 ENCOUNTER — Ambulatory Visit (INDEPENDENT_AMBULATORY_CARE_PROVIDER_SITE_OTHER): Payer: BLUE CROSS/BLUE SHIELD | Admitting: Clinical

## 2017-03-08 VITALS — BP 135/78 | HR 84 | Ht 70.0 in | Wt 270.1 lb

## 2017-03-08 DIAGNOSIS — Z1151 Encounter for screening for human papillomavirus (HPV): Secondary | ICD-10-CM

## 2017-03-08 DIAGNOSIS — F4322 Adjustment disorder with anxiety: Secondary | ICD-10-CM | POA: Diagnosis not present

## 2017-03-08 DIAGNOSIS — Z01419 Encounter for gynecological examination (general) (routine) without abnormal findings: Secondary | ICD-10-CM | POA: Diagnosis not present

## 2017-03-08 DIAGNOSIS — N83201 Unspecified ovarian cyst, right side: Secondary | ICD-10-CM

## 2017-03-08 DIAGNOSIS — Z124 Encounter for screening for malignant neoplasm of cervix: Secondary | ICD-10-CM

## 2017-03-08 NOTE — BH Specialist Note (Signed)
Integrated Behavioral Health Initial Visit  MRN: [id number] Name: Tammie [name]   Session Start time: 1:40 Session End time: 2:10 Total time: 30 minutes  Type of Service: [place] Interpretor:No. Interpretor Name and Language: n/a   Warm Hand Off Completed.       SUBJECTIVE: Tammie [name] is a 41 y.o. female accompanied by patient. Patient was referred by Dr Hulan Fray for anxiety. Patient reports the following symptoms/concerns: Pt states that she feels overwhelmed with life stress; using deep breathing helps to cope. 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[employer], Psychology major) Self-Care: Deep breathing, when she remembers; forgets to eat, difficulty falling asleep Life Changes: Several family losses in past two years, going back to school, losing job because of health issues, in disability process, adopting niece, having seizures  GOALS ADDRESSED: Patient will reduce symptoms of: anxiety and stress and increase knowledge and/or ability of: self-management skills and also: Increase healthy adjustment to current life circumstances   INTERVENTIONS: Mindfulness or Psychologist, educational, Psychoeducation and/or Health Education and Link to [employer]  Standardized Assessments completed: GAD-7 and PHQ 9  ASSESSMENT: Patient currently experiencing Adjustment disorder with anxious mood. Patient may benefit from psychoeducation and brief therapeutic intervention regarding coping with symptoms of anxiety.  PLAN: 1. Follow up with behavioral health clinician on : As needed 2. Behavioral recommendations:  -Continue using deep  breathing exercises when stressed; consider using daily for prevention -Consider apps for additional self-care -Read educational materials regarding coping with symptoms of anxiety -Consider [employer] as additional community resource 3. Referral(s): Integrated [employer] (In [place]) and [employer] Resources:  [place], [place]  Depression screen [facility] 2/9 03/08/2017  Decreased Interest 0  Down, Depressed, Hopeless 2  PHQ - 2 Score 2  Altered sleeping 2  Tired, decreased energy 2  Change in appetite 1  Feeling bad or failure about yourself  1  Trouble concentrating 0  Moving slowly or fidgety/restless 0  Suicidal thoughts 0  PHQ-9 Score 8   GAD 7 : Generalized Anxiety Score 03/08/2017  Nervous, Anxious, on Edge 2  Control/stop worrying 2  Worry too much - different things 2  Trouble relaxing 2  Restless 1  Easily annoyed or irritable 1  Afraid - awful might happen 2  Total GAD 7 Score 12

## 2017-03-08 NOTE — Progress Notes (Signed)
Subjective:    Tammie [name] is a 41 y.o. S AA P2 (57 and 34 yo kids) female who presents for an annual exam. She has pelvic pain for a couple of months. She was at the [facility] recently and an u/s showed a 3.2 cm cyst on her ovary. The patient is sexually active. GYN screening history: last pap: was normal. The patient wears seatbelts: yes. The patient participates in regular exercise: yes. Has the patient ever been transfused or tattooed?: yes. The patient reports that there is not domestic violence in her life.   Menstrual History: OB History    Gravida Para Term Preterm AB Living   2 2 2     2    SAB TAB Ectopic Multiple Live Births                  Menarche age: 69 Patient's last menstrual period was 01/27/2017 (exact date).    The following portions of the patient's history were reviewed and updated as appropriate: allergies, current medications, past family history, past medical history, past social history, past surgical history and problem list.  Review of Systems Pertinent items are noted in HPI.   Had a gastric sleeve a couple of years ago. She is down 76 pounds In college at [employer] for 6 years, lives together, denies dyspareunia Uses condoms FH-+ breast cancer, cousin, no gyn or colon cancer She has a seizure disorder.  She has an aortic aneurysm Objective:    BP 135/78   Pulse 84   Ht 5\' 10"  (1.778 m)   Wt 270 lb 1 oz (122.5 kg)   LMP 01/27/2017 (Exact Date)   BMI 38.75 kg/m   General Appearance:    Alert, cooperative, no distress, appears stated age  Head:    Normocephalic, without obvious abnormality, atraumatic  Eyes:    PERRL, conjunctiva/corneas clear, EOM's intact, fundi    benign, both eyes  Ears:    Normal TM's and external ear canals, both ears  Nose:   Nares normal, septum midline, mucosa normal, no drainage    or sinus tenderness  Throat:   Lips, mucosa, and tongue normal; teeth and gums normal  Neck:   Supple, symmetrical, trachea midline,  no adenopathy;    thyroid:  no enlargement/tenderness/nodules; no carotid   bruit or JVD  Back:     Symmetric, no curvature, ROM normal, no CVA tenderness  Lungs:     Clear to auscultation bilaterally, respirations unlabored  Chest Wall:    No tenderness or deformity   Heart:    Regular rate and rhythm, S1 and S2 normal, no murmur, rub   or gallop  Breast Exam:    No tenderness, masses, or nipple abnormality  Abdomen:     Soft, non-tender, bowel sounds active all four quadrants,    no masses, no organomegaly  Genitalia:    Normal female without lesion, discharge or tenderness, no palpable mass, starting her period with my exam/pap     Extremities:   Extremities normal, atraumatic, no cyanosis or edema  Pulses:   2+ and symmetric all extremities  Skin:   Skin color, texture, turgor normal, no rashes or lesions  Lymph nodes:   Cervical, supraclavicular, and axillary nodes normal  Neurologic:   CNII-XII intact, normal strength, sensation and reflexes    throughout  .    Assessment:    Healthy female exam.    Plan:     Thin prep Pap smear. with cotesting  Mammograms  start next year Right Ovarian cyst- repeat u/s RTC 2 weeks

## 2017-03-08 NOTE — Progress Notes (Signed)
Pt referred for ovarian cyst. States that she is in a lot of pain.

## 2017-03-09 ENCOUNTER — Inpatient Hospital Stay (HOSPITAL_COMMUNITY): Payer: BLUE CROSS/BLUE SHIELD

## 2017-03-09 ENCOUNTER — Encounter (HOSPITAL_COMMUNITY): Payer: Self-pay

## 2017-03-09 ENCOUNTER — Inpatient Hospital Stay (HOSPITAL_COMMUNITY)
Admission: AD | Admit: 2017-03-09 | Discharge: 2017-03-09 | Disposition: A | Discharge status: Home / Self Care | Payer: BLUE CROSS/BLUE SHIELD | Source: Ambulatory Visit | Attending: Obstetrics and Gynecology | Admitting: Obstetrics and Gynecology

## 2017-03-09 ENCOUNTER — Telehealth: Payer: Self-pay | Admitting: Nurse Practitioner

## 2017-03-09 ENCOUNTER — Other Ambulatory Visit: Payer: Self-pay | Admitting: *Deleted

## 2017-03-09 DIAGNOSIS — J45909 Unspecified asthma, uncomplicated: Secondary | ICD-10-CM | POA: Insufficient documentation

## 2017-03-09 DIAGNOSIS — Z79899 Other long term (current) drug therapy: Secondary | ICD-10-CM | POA: Insufficient documentation

## 2017-03-09 DIAGNOSIS — R109 Unspecified abdominal pain: Secondary | ICD-10-CM

## 2017-03-09 DIAGNOSIS — R102 Pelvic and perineal pain: Secondary | ICD-10-CM | POA: Insufficient documentation

## 2017-03-09 DIAGNOSIS — Z9889 Other specified postprocedural states: Secondary | ICD-10-CM | POA: Diagnosis not present

## 2017-03-09 DIAGNOSIS — Z9851 Tubal ligation status: Secondary | ICD-10-CM | POA: Diagnosis not present

## 2017-03-09 DIAGNOSIS — G40909 Epilepsy, unspecified, not intractable, without status epilepticus: Secondary | ICD-10-CM | POA: Diagnosis not present

## 2017-03-09 DIAGNOSIS — N946 Dysmenorrhea, unspecified: Secondary | ICD-10-CM

## 2017-03-09 DIAGNOSIS — I10 Essential (primary) hypertension: Secondary | ICD-10-CM | POA: Diagnosis not present

## 2017-03-09 DIAGNOSIS — N83201 Unspecified ovarian cyst, right side: Secondary | ICD-10-CM | POA: Diagnosis not present

## 2017-03-09 DIAGNOSIS — N83209 Unspecified ovarian cyst, unspecified side: Secondary | ICD-10-CM

## 2017-03-09 LAB — CBC WITH DIFFERENTIAL/PLATELET
Basophils Absolute: 0 10*3/uL (ref 0.0–0.1)
Basophils Relative: 0 %
EOS PCT: 0 %
Eosinophils Absolute: 0 10*3/uL (ref 0.0–0.7)
HCT: 33.8 % — ABNORMAL LOW (ref 36.0–46.0)
HEMOGLOBIN: 10.7 g/dL — AB (ref 12.0–15.0)
LYMPHS ABS: 1.7 10*3/uL (ref 0.7–4.0)
LYMPHS PCT: 16 %
MCH: 27.1 pg (ref 26.0–34.0)
MCHC: 31.7 g/dL (ref 30.0–36.0)
MCV: 85.6 fL (ref 78.0–100.0)
Monocytes Absolute: 0.2 10*3/uL (ref 0.1–1.0)
Monocytes Relative: 2 %
Neutro Abs: 8.6 10*3/uL — ABNORMAL HIGH (ref 1.7–7.7)
Neutrophils Relative %: 82 %
PLATELETS: 281 10*3/uL (ref 150–400)
RBC: 3.95 MIL/uL (ref 3.87–5.11)
RDW: 14.2 % (ref 11.5–15.5)
WBC: 10.6 10*3/uL — AB (ref 4.0–10.5)

## 2017-03-09 LAB — URINALYSIS, ROUTINE W REFLEX MICROSCOPIC

## 2017-03-09 LAB — CYTOLOGY - PAP
Adequacy: ABSENT
DIAGNOSIS: NEGATIVE
HPV: NOT DETECTED

## 2017-03-09 LAB — URINALYSIS, MICROSCOPIC (REFLEX)

## 2017-03-09 LAB — POCT PREGNANCY, URINE: PREG TEST UR: NEGATIVE

## 2017-03-09 MED ORDER — OXYCODONE-ACETAMINOPHEN 5-325 MG PO TABS
2.0000 | ORAL_TABLET | Freq: Once | ORAL | Status: AC
  Administered 2017-03-09: 2 via ORAL
  Filled 2017-03-09: qty 2

## 2017-03-09 MED ORDER — OXYCODONE-ACETAMINOPHEN 5-325 MG PO TABS: 1.0000 | ORAL_TABLET | Freq: Four times a day (QID) | ORAL | 0 refills | Status: AC | PRN

## 2017-03-09 NOTE — Telephone Encounter (Signed)
Per chart review patient has presented to [facility] for c/o pain from ovarian cyst - is currently being seen in [facility]. Will defer treatment to [facility].

## 2017-03-09 NOTE — Telephone Encounter (Signed)
New Message  Pt voiced she is in pain and other MD/PCP voiced she cannot give anything due to her heart condition and for her to contact us in regards to what she can take.  Please f/u

## 2017-03-09 NOTE — Telephone Encounter (Signed)
Tammie [name] called this am and left a voicemail that she was seen yesteday for ovarian cyst. Wants to know if her doctor can give her something for pain.

## 2017-03-09 NOTE — Progress Notes (Addendum)
Went to clinic yesterday for lower abdominal pain. Wasn't able to sleep all night and taking tylenol 2-3 hrs. Instructed pt should not be taking more than 4g in a 24 hr day. Provider instructed she could not take motrin dt aortic aneurysm. Ovarian Cysts dx 2 months ago via U/S on the right side.    Hx pt states had ovarian cyst on left that was removed   Hx of seizures dx April 2017 - states son found her. Afib and taken to [facility] and it was there they found she has arotic aneurysm.   Pt states had seizure last week but no witness. States had called boyfriend during the episode when ready to cook. States boyfriend found her in her room. On medication called Keppro 3000mg .   Currently pt on monthly period that started yesterday but stating having "alot" of unusual clots.   1238: Medicated for 10/10 pain  1257: to U/S via wheelchair taken by tech  1327: back from U/S  1400: Discharge instructions given with pt understanding. Pt left unit via ambulatory with SO and family members.

## 2017-03-09 NOTE — Telephone Encounter (Signed)
Tramadol will not interact with any of her cardiac medications. Opioids in general should not interact with cardiac medications. Would avoid NSAIDs due to cardiovascular risk.

## 2017-03-09 NOTE — [facility] Note (Signed)
Pt was seen yesterday in clinic told she had an ovarian cyst. Period started 2 days ago. Told to take tylenol (can't take ibuprofen). Pain is not being relieved by tylenol. Pt has history of an Aortic aneurism .

## 2017-03-09 NOTE — Discharge Instructions (Signed)
Dysmenorrhea Dysmenorrhea means painful cramps during your period (menstrual period). You will have pain in your lower belly (abdomen). The pain is caused by the tightening (contracting) of the muscles of the womb (uterus). The pain may be mild or very bad. With this condition, you may:  Have a headache.  Feel sick to your stomach (nauseous).  Throw up (vomit).  Have lower back pain.  Follow these instructions at home: Helping pain and cramping  Put heat on your lower back or belly when you have pain or cramps. Use the heat source that your doctor tells you to use. ? Place a towel between your skin and the heat. ? Leave the heat on for 20-30 minutes. ? Remove the heat if your skin turns bright red. This is especially important if you cannot feel pain, heat, or cold. ? Do not have a heating pad on during sleep.  Do aerobic exercises. These include walking, swimming, or biking. These may help with cramps.  Massage your lower back or belly. This may help lessen pain. General instructions  Take over-the-counter and prescription medicines only as told by your doctor.  Do not drive or use heavy machinery while taking prescription pain medicine.  Avoid alcohol and caffeine during and right before your period. These can make cramps worse.  Do not use any products that have nicotine or tobacco. These include cigarettes and e-cigarettes. If you need help quitting, ask your doctor.  Keep all follow-up visits as told by your doctor. This is important. Contact a doctor if:  You have pain that gets worse.  You have pain that does not get better with medicine.  You have pain during sex.  You feel sick to your stomach or you throw up during your period, and medicine does not help. Get help right away if:  You pass out (faint). Summary  Dysmenorrhea means painful cramps during your period (menstrual period).  Put heat on your lower back or belly when you have pain or cramps.  Do  exercises like walking, swimming, or biking to help with cramps.  Contact a doctor if you have pain during sex. This information is not intended to replace advice given to you by your health care provider. Make sure you discuss any questions you have with your health care provider. Document Released: 12/23/2008 Document Revised: 10/13/2016 Document Reviewed: 10/13/2016 Elsevier Interactive Patient Education  2017 [employer]. Ovarian Cyst An ovarian cyst is a fluid-filled sac on an ovary. The ovaries are organs that make eggs in women. Most ovarian cysts go away on their own and are not cancerous (are benign). Some cysts need treatment. Follow these instructions at home:  Take over-the-counter and prescription medicines only as told by your doctor.  Do not drive or use heavy machinery while taking prescription pain medicine.  Get pelvic exams and Pap tests as often as told by your doctor.  Return to your normal activities as told by your doctor. Ask your doctor what activities are safe for you.  Do not use any products that contain nicotine or tobacco, such as cigarettes and e-cigarettes. If you need help quitting, ask your doctor.  Keep all follow-up visits as told by your doctor. This is important. Contact a doctor if:  Your periods are: ? Late. ? Irregular. ? Painful.  Your periods stop.  You have pelvic pain that does not go away.  You have pressure on your bladder.  You have trouble making your bladder empty when you pee (urinate).  You have pain during sex.  You have any of the following in your belly (abdomen): ? A feeling of fullness. ? Pressure. ? Discomfort. ? Pain that does not go away. ? Swelling.  You feel sick most of the time.  You have trouble pooping (have constipation).  You are not as hungry as usual (you lose your appetite).  You get very bad acne.  You start to have more hair on your body and face.  You are gaining weight or losing weight  without changing your exercise and eating habits.  You think you may be pregnant. Get help right away if:  You have belly pain that is very bad or gets worse.  You cannot eat or drink without throwing up (vomiting).  You suddenly get a fever.  Your period is a lot heavier than usual. This information is not intended to replace advice given to you by your health care provider. Make sure you discuss any questions you have with your health care provider. Document Released: 03/14/2008 Document Revised: 04/15/2016 Document Reviewed: 02/28/2016 Elsevier Interactive Patient Education  2017 [employer].

## 2017-03-09 NOTE — Telephone Encounter (Signed)
Patient went to the ER after calling the office. Patient was prescribed Percocet at her visit.

## 2017-03-09 NOTE — [facility] Provider Note (Signed)
Subjective:  Patient ID: Tammie [name], female    DOB: 12/24/1975  Age: 41 y.o. MRN: [id number]  CC: Pelvic Pain   HPI Tammie [name] is a 41 y.o. female with a history of epilepsy, TAA, morbid obesity, and asthma  who presents for pelvic pain. Patient met with Dr. Marice Potter yesterday for annual exam. Patient discussed her pelvic pain with Dr. Marice Potter, which she reported was manageable at the time. Dr. Marice Potter suggested she use Tylenol, pain patch, herbal tea, and heating pad as modalities to treat her pain. Sometime after her appointment her pain significantly worsened. She describes the pain as a right lower stabbing, dull,  10/10 abdominal pain that radiates across her lower abdomen and into her back. The pain is constant and has prevented her from both eating and sleeping. She has tried all the modalities mentioned above and has not been able to relieve pain at all. She has nausea with the pain but denies any vomiting, fever, chills, chest pain, or shortness of breath.    Patient is currently on day 2 of her period. Patient describes that her normal periods tend to last 3 days and that this period is significantly more painful than normal. She reports that she is experiencing more blood clots than usual, but the flow is about the same as her normal period, which as mentioned above is three days of heavy bleeding.   Past Medical History:  Diagnosis Date  . Abnormal Pap smear   . Anemia   . Aortic root enlargement (HCC) 5/17   5.1cm thoracic aorta by echo  . Ascending aortic aneurysm (HCC)   . Asthma    as child  . Esophageal reflux   . Fibroid   . Hypertension   . Hypopotassemia   . Obesity    s/p gastric sleeve  . Palpitations   . Paroxysmal atrial fibrillation (HCC)   . Pregnancy induced hypertension   . Premature ventricular contraction   . Seizure (HCC)     Allergies  Allergen Reactions  . Sulfa Antibiotics Anaphylaxis, Rash and Other (See Comments)    "Burning"  .  Sulfamethoxazole Anaphylaxis, Rash and Other (See Comments)    burning  . Tape Rash    Leads on heart monitor have caused a rash    Prescriptions Prior to Admission  Medication Sig Dispense Refill Last Dose  . acetaminophen (TYLENOL) 500 MG tablet Take 1,000 mg by mouth every 6 (six) hours as needed for mild pain, moderate pain, fever or headache.   Taking  . amoxicillin (AMOXIL) 500 MG tablet Take 1 tablet (500 mg total) by mouth 2 (two) times daily. 14 tablet 0 Taking  . calcium-vitamin D (OSCAL WITH D) 500-200 MG-UNIT tablet Take 3 tablets by mouth daily.   Taking  . dicyclomine (BENTYL) 20 MG tablet Take 1 tablet (20 mg total) by mouth 2 (two) times daily. (Patient not taking: Reported on 03/08/2017) 20 tablet 0 Not Taking  . diltiazem (CARDIZEM) 30 MG tablet Take 30 mg by mouth 2 (two) times daily.    Taking  . IRON PO Take 2 tablets by mouth daily.   Taking  . Levetiracetam 750 MG TB24 Take 4 tablets (3,000 mg total) by mouth daily. 120 tablet 5 Taking  . magic mouthwash SOLN Take 30 mLs by mouth 3 (three) times daily. 630 mL 2 Taking  . metroNIDAZOLE (FLAGYL) 500 MG tablet Take 1 tablet (500 mg total) by mouth 2 (two) times daily. One po bid x 7  days (Patient not taking: Reported on 03/08/2017) 14 tablet 0 Not Taking  . Multiple Vitamin (MULTIVITAMIN WITH MINERALS) TABS tablet Take 2 tablets by mouth daily.   Taking  . omeprazole (PRILOSEC) 20 MG capsule Take 20 mg by mouth.   Taking  . ondansetron (ZOFRAN ODT) 8 MG disintegrating tablet '8mg'$  ODT q8 hours prn nausea (Patient taking differently: Take 8 mg by mouth every 8 (eight) hours as needed for nausea. ) 4 tablet 0 Taking  . promethazine (PHENERGAN) 25 MG tablet Take 0.5-1 tablets (12.5-25 mg total) by mouth every 6 (six) hours as needed. 30 tablet 0 Taking    Past Surgical History:  Procedure Laterality Date  . COLPOSCOPY    . [place] RESECTION  9/16   [facility]  . OOPHORECTOMY     Left  . TUBAL  LIGATION      OB History    Gravida Para Term Preterm AB Living   '2 2 2     2   '$ SAB TAB Ectopic Multiple Live Births                  Family History  Problem Relation Age of Onset  . Hypertension Father   . Kidney disease Father   . Cancer Father   . Diabetes Father   . Diabetes Maternal Grandmother   . Hypertension Paternal Grandmother   . Epilepsy Cousin   . Epilepsy Maternal Aunt   . Epilepsy Maternal Uncle   . Febrile seizures Son   . Asthma Son     Social History  Substance Use Topics  . Smoking status: Never Smoker  . Smokeless tobacco: Never Used  . Alcohol use No    ROS Review of Systems  Constitutional: Negative for chills, fatigue and fever.  Respiratory: Negative for shortness of breath.   Cardiovascular: Positive for leg swelling. Negative for chest pain.  Gastrointestinal: Positive for nausea. Negative for constipation, diarrhea and vomiting.  Genitourinary: Positive for pelvic pain. Negative for difficulty urinating, dysuria, frequency and urgency.  Musculoskeletal: Negative for arthralgias.  Neurological: Negative for dizziness and numbness.    Objective:   Today's Vitals: BP (!) 151/87   Pulse 79   Temp 98.2 F (36.8 C) (Oral)   Resp 18   Ht '5\' 10"'$  (1.778 m)   Wt 121.6 kg (268 lb)   LMP 03/07/2017   BMI 38.45 kg/m   Physical Exam  Constitutional: She is oriented to person, place, and time. She appears well-developed and well-nourished.  HENT:  Head: Normocephalic and atraumatic.  Cardiovascular: Normal rate and regular rhythm.  Exam reveals no gallop and no friction rub.   No murmur heard. Pulmonary/Chest: Effort normal and breath sounds normal. She has no wheezes. She has no rales.  Abdominal: Soft. Bowel sounds are normal. She exhibits no distension. There is tenderness.  Musculoskeletal: Normal range of motion. She exhibits edema.  Neurological: She is alert and oriented to person, place, and time.  Skin: Skin is warm and dry.     Results for orders placed or performed during the hospital encounter of 03/09/17 (from the past 24 hour(s))  Urinalysis, Routine w reflex microscopic   Collection Time: 03/09/17 11:10 AM  Result Value Ref Range   Color, Urine RED (A) YELLOW   APPearance TURBID (A) CLEAR   Specific Gravity, Urine  1.005 - 1.030    TEST NOT REPORTED DUE TO COLOR INTERFERENCE OF URINE PIGMENT   pH  5.0 - 8.0  TEST NOT REPORTED DUE TO COLOR INTERFERENCE OF URINE PIGMENT   Glucose, UA (A) NEGATIVE mg/dL    TEST NOT REPORTED DUE TO COLOR INTERFERENCE OF URINE PIGMENT   Hgb urine dipstick (A) NEGATIVE    TEST NOT REPORTED DUE TO COLOR INTERFERENCE OF URINE PIGMENT   Bilirubin Urine (A) NEGATIVE    TEST NOT REPORTED DUE TO COLOR INTERFERENCE OF URINE PIGMENT   Ketones, ur (A) NEGATIVE mg/dL    TEST NOT REPORTED DUE TO COLOR INTERFERENCE OF URINE PIGMENT   Protein, ur (A) NEGATIVE mg/dL    TEST NOT REPORTED DUE TO COLOR INTERFERENCE OF URINE PIGMENT   Nitrite (A) NEGATIVE    TEST NOT REPORTED DUE TO COLOR INTERFERENCE OF URINE PIGMENT   Leukocytes, UA (A) NEGATIVE    TEST NOT REPORTED DUE TO COLOR INTERFERENCE OF URINE PIGMENT  Urinalysis, Microscopic (reflex)   Collection Time: 03/09/17 11:10 AM  Result Value Ref Range   RBC / HPF TOO NUMEROUS TO COUNT 0 - 5 RBC/hpf   WBC, UA 0-5 0 - 5 WBC/hpf   Bacteria, UA RARE (A) NONE SEEN   Squamous Epithelial / LPF 0-5 (A) NONE SEEN  Pregnancy, urine POC   Collection Time: 03/09/17 11:39 AM  Result Value Ref Range   Preg Test, Ur NEGATIVE NEGATIVE  CBC with Differential/Platelet   Collection Time: 03/09/17 12:53 PM  Result Value Ref Range   WBC 10.6 (H) 4.0 - 10.5 K/uL   RBC 3.95 3.87 - 5.11 MIL/uL   Hemoglobin 10.7 (L) 12.0 - 15.0 g/dL   HCT 33.8 (L) 36.0 - 46.0 %   MCV 85.6 78.0 - 100.0 fL   MCH 27.1 26.0 - 34.0 pg   MCHC 31.7 30.0 - 36.0 g/dL   RDW 14.2 11.5 - 15.5 %   Platelets 281 150 - 400 K/uL   Neutrophils Relative % 82 %   Neutro Abs 8.6  (H) 1.7 - 7.7 K/uL   Lymphocytes Relative 16 %   Lymphs Abs 1.7 0.7 - 4.0 K/uL   Monocytes Relative 2 %   Monocytes Absolute 0.2 0.1 - 1.0 K/uL   Eosinophils Relative 0 %   Eosinophils Absolute 0.0 0.0 - 0.7 K/uL   Basophils Relative 0 %   Basophils Absolute 0.0 0.0 - 0.1 K/uL   US Transvaginal Non-ob  Result Date: 03/09/2017 CLINICAL DATA:  Right lower quadrant abdominal pain. Previously demonstrated 3.2 cm right ovarian probable hemorrhagic cyst or endometrioma. Previous left oophorectomy. EXAM: TRANSVAGINAL ULTRASOUND OF PELVIS DOPPLER ULTRASOUND OF OVARIES TECHNIQUE: Transvaginal ultrasound examination of the pelvis was performed including evaluation of the uterus, ovaries, adnexal regions, and pelvic cul-de-sac. Color and duplex Doppler ultrasound was utilized to evaluate blood flow to the ovaries. COMPARISON:  12/16/2016. FINDINGS: Uterus Measurements: 10.6 x 6.1 x 5.9 cm. 2.6 x 2.1 x 1.9 cm oval mass centrally. This with not previously seen and appears to be within the fundal endometrium. Endometrium Thickness: 2.6 cm in the fundus, including the fundal mass. 2.6 x 2.1 x 1.9 cm oval, mildly lobulated, mildly heterogeneous, predominantly hypoechoic mass in the fundal portion of the endometrium. Right ovary Measurements: 4.7 x 3.7 x 3.2 cm. 3.0 x 2.8 x 2.8 cm oval cyst containing diffuse internal echoes, previously 3.2 x 3.0 x 2.5 cm. No internal blood flow with color Doppler. Left ovary Surgically absent. Pulsed Doppler evaluation demonstrates normal low-resistance arterial and venous waveforms in the right ovary. IMPRESSION: 1. Interval visualization of a 2.6 x 2.1 x 1.9 cm oval  mass in the fundal portion of the endometrium. This could represent an endometrial polyp or a submucosal fibroid. 2. Little change in a 3.0 cm right ovarian hemorrhagic cyst or endometrioma. 3. No evidence of ovarian torsion. 4. Surgically absent left ovary. Electronically Signed   By: Claudie Revering M.D.   On: 03/09/2017  13:25   [place] Art/ven Flow Abd Pelv Doppler  Result Date: 03/09/2017 CLINICAL DATA:  Right lower quadrant abdominal pain. Previously demonstrated 3.2 cm right ovarian probable hemorrhagic cyst or endometrioma. Previous left oophorectomy. EXAM: TRANSVAGINAL ULTRASOUND OF PELVIS DOPPLER ULTRASOUND OF OVARIES TECHNIQUE: Transvaginal ultrasound examination of the pelvis was performed including evaluation of the uterus, ovaries, adnexal regions, and pelvic cul-de-sac. Color and duplex Doppler ultrasound was utilized to evaluate blood flow to the ovaries. COMPARISON:  12/16/2016. FINDINGS: Uterus Measurements: 10.6 x 6.1 x 5.9 cm. 2.6 x 2.1 x 1.9 cm oval mass centrally. This with not previously seen and appears to be within the fundal endometrium. Endometrium Thickness: 2.6 cm in the fundus, including the fundal mass. 2.6 x 2.1 x 1.9 cm oval, mildly lobulated, mildly heterogeneous, predominantly hypoechoic mass in the fundal portion of the endometrium. Right ovary Measurements: 4.7 x 3.7 x 3.2 cm. 3.0 x 2.8 x 2.8 cm oval cyst containing diffuse internal echoes, previously 3.2 x 3.0 x 2.5 cm. No internal blood flow with color Doppler. Left ovary Surgically absent. Pulsed Doppler evaluation demonstrates normal low-resistance arterial and venous waveforms in the right ovary. IMPRESSION: 1. Interval visualization of a 2.6 x 2.1 x 1.9 cm oval mass in the fundal portion of the endometrium. This could represent an endometrial polyp or a submucosal fibroid. 2. Little change in a 3.0 cm right ovarian hemorrhagic cyst or endometrioma. 3. No evidence of ovarian torsion. 4. Surgically absent left ovary. Electronically Signed   By: Claudie Revering M.D.   On: 03/09/2017 13:25    Assessment & Plan:   Assessment: CHAKITA MCGRAW is a 40 y.o. [id number] female with a history of right 3.2 cm hemorrhagic ovarian cyst, history of prior oophorectomy for massive cyst, and TAA who presents to today with significant right lower quadrant  abdominal pain in the setting of her period. Most significant concern is ovarian torsion, however U/S and doppler studies have shown that ovaries have good vascular supply. This makes ovarian torsion unlikely. Patient is likely suffering from dysmenorrhea.   Plan:  #Dysmenorrhea Patient is most likely experiencing an extremely painful period. We have given her Percocet here and patient was able to obtain some relief, reporting 5/10 pain. Patient was extremely thankful for some relief. We will prescribe her  Percocet to take for her dysmenorrhea and discharge patient. We will have patient follow up with outpatient provider. - Percocet 5-325 mg  q6H PRN - Patient is stable and ready for discharge home - Will have patient follow-up with outpatient provider  Follow-up: No Follow-up on file.   Graciela Husbands, Medical Student 03/09/2017 12:28 PM  I confirm that I have verified the information documented in the medical student's note and that I have also personally reperformed the physical exam and all medical decision making activities.  Luvenia Redden, PA-C 03/09/2017 2:14 PM

## 2017-03-10 ENCOUNTER — Telehealth: Payer: Self-pay | Admitting: *Deleted

## 2017-03-10 NOTE — Telephone Encounter (Signed)
S/w pt about gynecological issues. Was in excruiting pain and doctor's office would not prescribe anything due to cardiac issues.  After laying in a tub of hot water still had lots of pain and needed medication, since gyno would not give pt pain medication pt had to go to ER for relief. Pt is feeling better today.

## 2017-03-13 ENCOUNTER — Ambulatory Visit (INDEPENDENT_AMBULATORY_CARE_PROVIDER_SITE_OTHER): Payer: BLUE CROSS/BLUE SHIELD | Admitting: Neurology

## 2017-03-13 DIAGNOSIS — G40909 Epilepsy, unspecified, not intractable, without status epilepticus: Secondary | ICD-10-CM | POA: Diagnosis not present

## 2017-03-17 ENCOUNTER — Ambulatory Visit (HOSPITAL_COMMUNITY): Payer: BLUE CROSS/BLUE SHIELD

## 2017-03-20 ENCOUNTER — Other Ambulatory Visit: Payer: Self-pay

## 2017-03-20 ENCOUNTER — Ambulatory Visit (HOSPITAL_COMMUNITY): Payer: BLUE CROSS/BLUE SHIELD | Attending: Cardiology

## 2017-03-20 DIAGNOSIS — I361 Nonrheumatic tricuspid (valve) insufficiency: Secondary | ICD-10-CM | POA: Diagnosis not present

## 2017-03-20 DIAGNOSIS — I712 Thoracic aortic aneurysm, without rupture, unspecified: Secondary | ICD-10-CM

## 2017-03-20 DIAGNOSIS — I517 Cardiomegaly: Secondary | ICD-10-CM | POA: Diagnosis not present

## 2017-03-20 DIAGNOSIS — Q231 Congenital insufficiency of aortic valve: Secondary | ICD-10-CM | POA: Insufficient documentation

## 2017-03-20 DIAGNOSIS — I359 Nonrheumatic aortic valve disorder, unspecified: Secondary | ICD-10-CM | POA: Diagnosis present

## 2017-03-20 LAB — ECHOCARDIOGRAM COMPLETE
Area-P 1/2: 3.1 cm2
E decel time: 243 msec
E/e' ratio: 9.75
FS: 30 % (ref 28–44)
IVS/LV PW RATIO, ED: 0.93
LA ID, A-P, ES: 27 mm
LA diam end sys: 27 mm
LA diam index: 1.14 cm/m2
LA vol A4C: 30.2 ml
LA vol index: 17.3 mL/m2
LA vol: 40.9 mL
LV E/e' medial: 9.75
LV E/e'average: 9.75
LV PW d: 15 mm — AB (ref 0.6–1.1)
LV e' LATERAL: 8.81 cm/s
LVOT area: 3.8 cm2
LVOT diameter: 22 mm
Lateral S' vel: 13.9 cm/s
MV Dec: 243
MV Peak grad: 3 mmHg
MV pk A vel: 73.7 m/s
MV pk E vel: 85.9 m/s
P 1/2 time: 71 ms
Reg peak vel: 243 cm/s
TDI e' lateral: 8.81
TDI e' medial: 7.94
TR max vel: 243 cm/s

## 2017-03-24 ENCOUNTER — Ambulatory Visit: Payer: Self-pay | Admitting: Obstetrics & Gynecology

## 2017-03-26 NOTE — Procedures (Signed)
ELECTROENCEPHALOGRAM REPORT  Dates of Recording: 03/13/2017 12:31PM to 03/16/2017 09:49AM  Patient's Name: Tammie [name] MRN: [id number] Date of Birth: June 06, 1976  Referring Provider: Dr. Narda Amber  Procedure: 72-hour ambulatory EEG  History: This is a 41 year old woman with recurrent seizures. EEG for classification.  Medications: Keppra, Cardizem  Technical Summary: This is a 72-hour multichannel digital EEG recording measured by the international 10-20 system with electrodes applied with paste and impedances below 5000 ohms performed as portable with EKG monitoring.  The digital EEG was referentially recorded, reformatted, and digitally filtered in a variety of bipolar and referential montages for optimal display.    DESCRIPTION OF RECORDING: During maximal wakefulness, the background activity consisted of a symmetric 9 Hz posterior dominant rhythm which was reactive to eye opening.  There were no epileptiform discharges or focal slowing seen in wakefulness.  During the recording, the patient progresses through wakefulness, drowsiness, and Stage 2 sleep.  Again, there were no epileptiform discharges seen.  Events: On 06/05 at 0148 hours, she reports heart pounding/dizziness. Electrographically, there were no EEG or EKG changes at this time. HR was 90 bpm.  There were no electrographic seizures seen.  EKG lead showed periods of tachycardia from 108 to 126 bpm with no reported symptoms.  IMPRESSION: This 72-hour ambulatory EEG study is normal.    CLINICAL CORRELATION: A normal EEG does not exclude a clinical diagnosis of epilepsy. Episode of dizziness and report of tachycardia did not show EEG correlate. There were periods of asymptomatic tachycardia as noted above. Typical events were not captured.  If further clinical questions remain, inpatient video EEG monitoring may be helpful.   Ellouise Newer, M.D.

## 2017-03-27 ENCOUNTER — Encounter: Payer: Self-pay | Admitting: *Deleted

## 2017-03-27 ENCOUNTER — Telehealth: Payer: Self-pay | Admitting: *Deleted

## 2017-03-27 NOTE — Telephone Encounter (Signed)
-----   Message from Tammie Berthold, DO sent at 03/27/2017 11:14 AM EDT ----- Please inform patient that her EEG is normal.  Continue medications as she is taking. Thanks.

## 2017-03-27 NOTE — Telephone Encounter (Signed)
Result sent via My Chart giving patient results.

## 2017-04-03 ENCOUNTER — Telehealth: Payer: Self-pay | Admitting: Neurology

## 2017-04-03 NOTE — Telephone Encounter (Signed)
Patient needs to know her results of the EEG please call her at [phone]

## 2017-04-03 NOTE — Telephone Encounter (Signed)
EEG is normal and did not show any abnormalities, but this does not exclude seizure disorder, only indicates that none were captured.  I recommend that she continue to take medications as prescribed.

## 2017-04-03 NOTE — Telephone Encounter (Signed)
Please advise 

## 2017-04-05 ENCOUNTER — Telehealth: Payer: Self-pay | Admitting: Nurse Practitioner

## 2017-04-05 NOTE — Telephone Encounter (Signed)
I received a phone call from Sharon at 2:30 pm on Tuesday, June 26th. I was informed that the patient had "passed away in her sleep". I was asked about her cardiac condition which includes a known ascending aortic aneurysm. I was asked to sign the death certificate - but upon further thought, I declined to sign. I do not think the cause of her death is known in this situation and this was relayed to EMS making the call.   Burtis Junes, RN, [facility] [address] [place], [place]  [address] [phone]

## 2017-04-05 NOTE — Telephone Encounter (Signed)
Left message informing patient of no abnormalities and instructed her to continue meds.

## 2017-04-09 DEATH — deceased

## 2017-06-30 ENCOUNTER — Ambulatory Visit: Payer: Self-pay | Admitting: Neurology

## 2017-08-14 IMAGING — US US TRANSVAGINAL NON-OB
1 series · 15 of 25 positions shown · non-contrast
Comparison: 06/13/2015

CLINICAL DATA: Low abdominal pain



[Series 1: us transvaginal non-ob · 15 of 42 slices shown]
[im 1/42]
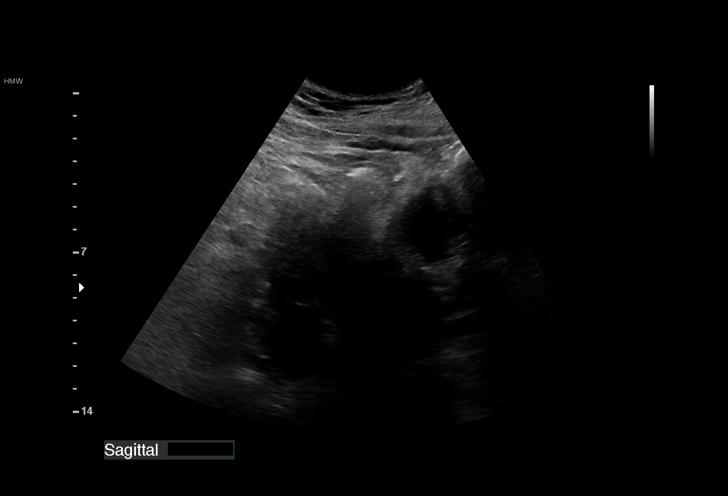
[im 4/42]
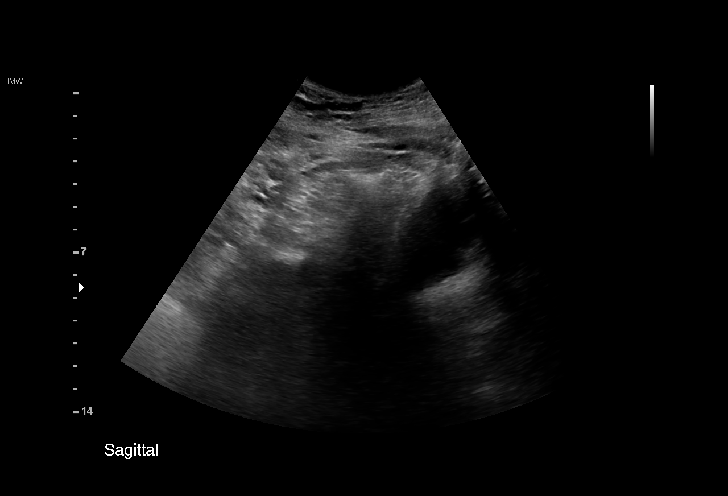
[im 7/42]
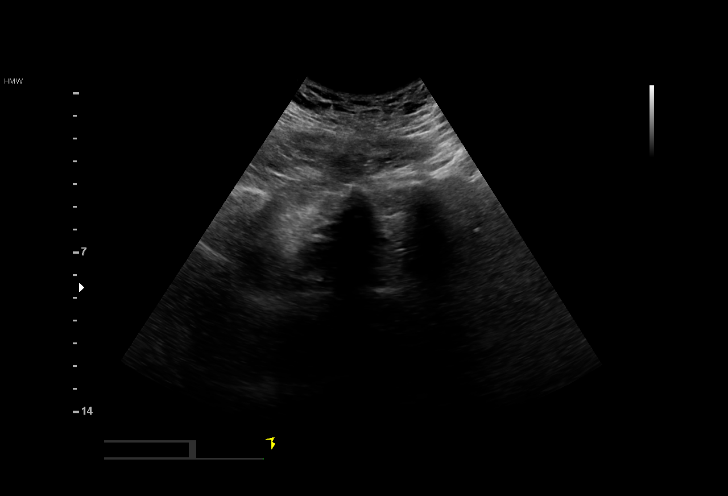
[im 9/42]
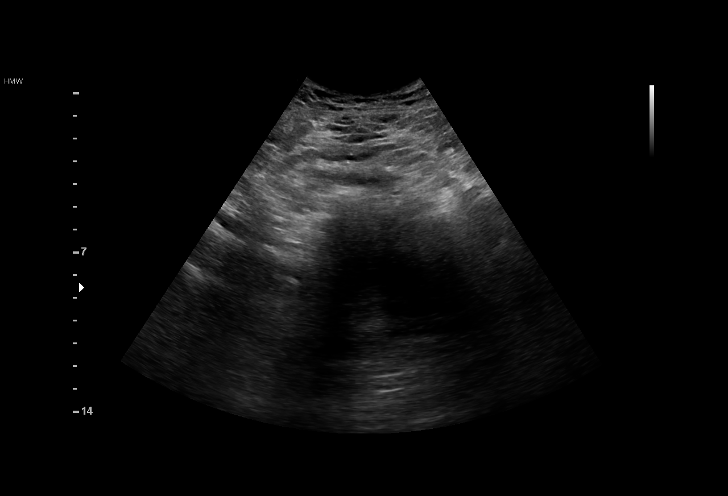
[im 12/42]
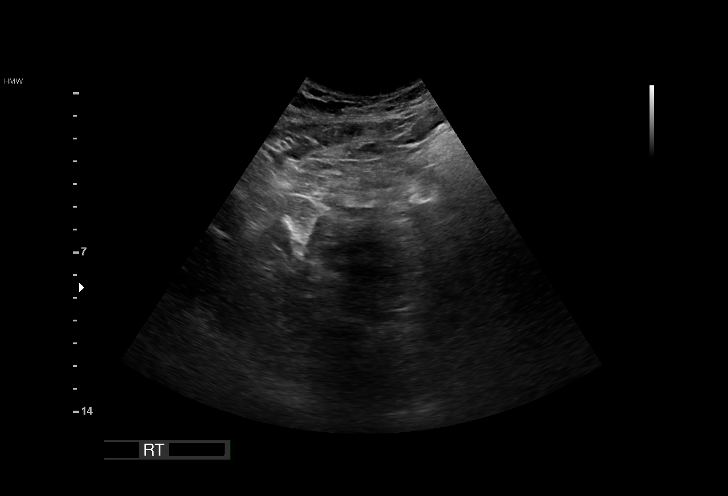
[im 16/42]
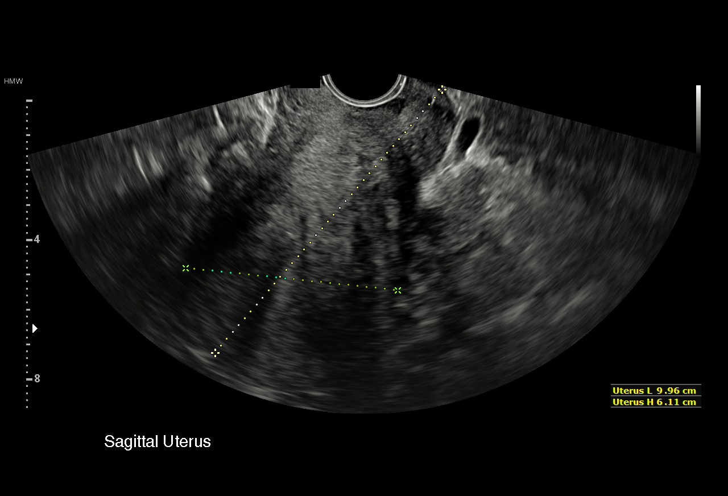
[im 18/42]
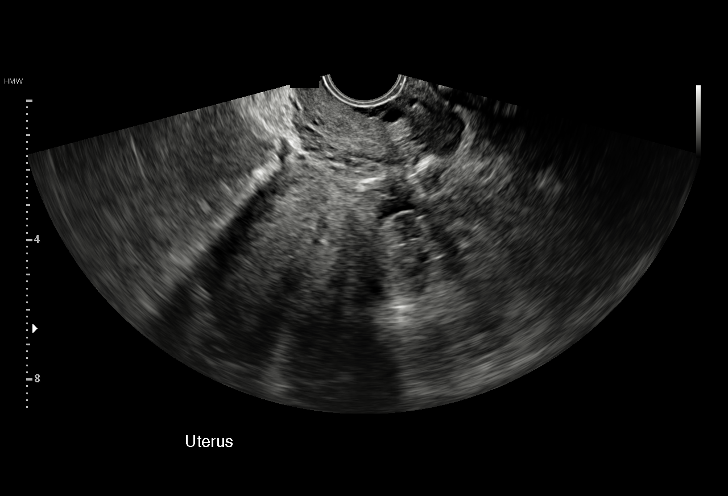
[im 21/42]
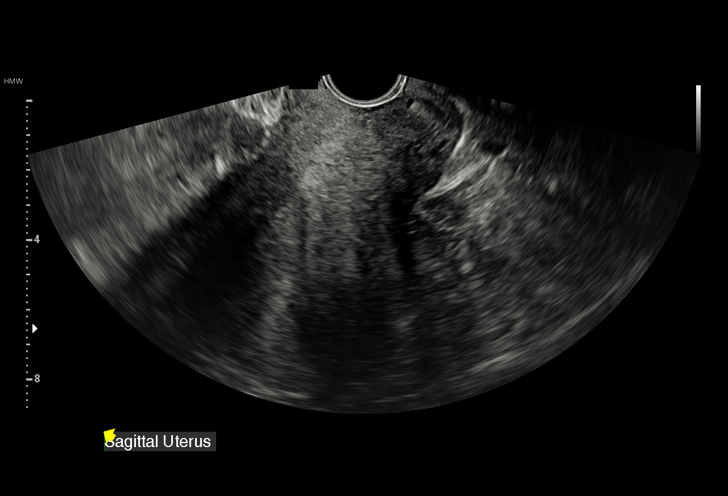
[im 24/42]
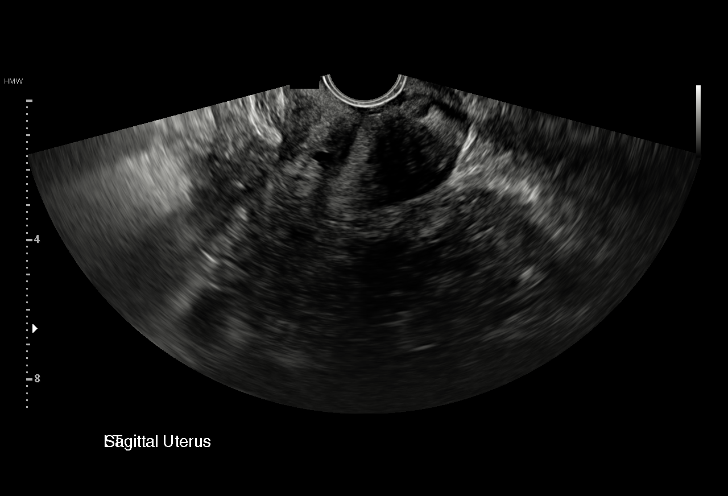
[im 26/42]
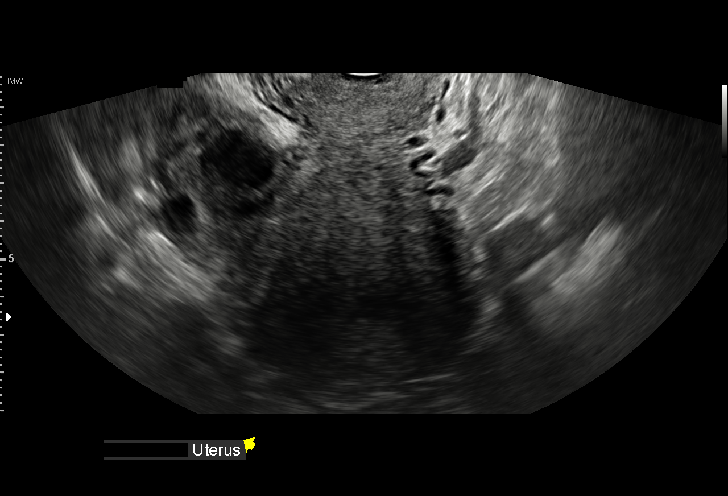
[im 30/42]
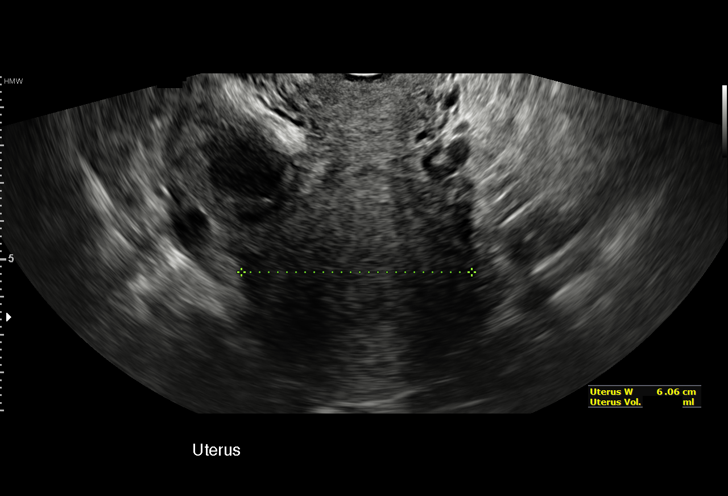
[im 33/42]
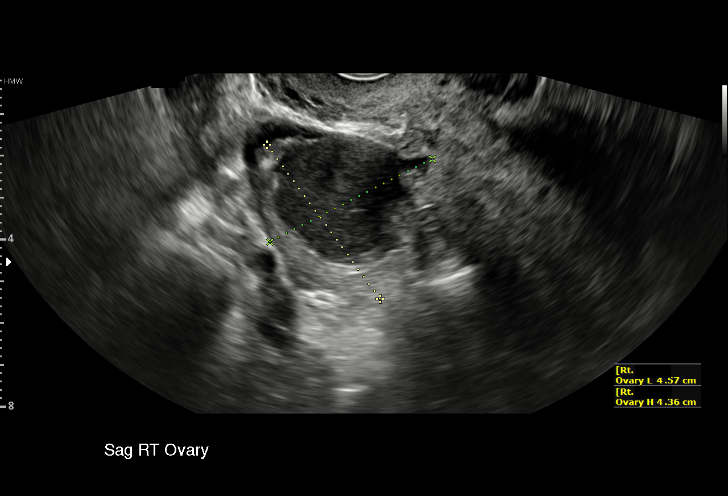
[im 35/42]
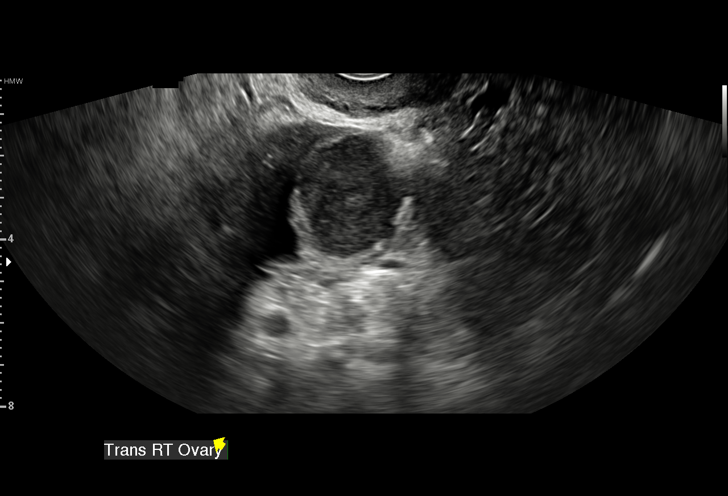
[im 38/42]
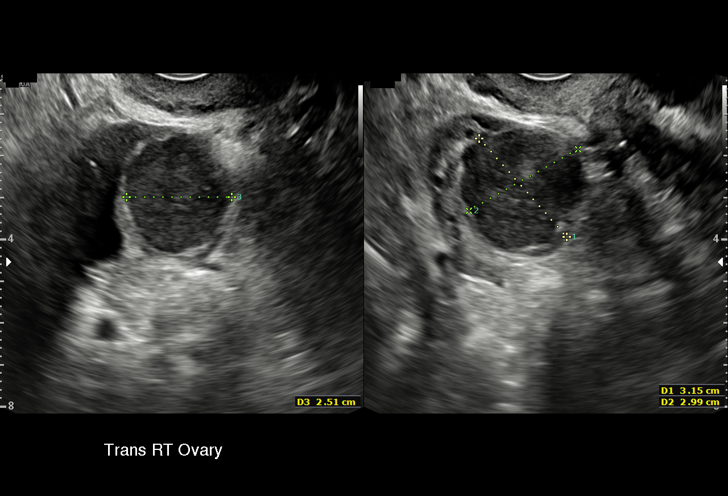
[im 42/42]
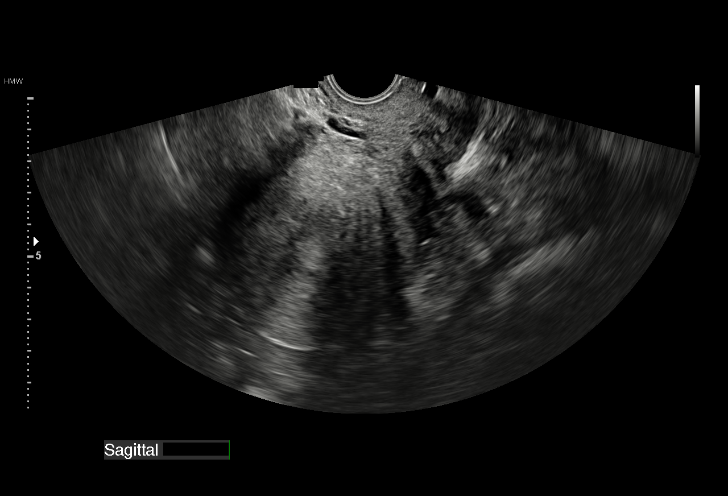

[15 of 25 positions shown; findings below may reference images not displayed]

FINDINGS: Uterus

Measurements: 10.0 x 6.1 x 6.0 cm. No fibroids or other mass
visualized.

Endometrium

Thickness: 5 mm.  No focal abnormality visualized.

Right ovary

Measurements: 4.6 x 4.4 x 4.1 cm. Complex 3.2 cm cyst, probably an
endometrioma or hemorrhagic cyst.

Left ovary

Not visible, probably absent.

Other findings

No abnormal free fluid.
IMPRESSION: Complex 3.2 cm right ovarian lesion, likely a hemorrhagic cyst or
endometrioma. Left ovary is probably absent. Unremarkable uterus.

## 2017-08-23 ENCOUNTER — Ambulatory Visit: Payer: Self-pay | Admitting: Nurse Practitioner
